# Patient Record
Sex: Female | Born: 1940 | Race: White | Hispanic: No | Marital: Married | Smoking: Never smoker
Health system: Southern US, Community
[De-identification: ages and names within clinical notes are randomized; demographics above are authoritative.]

## PROBLEM LIST (undated history)

## (undated) DIAGNOSIS — K219 Gastro-esophageal reflux disease without esophagitis: Secondary | ICD-10-CM

## (undated) DIAGNOSIS — I10 Essential (primary) hypertension: Secondary | ICD-10-CM

## (undated) DIAGNOSIS — I4891 Unspecified atrial fibrillation: Secondary | ICD-10-CM

## (undated) DIAGNOSIS — J45909 Unspecified asthma, uncomplicated: Secondary | ICD-10-CM

## (undated) HISTORY — DX: Unspecified asthma, uncomplicated: J45.909

## (undated) HISTORY — DX: Essential (primary) hypertension: I10

## (undated) HISTORY — DX: Gastro-esophageal reflux disease without esophagitis: K21.9

---
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
## 2014-07-21 HISTORY — PX: HERNIA REPAIR: SHX51

## 2015-08-23 DIAGNOSIS — N950 Postmenopausal bleeding: Secondary | ICD-10-CM

## 2015-08-23 HISTORY — DX: Postmenopausal bleeding: N95.0

## 2016-07-01 DIAGNOSIS — M7752 Other enthesopathy of left foot: Secondary | ICD-10-CM | POA: Insufficient documentation

## 2016-07-01 HISTORY — DX: Other enthesopathy of left foot and ankle: M77.52

## 2016-07-28 ENCOUNTER — Encounter: Payer: Self-pay | Admitting: Allergy and Immunology

## 2016-07-28 ENCOUNTER — Ambulatory Visit (INDEPENDENT_AMBULATORY_CARE_PROVIDER_SITE_OTHER): Payer: Medicare Other | Admitting: Allergy and Immunology

## 2016-07-28 VITALS — BP 138/78 | HR 90 | Temp 97.8°F | Resp 16 | Ht 62.32 in | Wt 173.4 lb

## 2016-07-28 DIAGNOSIS — K219 Gastro-esophageal reflux disease without esophagitis: Secondary | ICD-10-CM

## 2016-07-28 DIAGNOSIS — J4530 Mild persistent asthma, uncomplicated: Secondary | ICD-10-CM | POA: Diagnosis not present

## 2016-07-28 DIAGNOSIS — J3089 Other allergic rhinitis: Secondary | ICD-10-CM | POA: Diagnosis not present

## 2016-07-28 MED ORDER — AMITRIPTYLINE HCL 10 MG PO TABS
10.0000 mg | ORAL_TABLET | Freq: Every day | ORAL | 5 refills | Status: DC
Start: 2016-07-28 — End: 2016-08-25

## 2016-07-28 MED ORDER — ALBUTEROL SULFATE HFA 108 (90 BASE) MCG/ACT IN AERS: INHALATION_SPRAY | RESPIRATORY_TRACT | 1 refills | Status: DC

## 2016-07-28 NOTE — Progress Notes (Signed)
Dear Dr. Sherral Hammers,  Thank you for referring Karen Snyder to the [facility] [place] on 07/28/2016.   Below is a summation of this patient's evaluation and recommendations.  Thank you for your referral. I will keep you informed about this patient's response to treatment.   If you have any questions please do not hesitate to contact me.   Sincerely,  Karen Priest, MD [facility]   ______________________________________________________________________    NEW PATIENT NOTE  Referring Provider: Hadley Pen, MD Primary Provider: Hadley Pen, MD Date of office visit: 07/28/2016    Subjective:   Chief Complaint:  Karen Snyder (DOB: 10-24-1940) is a 76 y.o. female who presents to the clinic on 07/28/2016 with a chief complaint of Cough (wheezing, SOB x 1 year) and Sinus Problem (clear drainage) .     HPI: Karen Snyder presents to this clinic in evaluation of cough. She has a history of cough going back many decades for which she was evaluated in this clinic around 2005 and has had evaluation with a pulmonologist, gastroenterologist, and ear nose and throat physician and has had performance of modified barium swallow, imaging of her chest, imaging of her mid airway, evaluation at [facility], and has had therapy with multiple forms of treatment directed against reflux and inflammation which did not help her tremendously. She does think that the administration of amitriptyline did give rise to a decrease in her cough but when escalating the dose of amitriptyline she ended up with gastrointestinal upset and had to discontinue this medication. She believes that she could tolerate 30 mg without any problem but 40 mg gave her GI upset. As well, she sometimes sips pineapple juice which helps her cough somewhat.  Most recently she has been evaluated by Dr. Sherral Hammers who documented wheezing and placed her on an  inhaled steroid and long-acting bronchodilator and gave her prednisone. She did do better on her prednisone but she does not think that the inhaler helped her very much. At this point she is no longer wheezing and her cough is a little bit better as a result of that therapy.  She still continues to cough. She has coughing spells. She has issues with raspy voice and a lump in her throat. She has clear rhinorrhea. These issues do awaken her at nighttime. She does not have any ugly nasal discharge or anosmia nor does she have chest pain or sputum production or hemoptysis or other respiratory tract symptoms. She does have reflux symptoms all the way up into her throat. She can take Prilosec for a few weeks and then she develops GI upset and then she will come off this medication for several days to a week or so and then restart..  She drinks 3 Cokes per day and chocolate a few times per week and occasionally a Tea. She does not drink any alcohol.  Past Medical History:  Diagnosis Date  . GERD (gastroesophageal reflux disease)   . High blood pressure     Past Surgical History:  Procedure Laterality Date  . ADENOIDECTOMY  1946  . HERNIA REPAIR  2016  . TONSILLECTOMY  1946    Allergies as of 07/28/2016   No Known Allergies     Medication List      amLODipine 5 MG tablet Commonly known as:  NORVASC Take 5 mg by mouth daily.   BIOTIN FORTE PO Take 1 tablet by mouth  daily.   calcium-vitamin D 500-200 MG-UNIT tablet Commonly known as:  OSCAL WITH D Take 1 tablet by mouth daily.   Melatonin 5 MG Caps Take 1 capsule by mouth at bedtime.   MULTI-VITAMIN DAILY PO Take 1 tablet by mouth daily.   omeprazole 20 MG capsule Commonly known as:  PRILOSEC Take 20 mg by mouth daily.   PROBIOTIC ACIDOPHILUS PO Take 1 capsule by mouth daily.       Review of systems negative except as noted in HPI / PMHx or noted below:  Review of Systems  Constitutional: Negative.   HENT: Negative.     Eyes: Negative.   Respiratory: Negative.   Cardiovascular: Negative.   Gastrointestinal: Negative.   Genitourinary: Negative.   Musculoskeletal: Negative.   Skin: Negative.   Neurological: Negative.   Endo/Heme/Allergies: Negative.   Psychiatric/Behavioral: Negative.     Family History  Problem Relation Age of Onset  . Eczema Sister     Social History   Social History  . Marital status: Married    Spouse name: N/A  . Number of children: N/A  . Years of education: N/A   Occupational History  . Not on file.   Social History Main Topics  . Smoking status: Never Smoker  . Smokeless tobacco: Never Used  . Alcohol use No  . Drug use: No  . Sexual activity: Not on file   Other Topics Concern  . Not on file   Social History Narrative  . No narrative on file    Environmental and Social history  He lives in a house with a dry environment, a dog located inside the household, no carpeting in the bedroom, no plastic on the bed or pillow, and no smoking ongoing with inside the household. She is a retired Runner, broadcasting/film/video.  Objective:   Vitals:   07/28/16 1345  BP: 138/78  Pulse: 90  Resp: 16  Temp: 97.8 F (36.6 C)   Height: 5' 2.32" (158.3 cm) Weight: 173 lb 6.4 oz (78.7 kg)  Physical Exam  Constitutional: She is well-developed, well-nourished, and in no distress.  Coughing  HENT:  Head: Normocephalic.  Right Ear: Tympanic membrane, external ear and ear canal normal.  Left Ear: Tympanic membrane, external ear and ear canal normal.  Nose: Nose normal. No mucosal edema or rhinorrhea.  Mouth/Throat: Uvula is midline, oropharynx is clear and moist and mucous membranes are normal. No oropharyngeal exudate.  Eyes: Conjunctivae are normal.  Neck: Trachea normal. No tracheal tenderness present. No tracheal deviation present. No thyromegaly present.  Cardiovascular: Normal rate, regular rhythm, S1 normal, S2 normal and normal heart sounds.   No murmur  heard. Pulmonary/Chest: Breath sounds normal. No stridor. No respiratory distress. She has no wheezes. She has no rales.  Musculoskeletal: She exhibits no edema.  Lymphadenopathy:       Head (right side): No tonsillar adenopathy present.       Head (left side): No tonsillar adenopathy present.    She has no cervical adenopathy.  Neurological: She is alert. Gait normal.  Skin: No rash noted. She is not diaphoretic. No erythema. Nails show no clubbing.  Psychiatric: Mood and affect normal.    Diagnostics: Allergy skin tests were not performed.   Spirometry was performed and demonstrated an FEV1 of 2.08 @ 111 % of predicted. Following the administration of nebulized albuterol her FEV1 did not improve.  Assessment and Plan:    1. Asthma, well controlled, mild persistent   2. LPRD (laryngopharyngeal reflux disease)  3. Other allergic rhinitis     1. Treat reflux:   A. consolidate caffeine and chocolate consumption as much as possible slowly  B. samples of Dexilant 30 mg capsule in a.m.  C. OTC ranitidine 150 mg in PM  2. Treat inflammation:   A. sample Asmanex 200 HFA - 2 inhalations one time per day with spacer  B. OTC Rhinocort - one spray each nostril once a day  3. Decrease cough reflex:   A. amitriptyline 10 mg tablet at bedtime  4. If needed:   A. Proventil HFA 2 puffs every 4-6 hours  B. OTC antihistamine  C. OTC Mucinex DM  5. 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 We will treat her with the therapy mentioned above and make a decision about further evaluation and treatment pending her response over the course of the next 4 weeks.  Karen PriestEric J. Taren Dymek, MD [facility] [place]

## 2016-07-28 NOTE — Patient Instructions (Addendum)
  1. Treat reflux:   A. consolidate caffeine and chocolate consumption as much as possible slowly  B. samples of Dexilant 30 mg capsule in a.m.  C. OTC ranitidine 150 mg in PM  2. Treat inflammation:   A. sample Asmanex 200 HFA - 2 inhalations one time per day with spacer  B. OTC Rhinocort - one spray each nostril once a day  3. Decrease cough reflex:   A. amitriptyline 10 mg tablet at bedtime  4. If needed:   A. Proventil HFA 2 puffs every 4-6 hours  B. OTC antihistamine  C. OTC Mucinex DM  5. Return to clinic in 4 weeks or earlier if problem

## 2016-08-25 ENCOUNTER — Ambulatory Visit (INDEPENDENT_AMBULATORY_CARE_PROVIDER_SITE_OTHER): Payer: Medicare Other | Admitting: Allergy and Immunology

## 2016-08-25 ENCOUNTER — Encounter: Payer: Self-pay | Admitting: Allergy and Immunology

## 2016-08-25 VITALS — BP 128/76 | HR 100 | Resp 20

## 2016-08-25 DIAGNOSIS — J4530 Mild persistent asthma, uncomplicated: Secondary | ICD-10-CM

## 2016-08-25 DIAGNOSIS — J3089 Other allergic rhinitis: Secondary | ICD-10-CM | POA: Diagnosis not present

## 2016-08-25 DIAGNOSIS — K219 Gastro-esophageal reflux disease without esophagitis: Secondary | ICD-10-CM

## 2016-08-25 MED ORDER — DEXLANSOPRAZOLE 30 MG PO CPDR: DELAYED_RELEASE_CAPSULE | ORAL | 5 refills | Status: DC

## 2016-08-25 MED ORDER — MOMETASONE FUROATE 200 MCG/ACT IN AERO: 2.0000 | INHALATION_SPRAY | Freq: Every day | RESPIRATORY_TRACT | 5 refills | Status: DC

## 2016-08-25 MED ORDER — RANITIDINE HCL 150 MG PO TABS: ORAL_TABLET | ORAL | 5 refills | Status: DC

## 2016-08-25 MED ORDER — AMITRIPTYLINE HCL 10 MG PO TABS: ORAL_TABLET | ORAL | 5 refills | Status: DC

## 2016-08-25 NOTE — Patient Instructions (Addendum)
  1. Continue to Treat reflux:   A. consolidate caffeine and chocolate consumption    B. Dexilant 30 mg capsule in a.m.  C. increase ranitidine 150 mg 2 tablets in PM (script)  2. Continue to Treat inflammation:   A. Asmanex 200 HFA - 2 inhalations 1 time per day with spacer  B. OTC Rhinocort - one spray each nostril once a day  3. Continue to Decrease cough reflex:   A. increase amitriptyline 10 mg tablet 2 tablets at bedtime  4. If needed:   A. Proventil HFA 2 puffs every 4-6 hours  B. OTC antihistamine  C. OTC Mucinex DM  5. Return to clinic in 8 weeks or earlier if problem

## 2016-08-25 NOTE — Progress Notes (Signed)
Follow-up Note  Referring Provider: Hadley Pen, MD Primary Provider: Hadley Pen, MD Date of Office Visit: 08/25/2016  Subjective:   Karen Snyder (DOB: 06-18-1941) is a 76 y.o. female who [facility] on 08/25/2016 in re-evaluation of the following:  HPI: Karen Snyder returns to this clinic in reevaluation of her multifactorial form of cough believed secondary to a combination of reflux-induced respiratory disease, allergic rhinitis, and possible component of asthma. I last saw her in this clinic in a January 2018 at which time we established a plan to address each one of these issues.  She has resolved her coughing fits although she still has a little bit of a daily cough. She has no problems with her throat at this point in time. She has no reflux symptoms. She does not need to use a bronchodilator.  Karen Snyder been very good about consistently using her medical therapy as previously prescribed which includes the Dexilant and ranitidine, Asmanex and Rhinocort, and amitriptyline at bedtime. She has not been as good about consolidating her caffeine consumption and she still continues to drink cokes about 3 times per day and occasionally tea when she goes out. She has consolidated her chocolate. She has raised the head of the bed.  Allergies as of 08/25/2016   No Known Allergies     Medication List      albuterol 108 (90 Base) MCG/ACT inhaler Commonly known as:  PROVENTIL HFA Inhale two puffs every four to six hours as needed for cough or wheeze.   amitriptyline 10 MG tablet Commonly known as:  ELAVIL Take 1 tablet (10 mg total) by mouth at bedtime.   amLODipine 5 MG tablet Commonly known as:  NORVASC Take 5 mg by mouth daily.   ASMANEX HFA 200 MCG/ACT Aero Generic drug:  Mometasone Furoate Inhale 2 puffs into the lungs daily.   BIOTIN FORTE PO Take 1 tablet by mouth daily.   calcium-vitamin D 500-200 MG-UNIT tablet Commonly known as:   OSCAL WITH D Take 1 tablet by mouth daily.   DEXILANT 60 MG capsule Generic drug:  dexlansoprazole Take 60 mg by mouth every morning.   Melatonin 5 MG Caps Take 1 capsule by mouth at bedtime.   MULTI-VITAMIN DAILY PO Take 1 tablet by mouth daily.   PROBIOTIC ACIDOPHILUS PO Take 1 capsule by mouth daily.   ranitidine 150 MG tablet Commonly known as:  ZANTAC Take 150 mg by mouth at bedtime.   RHINOCORT ALLERGY 32 MCG/ACT nasal spray Generic drug:  budesonide Place 1 spray into both nostrils daily.       Past Medical History:  Diagnosis Date  . Asthma   . GERD (gastroesophageal reflux disease)   . High blood pressure     Past Surgical History:  Procedure Laterality Date  . ADENOIDECTOMY  1946  . HERNIA REPAIR  2016  . TONSILLECTOMY  1946    Review of systems negative except as noted in HPI / PMHx or noted below:  Review of Systems  Constitutional: Negative.   HENT: Negative.   Eyes: Negative.   Respiratory: Negative.   Cardiovascular: Negative.   Gastrointestinal: Negative.   Genitourinary: Negative.   Musculoskeletal: Negative.   Skin: Negative.   Neurological: Negative.   Endo/Heme/Allergies: Negative.   Psychiatric/Behavioral: Negative.      Objective:   Vitals:   08/25/16 1142  BP: 128/76  Pulse: 100  Resp: 20          Physical  Exam  Constitutional: She is well-developed, well-nourished, and in no distress.  HENT:  Head: Normocephalic.  Right Ear: Tympanic membrane, external ear and ear canal normal.  Left Ear: Tympanic membrane, external ear and ear canal normal.  Nose: Nose normal. No mucosal edema or rhinorrhea.  Mouth/Throat: Uvula is midline, oropharynx is clear and moist and mucous membranes are normal. No oropharyngeal exudate.  Eyes: Conjunctivae are normal.  Neck: Trachea normal. No tracheal tenderness present. No tracheal deviation present. No thyromegaly present.  Cardiovascular: Normal rate, regular rhythm, S1 normal, S2  normal and normal heart sounds.   No murmur heard. Pulmonary/Chest: Breath sounds normal. No stridor. No respiratory distress. She has no wheezes. She has no rales.  Musculoskeletal: She exhibits no edema.  Lymphadenopathy:       Head (right side): No tonsillar adenopathy present.       Head (left side): No tonsillar adenopathy present.    She has no cervical adenopathy.  Neurological: She is alert. Gait normal.  Skin: No rash noted. She is not diaphoretic. No erythema. Nails show no clubbing.  Psychiatric: Mood and affect normal.    Diagnostics:    Spirometry was performed and demonstrated an FEV1 of 1.53 at 82 % of predicted.  Assessment and Plan:   1. Asthma, well controlled, mild persistent   2. Other allergic rhinitis   3. LPRD (laryngopharyngeal reflux disease)     1. Continue to Treat reflux:   A. consolidate caffeine and chocolate consumption    B. Dexilant 30 mg capsule in a.m.  C. increase ranitidine 150 mg 2 tablets in PM (script)  2. Continue to Treat inflammation:   A. Asmanex 200 HFA - 2 inhalations 1 time per day with spacer  B. OTC Rhinocort - one spray each nostril once a day  3. Continue to Decrease cough reflex:   A. increase amitriptyline 10 mg tablet 2 tablets at bedtime  4. If needed:   A. Proventil HFA 2 puffs every 4-6 hours  B. OTC antihistamine  C. OTC Mucinex DM  5. Return to clinic in 8 weeks or earlier if problem  Karen Snyder's initial response to medical therapy over the course the past 4 weeks has been favorable. I think we are beginning to make some headway regarding her chronic cough. She will continue to use the therapy mentioned above with some slight increases in her therapy for reflux and for cough suppression. I'll see her back in this clinic in 8 weeks and we will make a determination about how to proceed at that point pending her response. She'll contact me during the interval should there be a significant problem.  Karen SchimkeEric Gianna Calef,  MD Allergy / Immunology [facility]

## 2016-08-28 ENCOUNTER — Other Ambulatory Visit: Payer: Self-pay | Admitting: *Deleted

## 2016-08-28 MED ORDER — FLUTICASONE PROPIONATE HFA 110 MCG/ACT IN AERO: INHALATION_SPRAY | RESPIRATORY_TRACT | 5 refills | Status: DC

## 2016-09-01 ENCOUNTER — Telehealth: Payer: Self-pay

## 2016-09-01 NOTE — Telephone Encounter (Signed)
Pharmacy called, Asmanex not covered. Alternatives include  . Dulera  Tier 3 .  Pulmicort  Tier 3 .  Symbicort  Tier 3  Change?

## 2016-09-01 NOTE — Telephone Encounter (Signed)
Please provide patient Pulmicort 180 two inhalations one time per day. Will require instruction on use.

## 2016-09-01 NOTE — Telephone Encounter (Signed)
Already changed patient to Flovent on 02/08/018. Rx sent

## 2016-10-20 ENCOUNTER — Ambulatory Visit (INDEPENDENT_AMBULATORY_CARE_PROVIDER_SITE_OTHER): Payer: Medicare Other | Admitting: Allergy and Immunology

## 2016-10-20 ENCOUNTER — Encounter: Payer: Self-pay | Admitting: Allergy and Immunology

## 2016-10-20 VITALS — BP 130/78 | HR 100 | Resp 18

## 2016-10-20 DIAGNOSIS — J3089 Other allergic rhinitis: Secondary | ICD-10-CM

## 2016-10-20 DIAGNOSIS — K219 Gastro-esophageal reflux disease without esophagitis: Secondary | ICD-10-CM

## 2016-10-20 DIAGNOSIS — J4530 Mild persistent asthma, uncomplicated: Secondary | ICD-10-CM | POA: Diagnosis not present

## 2016-10-20 NOTE — Patient Instructions (Addendum)
  1. Continue to Treat reflux:   A. consolidate caffeine and chocolate consumption    B. Dexilant 30 mg capsule in a.m.  C. ranitidine 150 mg 2 tablets in PM   2. Continue to Treat inflammation:   A. Asmanex 200 HFA - 2 inhalations 1 time per day with spacer  B. OTC Rhinocort - one spray each nostril once a day  C. prednisone 10 mg tablet 1 time per day for 10 days only  3. Continue to Decrease cough reflex:   A. amitriptyline 10 mg tablet 2 tablets at bedtime  4. If needed:   A. Proventil HFA 2 puffs every 4-6 hours  B. OTC antihistamine  C. OTC Mucinex DM  5. Return to clinic in Summer 2018 or earlier if problem

## 2016-10-20 NOTE — Progress Notes (Signed)
Follow-up Note  Referring Provider: Hadley Pen, MD Primary Provider: Hadley Pen, MD Date of Office Visit: 10/20/2016  Subjective:   Karen Snyder (DOB: 1940-08-09) is a 76 y.o. female who [facility] the [facility] on 10/20/2016 in re-evaluation of the following:  HPI: Karen Snyder presents to this clinic in reevaluation of her asthma, allergic rhinitis, and reflux-induced respiratory disease. I last saw her in his clinic in February 2018.  She did very well regarding all of her respiratory tract symptoms and had no cough until she and her husband contracted a viral respiratory tract infection with nasal congestion and rhinorrhea and coughing. Although she is dramatically improved from that event she still has a little bit of lingering cough over the course of the past 2-3 weeks. She's never had a fever and no ugly nasal discharge or sputum production or chest pain.  Prior to this point she was doing wonderful and was not consuming any caffeine and was consistently treating her reflux and consistently using anti-inflammatory medications for both her upper and lower airway.  Allergies as of 10/20/2016   No Known Allergies     Medication List      albuterol 108 (90 Base) MCG/ACT inhaler Commonly known as:  PROVENTIL HFA Inhale two puffs every four to six hours as needed for cough or wheeze.   amitriptyline 10 MG tablet Commonly known as:  ELAVIL Take two tablets at bedtime daily   amLODipine 5 MG tablet Commonly known as:  NORVASC Take 5 mg by mouth daily.   ASMANEX HFA 200 MCG/ACT Aero Generic drug:  Mometasone Furoate Inhale 2 puffs into the lungs daily. Rinse, gargle, and spit after use.   BIOTIN FORTE PO Take 1 tablet by mouth daily.   calcium-vitamin D 500-200 MG-UNIT tablet Commonly known as:  OSCAL WITH D Take 1 tablet by mouth daily.   Dexlansoprazole 30 MG capsule Commonly known as:  DEXILANT Take one capsule every morning before  breakfast   Melatonin 5 MG Caps Take 1 capsule by mouth at bedtime.   MULTI-VITAMIN DAILY PO Take 1 tablet by mouth daily.   PROBIOTIC ACIDOPHILUS PO Take 1 capsule by mouth daily.   ranitidine 150 MG tablet Commonly known as:  ZANTAC Take two tablets each evening as directed   RHINOCORT ALLERGY 32 MCG/ACT nasal spray Generic drug:  budesonide Place 1 spray into both nostrils daily.       Past Medical History:  Diagnosis Date  . Asthma   . GERD (gastroesophageal reflux disease)   . High blood pressure     Past Surgical History:  Procedure Laterality Date  . ADENOIDECTOMY  1946  . HERNIA REPAIR  2016  . TONSILLECTOMY  1946    Review of systems negative except as noted in HPI / PMHx or noted below:  Review of Systems  Constitutional: Negative.   HENT: Negative.   Eyes: Negative.   Respiratory: Negative.   Cardiovascular: Negative.   Gastrointestinal: Negative.   Genitourinary: Negative.   Musculoskeletal: Negative.   Skin: Negative.   Neurological: Negative.   Endo/Heme/Allergies: Negative.   Psychiatric/Behavioral: Negative.      Objective:   Vitals:   10/20/16 1104  BP: 130/78  Pulse: 100  Resp: 18          Physical Exam  Constitutional: She is well-developed, well-nourished, and in no distress.  HENT:  Head: Normocephalic.  Right Ear: Tympanic membrane, external ear and ear canal normal.  Left Ear: Tympanic  membrane, external ear and ear canal normal.  Nose: Nose normal. No mucosal edema or rhinorrhea.  Mouth/Throat: Uvula is midline, oropharynx is clear and moist and mucous membranes are normal. No oropharyngeal exudate.  Eyes: Conjunctivae are normal.  Neck: Trachea normal. No tracheal tenderness present. No tracheal deviation present. No thyromegaly present.  Cardiovascular: Normal rate, regular rhythm, S1 normal, S2 normal and normal heart sounds.   No murmur heard. Pulmonary/Chest: Breath sounds normal. No stridor. No respiratory  distress. She has no wheezes. She has no rales.  Musculoskeletal: She exhibits no edema.  Lymphadenopathy:       Head (right side): No tonsillar adenopathy present.       Head (left side): No tonsillar adenopathy present.    She has no cervical adenopathy.  Neurological: She is alert. Gait normal.  Skin: No rash noted. She is not diaphoretic. No erythema. Nails show no clubbing.  Psychiatric: Mood and affect normal.    Diagnostics:    Spirometry was performed and demonstrated an FEV1 of 1.43 at 76 % of predicted  Assessment and Plan:   1. Asthma, well controlled, mild persistent   2. Other allergic rhinitis   3. LPRD (laryngopharyngeal reflux disease)     1. Continue to Treat reflux:   A. consolidate caffeine and chocolate consumption    B. Dexilant 30 mg capsule in a.m.  C. ranitidine 150 mg 2 tablets in PM   2. Continue to Treat inflammation:   A. Asmanex 200 HFA - 2 inhalations 1 time per day with spacer  B. OTC Rhinocort - one spray each nostril once a day  C. prednisone 10 mg tablet 1 time per day for 10 days only  3. Continue to Decrease cough reflex:   A. amitriptyline 10 mg tablet 2 tablets at bedtime  4. If needed:   A. Proventil HFA 2 puffs every 4-6 hours  B. OTC antihistamine  C. OTC Mucinex DM  5. Return to clinic in Summer 2018 or earlier if problem  Karen Snyder appears to be doing very good except for the fact that she did contract a viral respiratory tract infection and I will give her some systemic steroids to address this issue utilizing a very low dose over the course the next 10 days. She'll continue to treat her reflux aggressively and also continue on anti-inflammatory medications for her respiratory tract utilized in a preventative basis and also uses amitriptyline to address her increased cough reflex. I'll see her back in this clinic in the summer of 2018 or earlier if there is a problem.  Laurette Schimke, MD Allergy / Immunology [facility]

## 2017-02-09 ENCOUNTER — Ambulatory Visit: Payer: Medicare Other | Admitting: Allergy and Immunology

## 2017-02-18 ENCOUNTER — Ambulatory Visit (INDEPENDENT_AMBULATORY_CARE_PROVIDER_SITE_OTHER): Payer: Medicare Other | Admitting: Allergy and Immunology

## 2017-02-18 ENCOUNTER — Encounter: Payer: Self-pay | Admitting: Allergy and Immunology

## 2017-02-18 VITALS — BP 126/74 | HR 92 | Resp 16

## 2017-02-18 DIAGNOSIS — K219 Gastro-esophageal reflux disease without esophagitis: Secondary | ICD-10-CM

## 2017-02-18 DIAGNOSIS — J4530 Mild persistent asthma, uncomplicated: Secondary | ICD-10-CM | POA: Diagnosis not present

## 2017-02-18 DIAGNOSIS — J3089 Other allergic rhinitis: Secondary | ICD-10-CM | POA: Diagnosis not present

## 2017-02-18 NOTE — Progress Notes (Signed)
Follow-up Note  Referring Provider: Hadley Penobbins, Robert A, MD Primary Provider: Hadley Penobbins, Robert A, MD Date of Office Visit: 02/18/2017  Subjective:   Karen Snyder (DOB: Dec 07, 1940) is a 76 y.o. female who [facility] in re-evaluation of the following:  HPI:  Karen Sineancy presents to this clinic in reevaluation of her asthma and allergic rhinitis and reflux-induced respiratory disease. Her last visit to this clinic was February 2018.  Although she has improved significantly while treating inflammation of her respiratory tract and reflux-induced respiratory disease he still has some lingering issues. She still wakes up in the morning and has a morning cough to clear out her chest phlegm. As well, she was out of Asmanex for about 10 days and she developed significant cough during that timeframe and since she has restarted her Asmanex over the course of the past 10 days she has noticed a dramatic decrease in her cough. She has no classic reflux symptoms and no problems with her throat or nose at this point. She does have a diminished ability to exercise in that she does get short of breath.  She did have problems with feet swelling for which she visited with Dr. Sherral Hammersobbins who performed blood tests and apparently everything was okay. She is on amlodipine. She has no symptoms to suggest significant congestive heart failure.  Allergies as of 02/18/2017   No Known Allergies     Medication List      albuterol 108 (90 Base) MCG/ACT inhaler Commonly known as:  PROVENTIL HFA Inhale two puffs every four to six hours as needed for cough or wheeze.   amitriptyline 10 MG tablet Commonly known as:  ELAVIL Take two tablets at bedtime daily   amLODipine 5 MG tablet Commonly known as:  NORVASC Take 5 mg by mouth daily.   ASMANEX HFA 200 MCG/ACT Aero Generic drug:  Mometasone Furoate Inhale 2 puffs into the lungs daily. Rinse, gargle, and spit after use.   BIOTIN  FORTE PO Take 1 tablet by mouth daily.   calcium-vitamin D 500-200 MG-UNIT tablet Commonly known as:  OSCAL WITH D Take 1 tablet by mouth daily.   Dexlansoprazole 30 MG capsule Commonly known as:  DEXILANT Take one capsule every morning before breakfast   MULTI-VITAMIN DAILY PO Take 1 tablet by mouth daily.   PROBIOTIC ACIDOPHILUS PO Take 1 capsule by mouth daily.   ranitidine 150 MG tablet Commonly known as:  ZANTAC Take two tablets each evening as directed   RHINOCORT ALLERGY 32 MCG/ACT nasal spray Generic drug:  budesonide Place 1 spray into both nostrils daily.       Past Medical History:  Diagnosis Date  . Asthma   . GERD (gastroesophageal reflux disease)   . High blood pressure     Past Surgical History:  Procedure Laterality Date  . ADENOIDECTOMY  1946  . HERNIA REPAIR  2016  . TONSILLECTOMY  1946    Review of systems negative except as noted in HPI / PMHx or noted below:  Review of Systems  Constitutional: Negative.   HENT: Negative.   Eyes: Negative.   Respiratory: Negative.   Cardiovascular: Negative.   Gastrointestinal: Negative.   Genitourinary: Negative.   Musculoskeletal: Negative.   Skin: Negative.   Neurological: Negative.   Endo/Heme/Allergies: Negative.   Psychiatric/Behavioral: Negative.      Objective:   Vitals:   02/18/17 1514  BP: 126/74  Pulse: 92  Resp: 16  Physical Exam  Constitutional: She is well-developed, well-nourished, and in no distress.  Snyder throat clearing, Snyder cough  HENT:  Head: Normocephalic.  Right Ear: Tympanic membrane, external ear and ear canal normal.  Left Ear: Tympanic membrane, external ear and ear canal normal.  Nose: Nose normal. No mucosal edema or rhinorrhea.  Mouth/Throat: Uvula is midline, oropharynx is clear and moist and mucous membranes are normal. No oropharyngeal exudate.  Eyes: Conjunctivae are normal.  Neck: Trachea normal. No tracheal tenderness present. No  tracheal deviation present. No thyromegaly present.  Cardiovascular: Normal rate, regular rhythm, S1 normal, S2 normal and normal heart sounds.   No murmur heard. Pulmonary/Chest: Breath sounds normal. No stridor. No respiratory distress. She has no wheezes. She has no rales.  Musculoskeletal: She exhibits no edema.  Lymphadenopathy:       Head (right side): No tonsillar adenopathy present.       Head (left side): No tonsillar adenopathy present.    She has no cervical adenopathy.  Neurological: She is alert. Gait normal.  Skin: No rash noted. She is not diaphoretic. No erythema. Nails show no clubbing.  Psychiatric: Mood and affect normal.    Diagnostics:    Spirometry was performed and demonstrated an FEV1 of 1.45 at 78 % of predicted.  The patient had an Asthma Control Test with the following results: ACT Total Score: 24.    Assessment and Plan:   1. Not well controlled mild persistent asthma   2. Other allergic rhinitis   3. LPRD (laryngopharyngeal reflux disease)     1. Continue to Treat reflux:   A. consolidate caffeine and chocolate consumption    B. Dexilant 30 mg capsule in a.m.  C. ranitidine 150 mg 2 tablets in PM   2. Continue to Treat inflammation:   A. Increase Asmanex 200 HFA - 2 inhalations 2 time per day with spacer  B. OTC Rhinocort - one spray each nostril once a day  3. Continue to Decrease cough reflex:   A. amitriptyline 10 mg tablet 2 tablets at bedtime  4. If needed:   A. Proventil HFA 2 puffs every 4-6 hours  B. OTC antihistamine  C. OTC Mucinex DM  5. Return to clinic in 12 weeks or earlier if problem  6. Obtain fall flu vaccine  I'm going to have Karen Snyder increase her dose of Asmanex assuming that there is a inflammatory component that has not been addressed that gives rise to some of her lingering respiratory tract symptoms. This assumption is somewhat supported by the fact that when she stopped her Asmanex for about 2 weeks she develop  significant cough. She will continue to use other therapy directed against respiratory tract inflammation and reflux. I will regroup with her in 12 weeks or earlier if there is a problem.  Laurette SchimkeEric Kozlow, MD Allergy / Immunology [facility]

## 2017-02-18 NOTE — Patient Instructions (Addendum)
  1. Continue to Treat reflux:   A. consolidate caffeine and chocolate consumption    B. Dexilant 30 mg capsule in a.m.  C. ranitidine 150 mg 2 tablets in PM   2. Continue to Treat inflammation:   A. Increase Asmanex 200 HFA - 2 inhalations 2 time per day with spacer  B. OTC Rhinocort - one spray each nostril once a day  3. Continue to Decrease cough reflex:   A. amitriptyline 10 mg tablet 2 tablets at bedtime  4. If needed:   A. Proventil HFA 2 puffs every 4-6 hours  B. OTC antihistamine  C. OTC Mucinex DM  5. Return to clinic in 12 weeks or earlier if problem  6. Obtain fall flu vaccine

## 2017-02-21 ENCOUNTER — Other Ambulatory Visit: Payer: Self-pay | Admitting: Allergy and Immunology

## 2017-03-19 ENCOUNTER — Telehealth: Payer: Self-pay | Admitting: *Deleted

## 2017-03-19 NOTE — Telephone Encounter (Signed)
Patient made appt to come in to see with Dr Lucie LeatherKozlow in 1 couple weeks but having some nausea issues mainly in the am thinks maybe reflux med.  Per discussion she is going D/C the ranitidine at night an see if hser symptoms resolve.  She advises that she had to D/C her amitriptyline due to caused her feet to swell.  She stopped the medication for 1 week and swelling stopped and when she restarted Rx swelling started again.

## 2017-04-09 ENCOUNTER — Ambulatory Visit (INDEPENDENT_AMBULATORY_CARE_PROVIDER_SITE_OTHER): Payer: Medicare Other | Admitting: Allergy and Immunology

## 2017-04-09 ENCOUNTER — Encounter: Payer: Self-pay | Admitting: Allergy and Immunology

## 2017-04-09 VITALS — BP 138/82 | HR 84 | Resp 20

## 2017-04-09 DIAGNOSIS — K219 Gastro-esophageal reflux disease without esophagitis: Secondary | ICD-10-CM

## 2017-04-09 DIAGNOSIS — J3089 Other allergic rhinitis: Secondary | ICD-10-CM | POA: Diagnosis not present

## 2017-04-09 DIAGNOSIS — J4530 Mild persistent asthma, uncomplicated: Secondary | ICD-10-CM

## 2017-04-09 MED ORDER — GABAPENTIN 100 MG PO CAPS: 100.0000 mg | ORAL_CAPSULE | Freq: Two times a day (BID) | ORAL | 3 refills | Status: DC

## 2017-04-09 NOTE — Progress Notes (Signed)
Follow-up Note  Referring Provider: Hadley Pen, MD Primary Provider: Hadley Pen, MD Date of Office Visit: 04/09/2017  Subjective:   Karen Snyder (DOB: [birth year]) is a 76 y.o. female who [facility] in re-evaluation of the following:  HPI: Karen Snyder returns to this clinic in reevaluation of her persistent cough. Her last visit to this clinic was 02/18/2017 at which point in time she had very good control of her cough while consistently using a combination of therapy directed against inflammation and reflux and using amitriptyline as a cough suppressant.  Unfortunately, she had to discontinue her amitriptyline because it was giving rise to hair loss and leg swelling. As well, she had to discontinue her ranitidine because it was giving rise to abdominal pain and burning in her chest. Since she discontinued her amitriptyline and ranitidine these side effects have resolved. However, since she discontinued her amitriptyline and ranitidine she has developed very bad cough associated with posttussive emesis. She has lots of throat clearing and feels as though there is phlegm stuck in her throat.  Allergies as of 04/09/2017   No Known Allergies     Medication List      albuterol 108 (90 Base) MCG/ACT inhaler Commonly known as:  PROVENTIL HFA Inhale two puffs every four to six hours as needed for cough or wheeze.   amitriptyline 10 MG tablet Commonly known as:  ELAVIL TAKE 2 TABLETS DAILY AT BEDTIME.   amLODipine 5 MG tablet Commonly known as:  NORVASC Take 5 mg by mouth daily.   ASMANEX HFA 200 MCG/ACT Aero Generic drug:  Mometasone Furoate Inhale 2 puffs into the lungs daily. Rinse, gargle, and spit after use.   BIOTIN FORTE PO Take 1 tablet by mouth daily.   calcium-vitamin D 500-200 MG-UNIT tablet Commonly known as:  OSCAL WITH D Take 1 tablet by mouth daily.   DEXILANT 30 MG capsule Generic drug:  Dexlansoprazole TAKE  1 CAPSULE EVERY MORNING BEFORE BREAKFAST   dextromethorphan-guaiFENesin 30-600 MG 12hr tablet Commonly known as:  MUCINEX DM Take by mouth.   MULTI-VITAMIN DAILY PO Take 1 tablet by mouth daily.   PROBIOTIC ACIDOPHILUS PO Take 1 capsule by mouth daily.   ranitidine 150 MG tablet Commonly known as:  ZANTAC Take two tablets each evening as directed   RHINOCORT ALLERGY 32 MCG/ACT nasal spray Generic drug:  budesonide Place 1 spray into both nostrils daily.       Past Medical History:  Diagnosis Date  . Asthma   . GERD (gastroesophageal reflux disease)   . High blood pressure     Past Surgical History:  Procedure Laterality Date  . ADENOIDECTOMY  1946  . HERNIA REPAIR  2016  . TONSILLECTOMY  1946    Review of systems negative except as noted in HPI / PMHx or noted below:  Review of Systems  Constitutional: Negative.   HENT: Negative.   Eyes: Negative.   Respiratory: Negative.   Cardiovascular: Negative.   Gastrointestinal: Negative.   Genitourinary: Negative.   Musculoskeletal: Negative.   Skin: Negative.   Neurological: Negative.   Endo/Heme/Allergies: Negative.   Psychiatric/Behavioral: Negative.      Objective:   Vitals:   04/09/17 1544  BP: 138/82  Pulse: 84  Resp: 20          Physical Exam  Constitutional: She is well-developed, well-nourished, and in no distress.  Spasmodic cough  HENT:  Head: Normocephalic.  Right Ear: Tympanic membrane,  external ear and ear canal normal.  Left Ear: Tympanic membrane, external ear and ear canal normal.  Nose: Nose normal. No mucosal edema or rhinorrhea.  Mouth/Throat: Uvula is midline, oropharynx is clear and moist and mucous membranes are normal. No oropharyngeal exudate.  Eyes: Conjunctivae are normal.  Neck: Trachea normal. No tracheal tenderness present. No tracheal deviation present. No thyromegaly present.  Cardiovascular: Normal rate, regular rhythm, S1 normal, S2 normal and normal heart sounds.     No murmur heard. Pulmonary/Chest: Breath sounds normal. No stridor. No respiratory distress. She has no wheezes. She has no rales.  Musculoskeletal: She exhibits no edema.  Lymphadenopathy:       Head (right side): No tonsillar adenopathy present.       Head (left side): No tonsillar adenopathy present.    She has no cervical adenopathy.  Neurological: She is alert. Gait normal.  Skin: No rash noted. She is not diaphoretic. No erythema. Nails show no clubbing.  Psychiatric: Mood and affect normal.    Diagnostics:    Spirometry was performed and demonstrated an FEV1 of 1.44 at 77 % of predicted.  Assessment and Plan:   1. Mild persistent asthma without complication   2. Other allergic rhinitis   3. LPRD (laryngopharyngeal reflux disease)     1. Continue to Treat reflux:   A. consolidate caffeine and chocolate consumption - aim for NONE   B. Increase Dexilant 30 mg capsule to twice a day   2. Continue to Treat inflammation:   A. Asmanex 200 HFA - 2 inhalations 2 time per day with spacer  B. OTC Rhinocort - one spray each nostril once a day  3. If needed:   A. Proventil HFA 2 puffs every 4-6 hours  B. OTC antihistamine  C. OTC Mucinex DM  4. Start gabapentin -  twice a day  5. Return to clinic in 4 weeks  6. Obtain fall flu vaccine  I will assume that Trany will do better with her cough as we are more aggressive about treating her reflux as noted above and also start gabapentin as a cough suppressant. She will continue to use anti-inflammatory agents for her respiratory tract as noted above. I will see her back in this clinic in 4 weeks or earlier if there is a problem.  Laurette Schimke, MD Allergy / Immunology [facility]

## 2017-04-09 NOTE — Patient Instructions (Addendum)
  1. Continue to Treat reflux:   A. consolidate caffeine and chocolate consumption - aim for NONE   B. Increase Dexilant 30 mg capsule to twice a day   2. Continue to Treat inflammation:   A. Asmanex 200 HFA - 2 inhalations 2 time per day with spacer  B. OTC Rhinocort - one spray each nostril once a day  3. If needed:   A. Proventil HFA 2 puffs every 4-6 hours  B. OTC antihistamine  C. OTC Mucinex DM  4. Start gabapentin -  twice a day  5. Return to clinic in 4 weeks  6. Obtain fall flu vaccine

## 2017-05-04 ENCOUNTER — Encounter: Payer: Self-pay | Admitting: Allergy and Immunology

## 2017-05-04 ENCOUNTER — Ambulatory Visit (INDEPENDENT_AMBULATORY_CARE_PROVIDER_SITE_OTHER): Payer: Medicare Other | Admitting: Allergy and Immunology

## 2017-05-04 VITALS — BP 130/78 | HR 88 | Resp 18

## 2017-05-04 DIAGNOSIS — J3089 Other allergic rhinitis: Secondary | ICD-10-CM | POA: Diagnosis not present

## 2017-05-04 DIAGNOSIS — J4530 Mild persistent asthma, uncomplicated: Secondary | ICD-10-CM | POA: Diagnosis not present

## 2017-05-04 DIAGNOSIS — K219 Gastro-esophageal reflux disease without esophagitis: Secondary | ICD-10-CM | POA: Diagnosis not present

## 2017-05-04 MED ORDER — GABAPENTIN 100 MG PO CAPS: ORAL_CAPSULE | ORAL | 3 refills | Status: DC

## 2017-05-04 NOTE — Progress Notes (Signed)
Follow-up Note  Referring Provider: Hadley Pen, MD Primary Provider: Hadley Pen, MD Date of Office Visit: 05/04/2017  Subjective:   Karen Snyder (DOB: [birth year]) is a 76 y.o. female who [facility]/2017 in re-evaluation of the following:  HPI: Karen Snyder returns to this clinic in reevaluation of her persistent cough. I last saw her in his clinic on 04/09/2017 at which point in time we increased her proton pump inhibitor to twice a day and added gabapentin while she continued on inhaled and nasal steroids.  She is not sure that she has really done much better yet. She also felt kind of bad this weekend with some dizziness and just felt  fatigued and didn't have any get up and go although she is much better at this point in time. It should be noted that she does have a new set of glasses which may account for some of her dizziness and unsteadiness.  She is basically the same regarding her cough. The use of gabapentin 100 mgs twice a day did not help.  She has had no problems with reflux. She still continues to drink a caffeinated drink every day and sometimes 2 times a day.  Allergies as of 05/04/2017   No Known Allergies     Medication List      albuterol 108 (90 Base) MCG/ACT inhaler Commonly known as:  PROVENTIL HFA Inhale two puffs every four to six hours as needed for cough or wheeze.   amLODipine 5 MG tablet Commonly known as:  NORVASC Take 5 mg by mouth daily.   ASMANEX HFA 200 MCG/ACT Aero Generic drug:  Mometasone Furoate Inhale 2 puffs into the lungs daily. Rinse, gargle, and spit after use.   BIOTIN FORTE PO Take 1 tablet by mouth daily.   calcium-vitamin D 500-200 MG-UNIT tablet Commonly known as:  OSCAL WITH D Take 1 tablet by mouth daily.   DEXILANT 30 MG capsule Generic drug:  Dexlansoprazole TAKE 1 CAPSULE EVERY MORNING BEFORE BREAKFAST   dextromethorphan-guaiFENesin 30-600 MG 12hr tablet Commonly  known as:  MUCINEX DM Take by mouth.   gabapentin 100 MG capsule Commonly known as:  NEURONTIN Take 1 capsule (100 mg total) by mouth 2 (two) times daily.   MULTI-VITAMIN DAILY PO Take 1 tablet by mouth daily.   PROBIOTIC ACIDOPHILUS PO Take 1 capsule by mouth daily.   RHINOCORT ALLERGY 32 MCG/ACT nasal spray Generic drug:  budesonide Place 1 spray into both nostrils daily.       Past Medical History:  Diagnosis Date  . Asthma   . GERD (gastroesophageal reflux disease)   . High blood pressure     Past Surgical History:  Procedure Laterality Date  . ADENOIDECTOMY  1946  . HERNIA REPAIR  2016  . TONSILLECTOMY  1946    Review of systems negative except as noted in HPI / PMHx or noted below:  Review of Systems  Constitutional: Negative.   HENT: Negative.   Eyes: Negative.   Respiratory: Negative.   Cardiovascular: Negative.   Gastrointestinal: Negative.   Genitourinary: Negative.   Musculoskeletal: Negative.   Skin: Negative.   Neurological: Negative.   Endo/Heme/Allergies: Negative.   Psychiatric/Behavioral: Negative.      Objective:   Vitals:   05/04/17 1527  BP: 130/78  Pulse: 88  Resp: 18          Physical Exam  Constitutional: She is well-developed, well-nourished, and in no distress.  HENT:  Head: Normocephalic.  Right Ear: Tympanic membrane, external ear and ear canal normal.  Left Ear: Tympanic membrane, external ear and ear canal normal.  Nose: Nose normal. No mucosal edema or rhinorrhea.  Mouth/Throat: Uvula is midline, oropharynx is clear and moist and mucous membranes are normal. No oropharyngeal exudate.  Eyes: Conjunctivae are normal.  Neck: Trachea normal. No tracheal tenderness present. No tracheal deviation present. No thyromegaly present.  Cardiovascular: Normal rate, regular rhythm, S1 normal, S2 normal and normal heart sounds.   No murmur heard. Pulmonary/Chest: Breath sounds normal. No stridor. No respiratory distress. She  has no wheezes. She has no rales.  Musculoskeletal: She exhibits no edema.  Lymphadenopathy:       Head (right side): No tonsillar adenopathy present.       Head (left side): No tonsillar adenopathy present.    She has no cervical adenopathy.  Neurological: She is alert. Gait normal.  Skin: No rash noted. She is not diaphoretic. No erythema. Nails show no clubbing.  Psychiatric: Mood and affect normal.    Diagnostics:    Spirometry was performed and demonstrated an FEV1 of 1.41 at 75 % of predicted.  Assessment and Plan:   1. Asthma, well controlled, mild persistent   2. Other allergic rhinitis   3. LPRD (laryngopharyngeal reflux disease)     1. Continue to Treat reflux:   A. consolidate caffeine and chocolate consumption - aim for NONE   B. Dexilant 30 mg capsule to twice a day   2. Continue to Treat inflammation:   A. Asmanex 200 HFA - 2 inhalations 2 time per day with spacer  B. OTC Rhinocort - one spray each nostril once a day  3. If needed:   A. Proventil HFA 2 puffs every 4-6 hours  B. OTC antihistamine  C. OTC Mucinex DM  4. Increase gabapentin - 200 mg twice a day  5. Return to clinic in 4 weeks  6. Obtain fall flu vaccine  [name] we'll now increase her gabapentin initially using 200 mg at night and 100 mg in the morning for the first week and then 200 mg twice a day. She will continue on anti-inflammatory agents for her respiratory tract and aggressive therapy directed against reflux as noted above.I will see her back in this clinic in 4 weeks or earlier if there is a problem.  Laurette Schimke, MD Allergy / Immunology [facility]

## 2017-05-04 NOTE — Patient Instructions (Addendum)
  1. Continue to Treat reflux:   A. consolidate caffeine and chocolate consumption - aim for NONE   B. Dexilant 30 mg capsule to twice a day   2. Continue to Treat inflammation:   A. Asmanex 200 HFA - 2 inhalations 2 time per day with spacer  B. OTC Rhinocort - one spray each nostril once a day  3. If needed:   A. Proventil HFA 2 puffs every 4-6 hours  B. OTC antihistamine  C. OTC Mucinex DM  4. Increase gabapentin -  twice a day  5. Return to clinic in 4 weeks  6. Obtain fall flu vaccine

## 2017-05-14 ENCOUNTER — Ambulatory Visit: Payer: Medicare Other | Admitting: Allergy and Immunology

## 2017-06-01 ENCOUNTER — Ambulatory Visit: Payer: Medicare Other | Admitting: Allergy and Immunology

## 2017-06-01 VITALS — BP 140/80 | HR 80 | Resp 16

## 2017-06-01 DIAGNOSIS — J4530 Mild persistent asthma, uncomplicated: Secondary | ICD-10-CM | POA: Diagnosis not present

## 2017-06-01 DIAGNOSIS — K219 Gastro-esophageal reflux disease without esophagitis: Secondary | ICD-10-CM

## 2017-06-01 DIAGNOSIS — J3089 Other allergic rhinitis: Secondary | ICD-10-CM

## 2017-06-01 MED ORDER — GABAPENTIN 100 MG PO CAPS: ORAL_CAPSULE | ORAL | 3 refills | Status: DC

## 2017-06-01 MED ORDER — DEXLANSOPRAZOLE 30 MG PO CPDR: DELAYED_RELEASE_CAPSULE | ORAL | 5 refills | Status: DC

## 2017-06-01 NOTE — Progress Notes (Signed)
Follow-up Note  Referring Provider: Hadley Penobbins, Robert A, MD Primary Provider: Hadley Penobbins, Robert A, MD Date of Office Visit: 06/01/2017  Subjective:   Karen Snyder (DOB: 17-Feb-1941) is a 76 y.o. female [facility] in re-evaluation of the following:  HPI: Karen Snyder presents to this clinic in evaluation of her cough.  She was last seen in this clinic 04 May 2017 at which point in time we increased her gabapentin to 200 mg twice a day.  She may be a little bit better.  She does not think she has as much cough.  When she lays down at nighttime she has no cough.  She has not been having any reflux and she has not been having any shortness of breath and she has not been having any nasal congestion.  She is tolerating her split dose of Dexilant and has not developed any significant GI issues with this use.  Allergies as of 06/01/2017   No Known Allergies     Medication List      albuterol 108 (90 Base) MCG/ACT inhaler Commonly known as:  PROVENTIL HFA Inhale two puffs every four to six hours as needed for cough or wheeze.   amLODipine 5 MG tablet Commonly known as:  NORVASC Take 5 mg by mouth daily.   ASMANEX HFA 200 MCG/ACT Aero Generic drug:  Mometasone Furoate Inhale 2 puffs into the lungs daily. Rinse, gargle, and spit after use.   BIOTIN FORTE PO Take 1 tablet by mouth daily.   calcium-vitamin D 500-200 MG-UNIT tablet Commonly known as:  OSCAL WITH D Take 1 tablet by mouth daily.   DEXILANT 30 MG capsule Generic drug:  Dexlansoprazole TAKE 1 CAPSULE EVERY MORNING BEFORE BREAKFAST   dextromethorphan-guaiFENesin 30-600 MG 12hr tablet Commonly known as:  MUCINEX DM Take by mouth.   gabapentin 100 MG capsule Commonly known as:  NEURONTIN Take two tablets twice daily as directed   MULTI-VITAMIN DAILY PO Take 1 tablet by mouth daily.   PROBIOTIC ACIDOPHILUS PO Take 1 capsule by mouth daily.   RHINOCORT ALLERGY 32  MCG/ACT nasal spray Generic drug:  budesonide Place 1 spray into both nostrils daily.       Past Medical History:  Diagnosis Date  . Asthma   . GERD (gastroesophageal reflux disease)   . High blood pressure     Past Surgical History:  Procedure Laterality Date  . ADENOIDECTOMY  1946  . HERNIA REPAIR  2016  . TONSILLECTOMY  1946    Review of systems negative except as noted in HPI / PMHx or noted below:  Review of Systems  Constitutional: Negative.   HENT: Negative.   Eyes: Negative.   Respiratory: Negative.   Cardiovascular: Negative.   Gastrointestinal: Negative.   Genitourinary: Negative.   Musculoskeletal: Negative.   Skin: Negative.   Neurological: Negative.   Endo/Heme/Allergies: Negative.   Psychiatric/Behavioral: Negative.      Objective:   Vitals:   06/01/17 1356  BP: 140/80  Pulse: 80  Resp: 16          Physical Exam  Constitutional: She is well-developed, well-nourished, and in no distress.  HENT:  Head: Normocephalic.  Right Ear: Tympanic membrane, external ear and ear canal normal.  Left Ear: Tympanic membrane, external ear and ear canal normal.  Nose: Nose normal. No mucosal edema or rhinorrhea.  Mouth/Throat: Uvula is midline, oropharynx is clear and moist and mucous membranes are normal. No oropharyngeal exudate.  Eyes:  Conjunctivae are normal.  Neck: Trachea normal. No tracheal tenderness present. No tracheal deviation present. No thyromegaly present.  Cardiovascular: Normal rate, regular rhythm, S1 normal, S2 normal and normal heart sounds.  No murmur heard. Pulmonary/Chest: Breath sounds normal. No stridor. No respiratory distress. She has no wheezes. She has no rales.  Musculoskeletal: She exhibits no edema.  Lymphadenopathy:       Head (right side): No tonsillar adenopathy present.       Head (left side): No tonsillar adenopathy present.    She has no cervical adenopathy.  Neurological: She is alert. Gait normal.  Skin: No rash  noted. She is not diaphoretic. No erythema. Nails show no clubbing.  Psychiatric: Mood and affect normal.    Diagnostics:    Spirometry was performed and demonstrated an FEV1 of 1.41 at 75 % of predicted.  Assessment and Plan:   1. Asthma, well controlled, mild persistent   2. Other allergic rhinitis   3. LPRD (laryngopharyngeal reflux disease)     1. Continue to Treat reflux:   A. consolidate caffeine and chocolate consumption - aim for NONE   B. Dexilant 30 mg capsule two twice a day   2. Continue to Treat inflammation:   A. Asmanex 200 HFA - 2 inhalations 2 time per day with spacer  B. OTC Rhinocort - one spray each nostril once a day  3. If needed:   A. Proventil HFA 2 puffs every 4-6 hours  B. OTC antihistamine  C. OTC Mucinex DM  4. Increase gabapentin - 300mg  twice a day  5. Return to clinic in Christmas 2018  Karen Snyder may have received some benefit on her current plan as we increased her gabapentin and will now try her on 300 mg twice a day and I will see her back in his clinic in approximately 6-8 weeks or earlier if there is a problem.  She will continue to utilize therapy directed against inflammation of her airway and reflux as noted above.  Laurette SchimkeEric Yeiren Whitecotton, MD Allergy / Immunology [facility]

## 2017-06-01 NOTE — Patient Instructions (Addendum)
  1. Continue to Treat reflux:   A. consolidate caffeine and chocolate consumption - aim for NONE   B. Dexilant 30 mg capsule two twice a day   2. Continue to Treat inflammation:   A. Asmanex 200 HFA - 2 inhalations 2 time per day with spacer  B. OTC Rhinocort - one spray each nostril once a day  3. If needed:   A. Proventil HFA 2 puffs every 4-6 hours  B. OTC antihistamine  C. OTC Mucinex DM  4. Increase gabapentin - 300mg  twice a day  5. Return to clinic in Christmas 2018

## 2017-07-15 ENCOUNTER — Encounter: Payer: Self-pay | Admitting: Allergy and Immunology

## 2017-07-15 ENCOUNTER — Ambulatory Visit: Payer: Medicare Other | Admitting: Allergy and Immunology

## 2017-07-15 VITALS — BP 124/82 | HR 80 | Resp 20

## 2017-07-15 DIAGNOSIS — J4530 Mild persistent asthma, uncomplicated: Secondary | ICD-10-CM | POA: Diagnosis not present

## 2017-07-15 DIAGNOSIS — K219 Gastro-esophageal reflux disease without esophagitis: Secondary | ICD-10-CM

## 2017-07-15 DIAGNOSIS — J3089 Other allergic rhinitis: Secondary | ICD-10-CM

## 2017-07-15 MED ORDER — MOMETASONE FUROATE 200 MCG/ACT IN AERO: 2.0000 | INHALATION_SPRAY | Freq: Every day | RESPIRATORY_TRACT | 3 refills | Status: DC

## 2017-07-15 MED ORDER — METHYLPREDNISOLONE ACETATE 80 MG/ML IJ SUSP
80.0000 mg | Freq: Once | INTRAMUSCULAR | Status: AC
  Administered 2017-07-15: 80 mg via INTRAMUSCULAR

## 2017-07-15 NOTE — Progress Notes (Signed)
Follow-up Note  Referring Provider: Hadley Penobbins, Robert A, MD Primary Provider: Hadley Penobbins, Robert A, MD Date of Office Visit: 07/15/2017  Subjective:   Karen Snyder (DOB: 1940-08-02) is a 76 y.o. female who [facility] in re-evaluation of the following:  HPI: Karen Snyder returns to this clinic in reevaluation of cough.  She was last seen in this clinic 01 June 2017 at which point in time she was doing relatively well although still continues to have some cough during the daytime and we increased her gabapentin dose.  Unfortunately, several weeks ago she developed a viral respiratory tract infection with lots of cough and head fullness and full ears and was given an antibiotic by her primary care office which may have helped her upper airway and ear issue somewhat but did not really help her cough.  In addition, she thinks that she is developing problems from the use of her higher dose gabapentin with morning dizziness and unsteadiness and just being tired in general.  In addition, she believes that she is developing problems with the use of her Dexilant.  We were able to split her Dexilant to 30 mg twice a day and she did well for a while but now she is starting to develop some intermittent abdominal pain.  Allergies as of 07/15/2017   No Known Allergies     Medication List      albuterol 108 (90 Base) MCG/ACT inhaler Commonly known as:  PROVENTIL HFA Inhale two puffs every four to six hours as needed for cough or wheeze.   amLODipine 5 MG tablet Commonly known as:  NORVASC Take 5 mg by mouth daily.   ASMANEX HFA 200 MCG/ACT Aero Generic drug:  Mometasone Furoate Inhale 2 puffs into the lungs daily. Rinse, gargle, and spit after use.   BIOTIN FORTE PO Take 1 tablet by mouth daily.   calcium-vitamin D 500-200 MG-UNIT tablet Commonly known as:  OSCAL WITH D Take 1 tablet by mouth daily.   Dexlansoprazole 30 MG capsule Commonly known  as:  DEXILANT Take one capsule twice daily as directed.   dextromethorphan-guaiFENesin 30-600 MG 12hr tablet Commonly known as:  MUCINEX DM Take by mouth.   gabapentin 100 MG capsule Commonly known as:  NEURONTIN Take three tablets twice daily as directed   MULTI-VITAMIN DAILY PO Take 1 tablet by mouth daily.   PROBIOTIC ACIDOPHILUS PO Take 1 capsule by mouth daily.   RHINOCORT ALLERGY 32 MCG/ACT nasal spray Generic drug:  budesonide Place 1 spray into both nostrils daily.       Past Medical History:  Diagnosis Date  . Asthma   . GERD (gastroesophageal reflux disease)   . High blood pressure     Past Surgical History:  Procedure Laterality Date  . ADENOIDECTOMY  1946  . HERNIA REPAIR  2016  . TONSILLECTOMY  1946    Review of systems negative except as noted in HPI / PMHx or noted below:  Review of Systems  Constitutional: Negative.   HENT: Negative.   Eyes: Negative.   Respiratory: Negative.   Cardiovascular: Negative.   Gastrointestinal: Negative.   Genitourinary: Negative.   Musculoskeletal: Negative.   Skin: Negative.   Neurological: Negative.   Endo/Heme/Allergies: Negative.   Psychiatric/Behavioral: Negative.      Objective:   Vitals:   07/15/17 1502  BP: 124/82  Pulse: 80  Resp: 20          Physical Exam  Constitutional: She is  well-developed, well-nourished, and in no distress.  Slightly nasal voice  HENT:  Head: Normocephalic.  Right Ear: Tympanic membrane, external ear and ear canal normal.  Left Ear: Tympanic membrane, external ear and ear canal normal.  Nose: Nose normal. No mucosal edema (Erythematous) or rhinorrhea.  Mouth/Throat: Uvula is midline, oropharynx is clear and moist and mucous membranes are normal. No oropharyngeal exudate.  Eyes: Conjunctivae are normal.  Neck: Trachea normal. No tracheal tenderness present. No tracheal deviation present. No thyromegaly present.  Cardiovascular: Normal rate, regular rhythm, S1  normal, S2 normal and normal heart sounds.  No murmur heard. Pulmonary/Chest: Breath sounds normal. No stridor. No respiratory distress. She has no wheezes (Scattered end expiratory wheezes left greater than right posterior lung field). She has no rales.  Musculoskeletal: She exhibits no edema.  Lymphadenopathy:       Head (right side): No tonsillar adenopathy present.       Head (left side): No tonsillar adenopathy present.    She has no cervical adenopathy.  Neurological: She is alert. Gait normal.  Skin: No rash noted. She is not diaphoretic. No erythema. Nails show no clubbing.  Psychiatric: Mood and affect normal.    Diagnostics:    Spirometry was performed and demonstrated an FEV1 of 1.38 at 75 % of predicted.  The patient had an Asthma Control Test with the following results: ACT Total Score: 23.    Assessment and Plan:   1. Asthma, well controlled, mild persistent   2. Other allergic rhinitis   3. LPRD (laryngopharyngeal reflux disease)     1. Continue to Treat reflux:   A. consolidate caffeine and chocolate consumption - aim for NONE   B. DECREASE Dexilant 30 mg capsule ONCE a day   2. Continue to Treat inflammation:   A. Asmanex 200 HFA - 2 inhalations 2 time per day with spacer  B. OTC Rhinocort - one spray each nostril once a day  C.  Depo-Medrol 80 IM delivered in clinic today  3. If needed:   A. Proventil HFA 2 puffs every 4-6 hours  B. OTC antihistamine  C. OTC Mucinex DM  D. Nasal saline  4. DECREASE gabapentin - 200mg  AM + 300mg  PM  5. Return to clinic in 12 weeks or earlier if problem  I think that recently Karen Snyder has developed a viral respiratory tract infection with significant inflammation of her airway for which I will give her a systemic steroid.  In addition, she seems to be developing some side effects from the use of her plan to control cough and I think we are obligated to lower her dose of gabapentin and Dexilant during today's visit.  We  will just have to see how things go over the course of the next 12 weeks and if she does well I will see her back in his clinic at that point time but if she has significant problems in the face of this treatment she will contact me for further evaluation.  Laurette SchimkeEric Kozlow, MD Allergy / Immunology [facility]

## 2017-07-15 NOTE — Patient Instructions (Addendum)
  1. Continue to Treat reflux:   A. consolidate caffeine and chocolate consumption - aim for NONE   B. DECREASE Dexilant 30 mg capsule ONCE a day   2. Continue to Treat inflammation:   A. Asmanex 200 HFA - 2 inhalations 2 time per day with spacer  B. OTC Rhinocort - one spray each nostril once a day  C.  Depo-Medrol 80 IM delivered in clinic today  3. If needed:   A. Proventil HFA 2 puffs every 4-6 hours  B. OTC antihistamine  C. OTC Mucinex DM  D. Nasal saline  4. DECREASE gabapentin - 200mg  AM + 300mg  PM  5. Return to clinic in 12 weeks or earlier if problem

## 2017-07-16 ENCOUNTER — Encounter: Payer: Self-pay | Admitting: Allergy and Immunology

## 2017-10-05 ENCOUNTER — Ambulatory Visit: Payer: Medicare Other | Admitting: Allergy and Immunology

## 2017-10-19 ENCOUNTER — Ambulatory Visit: Payer: Medicare Other | Admitting: Allergy and Immunology

## 2017-10-19 ENCOUNTER — Encounter: Payer: Self-pay | Admitting: Allergy and Immunology

## 2017-10-19 VITALS — BP 142/86 | HR 84 | Resp 14

## 2017-10-19 DIAGNOSIS — J4530 Mild persistent asthma, uncomplicated: Secondary | ICD-10-CM | POA: Diagnosis not present

## 2017-10-19 DIAGNOSIS — K219 Gastro-esophageal reflux disease without esophagitis: Secondary | ICD-10-CM | POA: Diagnosis not present

## 2017-10-19 DIAGNOSIS — J3089 Other allergic rhinitis: Secondary | ICD-10-CM | POA: Diagnosis not present

## 2017-10-19 MED ORDER — MOMETASONE FURO-FORMOTEROL FUM 200-5 MCG/ACT IN AERO: INHALATION_SPRAY | RESPIRATORY_TRACT | 5 refills | Status: DC

## 2017-10-19 MED ORDER — GABAPENTIN 100 MG PO CAPS: ORAL_CAPSULE | ORAL | 0 refills | Status: DC

## 2017-10-19 NOTE — Patient Instructions (Addendum)
  1. Continue to Treat reflux:   A. consolidate caffeine and chocolate consumption    B. Dexilant 30 mg capsule twice a day   2. Continue to Treat inflammation:   A.  Dulera 200- 2 inhalations 2 time per day with spacer (ASMANEX)  B. OTC Rhinocort - one spray each nostril once a day  3. If needed:   A. Proventil HFA 2 puffs every 4-6 hours  B. OTC antihistamine  C. OTC Mucinex DM  D. Nasal saline  4. DECREASE gabapentin - 200mg  AM + 200mg  PM for 2 week then 100 mg a.m. +100 mg p.m. for 2 week then 100 mg in a.m. for 2 weeks then discontinue  5. Return to clinic in 12 weeks or earlier if problem

## 2017-10-19 NOTE — Progress Notes (Signed)
Follow-up Note  Referring Provider: Hadley Pen, MD Primary Provider: Hadley Pen, MD Date of Office Visit: 10/19/2017  Subjective:   Karen Snyder (DOB: 11-11-1940) is a 77 y.o. female who [facility] 10[facility]2017 in re-evaluation of the following:  HPI: [name] presents to this clinic in evaluation of her cough.  Her last visit to this clinic was 15 July 2017.  She would like to get off her gabapentin as she thinks that this is producing some problems with swelling of her extremities and making her very fatigued.  She is not really sure that it is helped her cough very much.  Regarding her cough, she actually thinks that she is doing okay with this condition.  She does have some continued cough during the daytime and the nighttime but it is not particularly as bad as it was in the past.  She is now able to tolerate her Dexilant at 30 mg twice a day without any GI problem.  In the past a higher dose would give rise to abdominal pain.  She is not having significant reflux.  She rarely eats chocolate or caffeine at this point in time.  Allergies as of 10/19/2017   No Known Allergies     Medication List      albuterol 108 (90 Base) MCG/ACT inhaler Commonly known as:  PROVENTIL HFA Inhale two puffs every four to six hours as needed for cough or wheeze.   amLODipine 5 MG tablet Commonly known as:  NORVASC Take 5 mg by mouth daily.   BIOTIN FORTE PO Take 1 tablet by mouth daily.   calcium-vitamin D 500-200 MG-UNIT tablet Commonly known as:  OSCAL WITH D Take 1 tablet by mouth daily.   Dexlansoprazole 30 MG capsule Commonly known as:  DEXILANT Take one capsule twice daily as directed.   dextromethorphan-guaiFENesin 30-600 MG 12hr tablet Commonly known as:  MUCINEX DM Take by mouth.   gabapentin 100 MG capsule Commonly known as:  NEURONTIN Take three tablets twice daily as directed   Mometasone Furoate 200 MCG/ACT  Aero Commonly known as:  ASMANEX HFA Inhale 2 puffs into the lungs daily. Take 2 puffs twice a day with spacer..   MULTI-VITAMIN DAILY PO Take 1 tablet by mouth daily.   PROBIOTIC ACIDOPHILUS PO Take 1 capsule by mouth daily.   RHINOCORT ALLERGY 32 MCG/ACT nasal spray Generic drug:  budesonide Place 1 spray into both nostrils daily.       Past Medical History:  Diagnosis Date  . Asthma   . GERD (gastroesophageal reflux disease)   . High blood pressure     Past Surgical History:  Procedure Laterality Date  . ADENOIDECTOMY  1946  . HERNIA REPAIR  2016  . TONSILLECTOMY  1946    Review of systems negative except as noted in HPI / PMHx or noted below:  Review of Systems  Constitutional: Negative.   HENT: Negative.   Eyes: Negative.   Respiratory: Negative.   Cardiovascular: Negative.   Gastrointestinal: Negative.   Genitourinary: Negative.   Musculoskeletal: Negative.   Skin: Negative.   Neurological: Negative.   Endo/Heme/Allergies: Negative.   Psychiatric/Behavioral: Negative.      Objective:   Vitals:   10/19/17 1558  BP: (!) 142/86  Pulse: 84  Resp: 14          Physical Exam  Constitutional: She is well-developed, well-nourished, and in no distress.  HENT:  Head: Normocephalic.  Right Ear:  Tympanic membrane, external ear and ear canal normal.  Left Ear: Tympanic membrane, external ear and ear canal normal.  Nose: Nose normal. No mucosal edema or rhinorrhea.  Mouth/Throat: Uvula is midline, oropharynx is clear and moist and mucous membranes are normal. No oropharyngeal exudate.  Eyes: Conjunctivae are normal.  Neck: Trachea normal. No tracheal tenderness present. No tracheal deviation present. No thyromegaly present.  Cardiovascular: Normal rate, regular rhythm, S1 normal, S2 normal and normal heart sounds.  No murmur heard. Pulmonary/Chest: Breath sounds normal. No stridor. No respiratory distress. She has no wheezes. She has no rales.   Musculoskeletal: She exhibits no edema.  Lymphadenopathy:       Head (right side): No tonsillar adenopathy present.       Head (left side): No tonsillar adenopathy present.    She has no cervical adenopathy.  Neurological: She is alert. Gait normal.  Skin: No rash noted. She is not diaphoretic. No erythema. Nails show no clubbing.  Psychiatric: Mood and affect normal.    Diagnostics:    Spirometry was performed and demonstrated an FEV1 of 1.45 at 79 % of predicted.  Assessment and Plan:   1. Asthma, well controlled, mild persistent   2. Other allergic rhinitis   3. LPRD (laryngopharyngeal reflux disease)     1. Continue to Treat reflux:   A. consolidate caffeine and chocolate consumption    B. Dexilant 30 mg capsule twice a day   2. Continue to Treat inflammation:   A.  Dulera 200- 2 inhalations 2 time per day with spacer (ASMANEX)  B. OTC Rhinocort - one spray each nostril once a day  3. If needed:   A. Proventil HFA 2 puffs every 4-6 hours  B. OTC antihistamine  C. OTC Mucinex DM  D. Nasal saline  4. DECREASE gabapentin - 200mg  AM + 200mg  PM for 2 week then 100 mg a.m. +100 mg p.m. for 2 week then 100 mg in a.m. for 2 weeks then discontinue  5. Return to clinic in 12 weeks or earlier if problem  We will take Karen Snyder off of her gabapentin and we will replace her Asmanex with a combination inhaler assuming that there is still a component of asthma contribute into her cough.  She will remain on therapy directed against reflux as noted above.  We will see how the next 12 weeks goes.  She will contact me during the interval should there be a significant problem.  Laurette SchimkeEric Kozlow, MD Allergy / Immunology [facility]

## 2017-10-20 ENCOUNTER — Encounter: Payer: Self-pay | Admitting: Allergy and Immunology

## 2018-01-14 ENCOUNTER — Encounter: Payer: Self-pay | Admitting: Allergy and Immunology

## 2018-01-14 ENCOUNTER — Ambulatory Visit: Payer: Medicare Other | Admitting: Allergy and Immunology

## 2018-01-14 VITALS — BP 138/78 | HR 92 | Resp 16

## 2018-01-14 DIAGNOSIS — J3089 Other allergic rhinitis: Secondary | ICD-10-CM

## 2018-01-14 DIAGNOSIS — K219 Gastro-esophageal reflux disease without esophagitis: Secondary | ICD-10-CM

## 2018-01-14 DIAGNOSIS — J4530 Mild persistent asthma, uncomplicated: Secondary | ICD-10-CM

## 2018-01-14 MED ORDER — MOMETASONE FUROATE 200 MCG/ACT IN AERO: 2.0000 | INHALATION_SPRAY | Freq: Two times a day (BID) | RESPIRATORY_TRACT | 5 refills | Status: DC

## 2018-01-14 NOTE — Progress Notes (Signed)
Follow-up Note  Referring Provider: Hadley Pen, MD Primary Provider: Hadley Pen, MD Date of Office Visit: 01/14/2018  Subjective:   Karen Snyder (DOB: [birth year]) is a 77 y.o. female who [facility] 01[facility]14/2018 in re-evaluation of the following:  HPI: Jaslen returns to this clinic in evaluation of her cough believed secondary to a component of asthma and reflux and a side effect she was experiencing from using gabapentin. I last saw her in this clinic on 19 October 2017.  At this point she appears to be doing relatively well.  She still has a cough but her cough is under relatively good control.  She is using some maneuvers such as swallowing and drinking and using cough drops that helped the issue with her throat and her cough.  She does not use a short acting bronchodilator but continues to use an inhaled steroid and she continues on a nasal steroid as well.  She was intolerant of using Dulera because of tremor and she is back on Asmanex.  Her reflux is under pretty good control at this point in time while using a total of 60 mg of Dexilant split twice a day.  She still continues to drink some caffeine and it does not sound as though she is going to complete a caffeine free diet.  She was able to taper off her gabapentin and the swelling in her lower extremities is gone and the fatigue is gone.  Allergies as of 01/14/2018   No Known Allergies     Medication List      albuterol 108 (90 Base) MCG/ACT inhaler Commonly known as:  PROVENTIL HFA Inhale two puffs every four to six hours as needed for cough or wheeze.   amLODipine 5 MG tablet Commonly known as:  NORVASC Take 5 mg by mouth daily.   BENEFIBER Chew Chew by mouth.   BIOTIN FORTE PO Take 1 tablet by mouth daily.   calcium-vitamin D 500-200 MG-UNIT tablet Commonly known as:  OSCAL WITH D Take 1 tablet by mouth daily.   Dexlansoprazole 30 MG capsule Commonly known as:   DEXILANT Take one capsule twice daily as directed.   dextromethorphan-guaiFENesin 30-600 MG 12hr tablet Commonly known as:  MUCINEX DM Take by mouth.   Mometasone Furoate 200 MCG/ACT Aero Commonly known as:  ASMANEX HFA Inhale 2 puffs into the lungs 2 (two) times daily. Use with spacer.  Rinse, gargle, and spit after use   MULTI-VITAMIN DAILY PO Take 1 tablet by mouth daily.   PROBIOTIC ACIDOPHILUS PO Take 1 capsule by mouth daily.   RHINOCORT ALLERGY 32 MCG/ACT nasal spray Generic drug:  budesonide Place 1 spray into both nostrils daily.       Past Medical History:  Diagnosis Date  . Asthma   . GERD (gastroesophageal reflux disease)   . High blood pressure     Past Surgical History:  Procedure Laterality Date  . ADENOIDECTOMY  1946  . HERNIA REPAIR  2016  . TONSILLECTOMY  1946    Review of systems negative except as noted in HPI / PMHx or noted below:  Review of Systems  Constitutional: Negative.   HENT: Negative.   Eyes: Negative.   Respiratory: Negative.   Cardiovascular: Negative.   Gastrointestinal: Negative.   Genitourinary: Negative.   Musculoskeletal: Negative.   Skin: Negative.   Neurological: Negative.   Endo/Heme/Allergies: Negative.   Psychiatric/Behavioral: Negative.      Objective:   Vitals:  01/14/18 1627  BP: 138/78  Pulse: 92  Resp: 16          Physical Exam  HENT:  Head: Normocephalic.  Right Ear: Tympanic membrane, external ear and ear canal normal.  Left Ear: Tympanic membrane, external ear and ear canal normal.  Nose: Nose normal. No mucosal edema or rhinorrhea.  Mouth/Throat: Uvula is midline, oropharynx is clear and moist and mucous membranes are normal. No oropharyngeal exudate.  Eyes: Conjunctivae are normal.  Neck: Trachea normal. No tracheal tenderness present. No tracheal deviation present. No thyromegaly present.  Cardiovascular: Normal rate, regular rhythm, S1 normal, S2 normal and normal heart sounds.  No  murmur heard. Pulmonary/Chest: Breath sounds normal. No stridor. No respiratory distress. She has no wheezes. She has no rales.  Musculoskeletal: She exhibits no edema.  Lymphadenopathy:       Head (right side): No tonsillar adenopathy present.       Head (left side): No tonsillar adenopathy present.    She has no cervical adenopathy.  Neurological: She is alert.  Skin: No rash noted. She is not diaphoretic. No erythema. Nails show no clubbing.    Diagnostics:    Spirometry was performed and demonstrated an FEV1 of 1.47 at 80 % of predicted.  Assessment and Plan:   1. Asthma, well controlled, mild persistent   2. Other allergic rhinitis   3. LPRD (laryngopharyngeal reflux disease)     1. Continue to Treat reflux:   A. consolidate caffeine and chocolate consumption    B. Dexilant 30 mg capsule twice a day   2. Continue to Treat inflammation:   A.  Asmanex 200 HFA - 2 inhalations 2 time per day with spacer    B. OTC Rhinocort - one spray each nostril once a day  3. If needed:   A. Proventil HFA 2 puffs every 4-6 hours  B. OTC antihistamine  C. OTC Mucinex DM  D. Nasal saline  4. Return to clinic in 6 months or earlier if problem  5. Obtain fall flu vaccine   Harriett Sineancy appears to be doing okay on her current plan which includes anti-inflammatory agents for airway and therapy directed against reflux.  She appears to understand quite well about the issue with her cough and she appears to have found behavioral modifications that she performs that helps her chronic cough.  At this point we will just see her back in this clinic in 6 months or earlier if there is a problem.  Laurette SchimkeEric Kozlow, MD Allergy / Immunology [facility]

## 2018-01-14 NOTE — Patient Instructions (Addendum)
  1. Continue to Treat reflux:   A. consolidate caffeine and chocolate consumption    B. Dexilant 30 mg capsule twice a day   2. Continue to Treat inflammation:   A.  Asmanex 200 HFA - 2 inhalations 2 time per day with spacer    B. OTC Rhinocort - one spray each nostril once a day  3. If needed:   A. Proventil HFA 2 puffs every 4-6 hours  B. OTC antihistamine  C. OTC Mucinex DM  D. Nasal saline  4. Return to clinic in 6 months or earlier if problem  5. Obtain fall flu vaccine

## 2018-01-18 ENCOUNTER — Encounter: Payer: Self-pay | Admitting: Allergy and Immunology

## 2018-01-22 ENCOUNTER — Other Ambulatory Visit: Payer: Self-pay | Admitting: Allergy and Immunology

## 2018-04-15 DIAGNOSIS — J3089 Other allergic rhinitis: Secondary | ICD-10-CM | POA: Insufficient documentation

## 2018-04-15 DIAGNOSIS — K219 Gastro-esophageal reflux disease without esophagitis: Secondary | ICD-10-CM | POA: Insufficient documentation

## 2018-04-15 DIAGNOSIS — I10 Essential (primary) hypertension: Secondary | ICD-10-CM

## 2018-04-15 DIAGNOSIS — J4530 Mild persistent asthma, uncomplicated: Secondary | ICD-10-CM

## 2018-04-15 HISTORY — DX: Mild persistent asthma, uncomplicated: J45.30

## 2018-04-15 HISTORY — DX: Gastro-esophageal reflux disease without esophagitis: K21.9

## 2018-04-15 HISTORY — DX: Other allergic rhinitis: J30.89

## 2018-04-15 HISTORY — DX: Essential (primary) hypertension: I10

## 2018-07-05 ENCOUNTER — Other Ambulatory Visit: Payer: Self-pay | Admitting: Allergy and Immunology

## 2018-07-19 ENCOUNTER — Ambulatory Visit: Payer: Medicare Other | Admitting: Allergy and Immunology

## 2018-07-19 ENCOUNTER — Encounter: Payer: Self-pay | Admitting: Allergy and Immunology

## 2018-07-19 ENCOUNTER — Other Ambulatory Visit: Payer: Self-pay | Admitting: Allergy and Immunology

## 2018-07-19 VITALS — BP 120/72 | HR 96 | Resp 18 | Ht 61.5 in | Wt 174.0 lb

## 2018-07-19 DIAGNOSIS — J4530 Mild persistent asthma, uncomplicated: Secondary | ICD-10-CM | POA: Diagnosis not present

## 2018-07-19 DIAGNOSIS — K219 Gastro-esophageal reflux disease without esophagitis: Secondary | ICD-10-CM | POA: Diagnosis not present

## 2018-07-19 DIAGNOSIS — J3089 Other allergic rhinitis: Secondary | ICD-10-CM

## 2018-07-19 MED ORDER — FAMOTIDINE 40 MG PO TABS: 40.0000 mg | ORAL_TABLET | Freq: Every day | ORAL | 5 refills | Status: DC

## 2018-07-19 NOTE — Patient Instructions (Signed)
  1. Continue to Treat reflux:   A.  consolidate caffeine and chocolate consumption    B.  Decrease Dexilant 30 mg 1 time a day in a.m.  C.  Start famotidine 40 mg 1 time a day in p.m.   2. Continue to Treat inflammation:   A.  Decrease Asmanex 200 HFA - 1 inhalations 1 time per day     B. OTC Rhinocort - one spray each nostril once a day  3. If needed:   A. Proventil HFA 2 puffs every 4-6 hours  B. OTC antihistamine  C. OTC Mucinex DM  D. Nasal saline  4. Return to clinic in 6 months or earlier if problem

## 2018-07-19 NOTE — Progress Notes (Signed)
Follow-up Note  Referring Provider: Hadley Penobbins, Robert A, MD Primary Provider: Hadley Penobbins, Robert A, MD Date of Office Visit: 07/19/2018  Subjective:   Karen Snyder (DOB: [birth year]) is a 77 y.o. female who [facility] in re-evaluation of the following:  HPI: Karen Snyder returns to this clinic in reevaluation of her multifactorial cough with components of asthma and LPR and allergic rhinitis.  Her last visit to this clinic was 14 January 2018.  She still continues to have chronic cough although this is much better on her current plan of anti-inflammatory agents for airway inflammation and aggressive therapy directed against reflux.  She has not had any wheezing or shortness of breath and does not use any short acting bronchodilator.  She does develop a sensation in the back of her throat with the use of her Asmanex.  Her nose has been doing quite well without any significant congestion but occasionally she still has some intermittent sneezing spells.  She can smell and taste without any problem.  Her reflux is doing well but if she lays down after eating a meal she will wake up choking.  This occurs even though she has a sleep numbers bed and uses a reclining position.  This occurs even though she continues to use Dexilant twice a day.  Allergies as of 07/19/2018   No Known Allergies     Medication List      albuterol 108 (90 Base) MCG/ACT inhaler Commonly known as:  PROVENTIL HFA Inhale two puffs every four to six hours as needed for cough or wheeze.   amLODipine 5 MG tablet Commonly known as:  NORVASC Take 5 mg by mouth daily.   BENEFIBER Chew Chew by mouth.   BIOTIN FORTE PO Take 1 tablet by mouth daily.   calcium-vitamin D 500-200 MG-UNIT tablet Commonly known as:  OSCAL WITH D Take 1 tablet by mouth daily.   Dexlansoprazole 30 MG capsule Commonly known as:  DEXILANT TAKE ONE CAPSULE TWICE DAILY AS DIRECTED.     dextromethorphan-guaiFENesin 30-600 MG 12hr tablet Commonly known as:  MUCINEX DM Take by mouth.   famotidine 40 MG tablet Commonly known as:  PEPCID Take 1 tablet (40 mg total) by mouth at bedtime.   Mometasone Furoate 200 MCG/ACT Aero Commonly known as:  ASMANEX HFA Inhale 2 puffs into the lungs 2 (two) times daily. Use with spacer.  Rinse, gargle, and spit after use   MULTI-VITAMIN DAILY PO Take 1 tablet by mouth daily.   RHINOCORT ALLERGY 32 MCG/ACT nasal spray Generic drug:  budesonide Place 1 spray into both nostrils daily.       Past Medical History:  Diagnosis Date  . Asthma   . GERD (gastroesophageal reflux disease)   . High blood pressure     Past Surgical History:  Procedure Laterality Date  . ADENOIDECTOMY  1946  . HERNIA REPAIR  2016  . TONSILLECTOMY  1946    Review of systems negative except as noted in HPI / PMHx or noted below:  Review of Systems  Constitutional: Negative.   HENT: Negative.   Eyes: Negative.   Respiratory: Negative.   Cardiovascular: Negative.   Gastrointestinal: Negative.   Genitourinary: Negative.   Musculoskeletal: Negative.   Skin: Negative.   Neurological: Negative.   Endo/Heme/Allergies: Negative.   Psychiatric/Behavioral: Negative.      Objective:   Vitals:   07/19/18 1132  BP: 120/72  Pulse: 96  Resp: 18  SpO2: 97%  Height: 5' 1.5" (156.2 cm)  Weight: 174 lb (78.9 kg)   Physical Exam Constitutional:      Appearance: She is not diaphoretic.     Comments: Slight short duration cough intermittently with throat clearing  HENT:     Head: Normocephalic.     Right Ear: Tympanic membrane, ear canal and external ear normal.     Left Ear: Tympanic membrane, ear canal and external ear normal.     Nose: Nose normal. No mucosal edema or rhinorrhea.     Mouth/Throat:     Pharynx: Uvula midline. No oropharyngeal exudate.  Eyes:     Conjunctiva/sclera: Conjunctivae normal.  Neck:     Thyroid: No thyromegaly.      Trachea: Trachea normal. No tracheal tenderness or tracheal deviation.  Cardiovascular:     Rate and Rhythm: Normal rate and regular rhythm.     Heart sounds: Normal heart sounds, S1 normal and S2 normal. No murmur.  Pulmonary:     Effort: No respiratory distress.     Breath sounds: Normal breath sounds. No stridor. No wheezing or rales.  Lymphadenopathy:     Head:     Right side of head: No tonsillar adenopathy.     Left side of head: No tonsillar adenopathy.     Cervical: No cervical adenopathy.  Skin:    Findings: No erythema or rash.     Nails: There is no clubbing.   Neurological:     Mental Status: She is alert.     Diagnostics:    Spirometry was performed and demonstrated an FEV1 of 1.36 at 75 % of predicted.  Assessment and Plan:   1. Asthma, well controlled, mild persistent   2. Other allergic rhinitis   3. LPRD (laryngopharyngeal reflux disease)     1. Continue to Treat reflux:   A.  consolidate caffeine and chocolate consumption    B.  Decrease Dexilant 30 mg 1 time a day in a.m.  C.  Start famotidine 40 mg 1 time a day in p.m.   2. Continue to Treat inflammation:   A.  Decrease Asmanex 200 HFA - 1 inhalations 1 time per day     B. OTC Rhinocort - one spray each nostril once a day  3. If needed:   A. Proventil HFA 2 puffs every 4-6 hours  B. OTC antihistamine  C. OTC Mucinex DM  D. Nasal saline  4. Return to clinic in 6 months or earlier if problem  Karen Snyder appears to be doing okay regarding her cough of multifactorial etiology.  We are going to alter her therapy for reflux as it obviously she still continues to have episodes of regurgitation on occasion and were going to alter her therapy for inflammation as she has been doing so well regarding control of her asthma.  Assuming she does well I will see her back in this clinic in 6 months or earlier if there is a problem.  Laurette SchimkeEric Demarqus Jocson, MD Allergy / Immunology [facility]

## 2018-07-20 ENCOUNTER — Encounter: Payer: Self-pay | Admitting: Allergy and Immunology

## 2018-11-30 ENCOUNTER — Other Ambulatory Visit: Payer: Self-pay | Admitting: Allergy and Immunology

## 2019-01-19 ENCOUNTER — Other Ambulatory Visit: Payer: Self-pay

## 2019-01-19 ENCOUNTER — Encounter: Payer: Self-pay | Admitting: Allergy and Immunology

## 2019-01-19 ENCOUNTER — Ambulatory Visit (INDEPENDENT_AMBULATORY_CARE_PROVIDER_SITE_OTHER): Payer: Medicare Other | Admitting: Allergy and Immunology

## 2019-01-19 VITALS — BP 104/68 | HR 120 | Temp 98.3°F | Resp 18

## 2019-01-19 DIAGNOSIS — K219 Gastro-esophageal reflux disease without esophagitis: Secondary | ICD-10-CM | POA: Diagnosis not present

## 2019-01-19 DIAGNOSIS — J3089 Other allergic rhinitis: Secondary | ICD-10-CM | POA: Diagnosis not present

## 2019-01-19 DIAGNOSIS — J4530 Mild persistent asthma, uncomplicated: Secondary | ICD-10-CM

## 2019-01-19 NOTE — Progress Notes (Signed)
[place]   Follow-up Note  Referring Provider: Hadley Snyder, Karen A, MD Primary Provider: Hadley Snyder, Karen A, MD Date of Office Visit: 01/19/2019  Subjective:   Karen Snyder (DOB: Nov 18, 1940) is Snyder 78 y.o. female who [facility] 01/[facility]/2019 in re-evaluation of the following:  HPI: Karen Snyder returns to this clinic in evaluation of multifactorial cough with contribution from asthma and allergic rhinitis and LPR.  Her last visit to this clinic was 19 July 2018 at which point in time she was actually doing quite well we made an attempt to decrease her therapy for both reflux and inflammation.  Apparently February 2020 she developed an episode of coughing and Snyder rattle in her chest and she saw Dr. Sherral Snyder who gave her Snyder systemic steroid and asked her to increase her inhaled steroid and also increase her proton pump inhibitor dose.  Since that point in time she has done okay although she does have Snyder chronic cough.  She does not need to use Snyder short acting bronchodilator.  Her nose has not really been bothering her.  As long as she continues to eat the right food she has no problems with reflux such as choking.  However, she has been eating lots of chocolate the spring and in fact she is eating chocolate on Snyder daily basis presently and has been having some problems with her throat while doing so.  Allergies as of 01/19/2019   No Known Allergies     Medication List      albuterol 108 (90 Base) MCG/ACT inhaler Commonly known as: Proventil HFA Inhale two puffs every four to six hours as needed for cough or wheeze.   amLODipine 5 MG tablet Commonly known as: NORVASC Take 5 mg by mouth daily.   Asmanex HFA 200 MCG/ACT Aero Generic drug: Mometasone Furoate INHALE 2 PUFFS INTO THE LUNGS 2 TIMES DAILY. USE WITH SPACER.  RINSE, GARGLE, AND SPIT AFTER USE   Benefiber Chew Chew by mouth.   BIOTIN FORTE PO Take 1 tablet by  mouth daily.   calcium-vitamin D 500-200 MG-UNIT tablet Commonly known as: OSCAL WITH D Take 1 tablet by mouth daily.   Dexlansoprazole 30 MG capsule Commonly known as: Dexilant Take 1 capsule (30 mg total) by mouth every morning.   Dexilant 60 MG capsule Generic drug: dexlansoprazole Take 60 mg by mouth daily.   dextromethorphan-guaiFENesin 30-600 MG 12hr tablet Commonly known as: MUCINEX DM Take by mouth.   famotidine 40 MG tablet Commonly known as: PEPCID Take 1 tablet (40 mg total) by mouth at bedtime.   MULTI-VITAMIN DAILY PO Take 1 tablet by mouth daily.   Rhinocort Allergy 32 MCG/ACT nasal spray Generic drug: budesonide Place 1 spray into both nostrils daily.       Past Medical History:  Diagnosis Date  . Asthma   . GERD (gastroesophageal reflux disease)   . High blood pressure     Past Surgical History:  Procedure Laterality Date  . ADENOIDECTOMY  1946  . HERNIA REPAIR  2016  . TONSILLECTOMY  1946    Review of systems negative except as noted in HPI / PMHx or noted below:  Review of Systems  Constitutional: Negative.   HENT: Negative.   Eyes: Negative.   Respiratory: Negative.   Cardiovascular: Negative.   Gastrointestinal: Negative.   Genitourinary: Negative.   Musculoskeletal: Negative.   Skin: Negative.   Neurological: Negative.   Endo/Heme/Allergies: Negative.  Psychiatric/Behavioral: Negative.      Objective:   Vitals:   01/19/19 1146  BP: 104/68  Pulse: (!) 120  Resp: 18  Temp: 98.3 F (36.8 C)  SpO2: 95%          Physical Exam Constitutional:      Appearance: She is not diaphoretic.  HENT:     Head: Normocephalic.     Right Ear: Tympanic membrane, ear canal and external ear normal.     Left Ear: Tympanic membrane, ear canal and external ear normal.     Nose: Nose normal. No mucosal edema or rhinorrhea.     Mouth/Throat:     Pharynx: Uvula midline. No oropharyngeal exudate.  Eyes:     Conjunctiva/sclera:  Conjunctivae normal.  Neck:     Thyroid: No thyromegaly.     Trachea: Trachea normal. No tracheal tenderness or tracheal deviation.  Cardiovascular:     Rate and Rhythm: Normal rate and regular rhythm.     Heart sounds: Normal heart sounds, S1 normal and S2 normal. No murmur.  Pulmonary:     Effort: No respiratory distress.     Breath sounds: Normal breath sounds. No stridor. No wheezing or rales.  Lymphadenopathy:     Head:     Right side of head: No tonsillar adenopathy.     Left side of head: No tonsillar adenopathy.     Cervical: No cervical adenopathy.  Skin:    Findings: No erythema or rash.     Nails: There is no clubbing.   Neurological:     Mental Status: She is alert.     Diagnostics:    Spirometry was performed and demonstrated an FEV1 of 1.35 at 75 % of predicted.  Assessment and Plan:   1. Asthma, well controlled, mild persistent   2. Other allergic rhinitis   3. LPRD (laryngopharyngeal reflux disease)     1. Continue to Treat reflux:   Snyder.  consolidate caffeine and chocolate consumption    B.  Dexilant 60 mg 1 time Snyder day in Snyder.m.  C.  famotidine 40 mg 1 time Snyder day in p.m.   2. Continue to Treat inflammation:   Snyder.  Asmanex 200 HFA - 2 inhalations 2 time per day     B. OTC Rhinocort - one spray each nostril once Snyder day  3. If needed:   Snyder. Proventil HFA 2 puffs every 4-6 hours  B. OTC antihistamine  C. OTC Mucinex DM  D. Nasal saline  4. "action plan" for asthma flare up:   Snyder. Increase Asmanex to 3 inhalations 3 times per day  B. Use Proventil HFA if needed  5. Return to clinic in 6 months or earlier if problem  6. Obtain fall flu vaccine  Karen Snyder appears to be doing okay.  Okay for Karen Snyder means she has Snyder chronic cough.  She has respiratory insults including reflux induced respiratory disease and inflammation from her asthma.  We will have her utilize Snyder plan of action as noted above to address these issues.  I think she would do better if she stopped  her caffeine and chocolate consumption and consistently use Snyder combination of Dexilant and famotidine.  She will continue on Asmanex at Snyder higher dose than she was using previously.  I will see her back in this clinic in 6 months or earlier if there is Snyder problem.  Allena Katz, MD Allergy / Immunology [place]

## 2019-01-19 NOTE — Patient Instructions (Addendum)
  1. Continue to Treat reflux:   A.  consolidate caffeine and chocolate consumption    B.  Dexilant 60 mg 1 time a day in a.m.  C.  famotidine 40 mg 1 time a day in p.m.   2. Continue to Treat inflammation:   A.  Asmanex 200 HFA - 2 inhalations 2 time per day     B. OTC Rhinocort - one spray each nostril once a day  3. If needed:   A. Proventil HFA 2 puffs every 4-6 hours  B. OTC antihistamine  C. OTC Mucinex DM  D. Nasal saline  4. "action plan" for asthma flare up:   A. Increase Asmanex to 3 inhalations 3 times per day  B. Use Proventil HFA if needed  5. Return to clinic in 6 months or earlier if problem  6. Obtain fall flu vaccine

## 2019-01-20 ENCOUNTER — Encounter: Payer: Self-pay | Admitting: Allergy and Immunology

## 2019-06-20 DIAGNOSIS — R0609 Other forms of dyspnea: Secondary | ICD-10-CM

## 2019-06-20 DIAGNOSIS — J40 Bronchitis, not specified as acute or chronic: Secondary | ICD-10-CM | POA: Insufficient documentation

## 2019-06-20 DIAGNOSIS — R0600 Dyspnea, unspecified: Secondary | ICD-10-CM | POA: Insufficient documentation

## 2019-06-20 DIAGNOSIS — R609 Edema, unspecified: Secondary | ICD-10-CM

## 2019-06-20 HISTORY — DX: Bronchitis, not specified as acute or chronic: J40

## 2019-06-20 HISTORY — DX: Edema, unspecified: R60.9

## 2019-06-20 HISTORY — DX: Other forms of dyspnea: R06.09

## 2019-06-20 HISTORY — DX: Dyspnea, unspecified: R06.00

## 2019-07-19 DIAGNOSIS — H04123 Dry eye syndrome of bilateral lacrimal glands: Secondary | ICD-10-CM

## 2019-07-19 DIAGNOSIS — H35363 Drusen (degenerative) of macula, bilateral: Secondary | ICD-10-CM | POA: Insufficient documentation

## 2019-07-19 DIAGNOSIS — H02886 Meibomian gland dysfunction of left eye, unspecified eyelid: Secondary | ICD-10-CM | POA: Insufficient documentation

## 2019-07-19 DIAGNOSIS — H0100A Unspecified blepharitis right eye, upper and lower eyelids: Secondary | ICD-10-CM

## 2019-07-19 DIAGNOSIS — H02883 Meibomian gland dysfunction of right eye, unspecified eyelid: Secondary | ICD-10-CM

## 2019-07-19 HISTORY — DX: Dry eye syndrome of bilateral lacrimal glands: H04.123

## 2019-07-19 HISTORY — DX: Drusen (degenerative) of macula, bilateral: H35.363

## 2019-07-19 HISTORY — DX: Meibomian gland dysfunction of right eye, unspecified eyelid: H02.883

## 2019-07-19 HISTORY — DX: Unspecified blepharitis right eye, upper and lower eyelids: H01.00A

## 2019-07-25 ENCOUNTER — Other Ambulatory Visit: Payer: Self-pay

## 2019-07-25 ENCOUNTER — Ambulatory Visit: Payer: Medicare PPO | Admitting: Allergy and Immunology

## 2019-07-25 ENCOUNTER — Encounter: Payer: Self-pay | Admitting: Allergy and Immunology

## 2019-07-25 VITALS — BP 150/88 | HR 94 | Temp 98.2°F | Resp 18

## 2019-07-25 DIAGNOSIS — J4530 Mild persistent asthma, uncomplicated: Secondary | ICD-10-CM

## 2019-07-25 DIAGNOSIS — K219 Gastro-esophageal reflux disease without esophagitis: Secondary | ICD-10-CM

## 2019-07-25 DIAGNOSIS — J3089 Other allergic rhinitis: Secondary | ICD-10-CM | POA: Diagnosis not present

## 2019-07-25 NOTE — Progress Notes (Signed)
[place]   Follow-up Note  Referring Provider: Myrlene Broker, MD  Primary Provider: Myrlene Broker, MD Date of Office Visit: 07/25/2019  Subjective:   Karen Snyder (DOB: 27-Nov-1940) is a 79 y.o. female who [facility] to [facility] on 07/25/2019 in re-evaluation of the following:  HPI: Karen Snyder returns to this clinic in reevaluation of a multifactorial form of cough with contribution from asthma and allergic rhinitis and LPR.  Her last visit to this clinic was 19 January 2019.  She recently visited with her primary care doctor on 20 June 2019 in evaluation of dyspnea on exertion and received a systemic steroid and antibiotics for apparent "bronchitis".  A chest x-ray apparently identified bilateral pleural effusions and she was having some issues with swelling of her lower extremities.  Then on 07 July 2019 she was given lisinopril and she coughed like crazy until this medication was discontinued on 18 July 2019 and substituted with a ARB.  Over the course of the past several days her cough has diminished dramatically.  She continues to use Asmanex on a daily basis and rarely uses a short acting bronchodilator and at this point in time she can actually exert herself without much problem and she does continue to have a chronic cough but certainly much better than it was during December.  She has had some sneezing and she has had some watery eyes on occasion usually in conjunction with coughing.  She continues on Rhinocort.  She believes her reflux is under very good control at this point in time while using a proton pump inhibitor.  She stopped her H2 receptor left blocker because it gave rise to stomach upset.  She will have a caffeinated drink when she goes out to eat and she will occasionally have some chocolate.  She did receive the flu vaccine this year.  Allergies as of 07/25/2019   No Known Allergies       Medication List    albuterol 108 (90 Base) MCG/ACT inhaler Commonly known as: Proventil HFA Inhale two puffs every four to six hours as needed for cough or wheeze.   amLODipine 10 MG tablet Commonly known as: NORVASC 1 tablet daily   Asmanex HFA 200 MCG/ACT Aero Generic drug: Mometasone Furoate INHALE 2 PUFFS INTO THE LUNGS 2 TIMES DAILY. USE WITH SPACER.  RINSE, GARGLE, AND SPIT AFTER USE   Benefiber Chew Chew by mouth.   BIOTIN FORTE PO Take 1 tablet by mouth daily.   calcium-vitamin D 500-200 MG-UNIT tablet Commonly known as: OSCAL WITH D Take 1 tablet by mouth daily.   Dexilant 60 MG capsule Generic drug: dexlansoprazole Take 60 mg by mouth daily.   dextromethorphan-guaiFENesin 30-600 MG 12hr tablet Commonly known as: [place] DM Take by mouth.   MULTI-VITAMIN DAILY PO Take 1 tablet by mouth daily.   Rhinocort Allergy 32 MCG/ACT nasal spray Generic drug: budesonide Place 1 spray into both nostrils daily.   telmisartan 40 MG tablet Commonly known as: MICARDIS Take by mouth.       Past Medical History:  Diagnosis Date  . Asthma   . GERD (gastroesophageal reflux disease)   . High blood pressure     Past Surgical History:  Procedure Laterality Date  . ADENOIDECTOMY  1946  . HERNIA REPAIR  2016  . TONSILLECTOMY  1946    Review of systems negative except as noted in HPI / PMHx or noted below:  Review of Systems  Constitutional: Negative.   HENT: Negative.   Eyes: Negative.   Respiratory: Negative.   Cardiovascular: Negative.   Gastrointestinal: Negative.   Genitourinary: Negative.   Musculoskeletal: Negative.   Skin: Negative.   Neurological: Negative.   Endo/Heme/Allergies: Negative.   Psychiatric/Behavioral: Negative.      Objective:   Vitals:   07/25/19 1045  BP: (!) 150/88  Pulse: 94  Resp: 18  Temp: 98.2 F (36.8 C)  SpO2: 94%          Physical Exam Constitutional:      Appearance: She is not diaphoretic.  HENT:      Head: Normocephalic.     Right Ear: Tympanic membrane, ear canal and external ear normal.     Left Ear: Tympanic membrane, ear canal and external ear normal.     Nose: Nose normal. No mucosal edema or rhinorrhea.     Mouth/Throat:     Pharynx: Uvula midline. No oropharyngeal exudate.  Eyes:     Conjunctiva/sclera: Conjunctivae normal.  Neck:     Thyroid: No thyromegaly.     Trachea: Trachea normal. No tracheal tenderness or tracheal deviation.  Cardiovascular:     Rate and Rhythm: Normal rate and regular rhythm.     Heart sounds: Normal heart sounds, S1 normal and S2 normal. No murmur.  Pulmonary:     Effort: No respiratory distress.     Breath sounds: Normal breath sounds. No stridor. No wheezing or rales.  Lymphadenopathy:     Head:     Right side of head: No tonsillar adenopathy.     Left side of head: No tonsillar adenopathy.     Cervical: No cervical adenopathy.  Skin:    Findings: No erythema or rash.     Nails: There is no clubbing.  Neurological:     Mental Status: She is alert.     Diagnostics:    Spirometry was performed and demonstrated an FEV1 of 1.50 at 85 % of predicted.  Results of the chest x-ray obtained 20 June 2019 identified the following:  Small bilateral effusions without heart failure. Mild bibasilar airspace disease likely atelectasis however infection possible.  Results of an EKG obtained 20 June 2019 identified the following:  Ventricular Rate          73    BPM           Atrial Rate            73    BPM           P-R Interval            126    ms           QRS Duration            80    ms           Q-T Interval            404    ms           QTC                445    ms           P Axis               64    degrees         R Axis               95  degrees         T Axis               61    degrees           Sinus rhythm   Rightward axis   Nonspecific ST-T changes  Results of a blood test obtained 15 June 2018 identified WBC 5.5, hemoglobin 14.0, platelet 257.  Assessment and Plan:   1. Asthma, well controlled, mild persistent   2. Other allergic rhinitis   3. LPRD (laryngopharyngeal reflux disease)     1. Continue to Treat reflux:   A.  Minimize caffeine and chocolate consumption    B.  Dexilant 60 mg 1 time a day in a.m.   2. Continue to Treat inflammation:   A.  Asmanex 200 HFA - 2 inhalations 2 time per day (EMPTY LUNGS)  B. OTC Rhinocort - one spray each nostril once a day  3. If needed:   A. Proventil HFA 2 puffs every 4-6 hours  B. OTC antihistamine  C. OTC Mucinex DM  D. Nasal saline  E. OTC Pataday-1 drop each eye 1 time per day  4. "action plan" for asthma flare up:   A. Increase Asmanex to 3 inhalations 3 times per day  B. Use Proventil HFA if needed  5. Return to clinic in 6 months or earlier if problem  6. Obtain Covid vaccine  Karen Snyder appears to be doing relatively well although November and December were very active months for her respiratory tract.  It is difficult to say exactly why she had a problem at the beginning in November but certainly her December event was precipitated by the use of a ACE inhibitor which fortunately was discontinued.  For now she will remain on therapy directed against respiratory tract inflammation and reflux as noted above and we will see how things go over the course of the next 6 months.  We did reinforce her inhalation technique today for the use of Asmanex.  Karen Schimke, MD Allergy / Immunology [facility]

## 2019-07-25 NOTE — Patient Instructions (Addendum)
  1. Continue to Treat reflux:   A.  Minimize caffeine and chocolate consumption    B.  Dexilant 60 mg 1 time a day in a.m.   2. Continue to Treat inflammation:   A.  Asmanex 200 HFA - 2 inhalations 2 time per day (EMPTY LUNGS)  B. OTC Rhinocort - one spray each nostril once a day  3. If needed:   A. Proventil HFA 2 puffs every 4-6 hours  B. OTC antihistamine  C. OTC Mucinex DM  D. Nasal saline  E. OTC Pataday-1 drop each eye 1 time per day  4. "action plan" for asthma flare up:   A. Increase Asmanex to 3 inhalations 3 times per day  B. Use Proventil HFA if needed  5. Return to clinic in 6 months or earlier if problem  6. Obtain Covid vaccine

## 2019-07-26 ENCOUNTER — Encounter: Payer: Self-pay | Admitting: Allergy and Immunology

## 2019-08-15 DIAGNOSIS — Z1211 Encounter for screening for malignant neoplasm of colon: Secondary | ICD-10-CM | POA: Insufficient documentation

## 2019-08-15 DIAGNOSIS — M26621 Arthralgia of right temporomandibular joint: Secondary | ICD-10-CM

## 2019-08-15 HISTORY — DX: Encounter for screening for malignant neoplasm of colon: Z12.11

## 2019-08-15 HISTORY — DX: Arthralgia of right temporomandibular joint: M26.621

## 2019-09-16 ENCOUNTER — Other Ambulatory Visit: Payer: Self-pay | Admitting: Allergy and Immunology

## 2020-01-11 DIAGNOSIS — J329 Chronic sinusitis, unspecified: Secondary | ICD-10-CM | POA: Insufficient documentation

## 2020-01-11 DIAGNOSIS — J310 Chronic rhinitis: Secondary | ICD-10-CM

## 2020-01-11 HISTORY — DX: Chronic rhinitis: J31.0

## 2020-01-11 HISTORY — DX: Chronic sinusitis, unspecified: J32.9

## 2020-01-17 ENCOUNTER — Other Ambulatory Visit: Payer: Self-pay | Admitting: Allergy and Immunology

## 2020-01-26 ENCOUNTER — Encounter: Payer: Self-pay | Admitting: Allergy and Immunology

## 2020-01-26 ENCOUNTER — Other Ambulatory Visit: Payer: Self-pay

## 2020-01-26 ENCOUNTER — Ambulatory Visit: Payer: Medicare PPO | Admitting: Allergy and Immunology

## 2020-01-26 VITALS — BP 124/68 | HR 88 | Resp 24 | Ht 61.6 in | Wt 166.4 lb

## 2020-01-26 DIAGNOSIS — J4540 Moderate persistent asthma, uncomplicated: Secondary | ICD-10-CM | POA: Diagnosis not present

## 2020-01-26 DIAGNOSIS — K219 Gastro-esophageal reflux disease without esophagitis: Secondary | ICD-10-CM

## 2020-01-26 DIAGNOSIS — J3089 Other allergic rhinitis: Secondary | ICD-10-CM | POA: Diagnosis not present

## 2020-01-26 MED ORDER — BREZTRI AEROSPHERE 160-9-4.8 MCG/ACT IN AERO: INHALATION_SPRAY | RESPIRATORY_TRACT | 5 refills | Status: DC

## 2020-01-26 NOTE — Patient Instructions (Addendum)
  1. Continue to Treat reflux:   A.  Minimize caffeine and chocolate consumption    B.  Dexilant 60 mg 1 time a day in a.m.   2. Continue to Treat inflammation:   A.  BREZTRI - 2 inhalations 2 time per day (Asmanex)  B. OTC Rhinocort - one spray each nostril once a day  3. If needed:   A. Proventil HFA 2 puffs every 4-6 hours  B. OTC antihistamine  C. OTC Mucinex DM  D. Nasal saline  E. OTC Pataday-1 drop each eye 1 time per day  4. "action plan" for asthma flare up:   A. Add Asmanex 200 - 3 inhalations 3 times per day to [place]  B. Use Proventil HFA if needed  5. Return to clinic in 6 months or earlier if problem  6. Call clinic in 4 weeks about response to [facility]  7. Obtain fall flu vaccine

## 2020-01-26 NOTE — Progress Notes (Signed)
[place] - [place] - [place] - [place] - [name]   Follow-up Note  Referring Provider: Hadley Pen, MD Primary Provider: Hadley Pen, MD Date of Office Visit: 01/26/2020  Subjective:   Karen Snyder (DOB: August 29, 1940) is a 79 y.o. 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  Her last visit to this clinic was for January 2021.  Overall she has been stable.  Stability for Karen Snyder means that she has a chronic cough.  Fortunately, this cough does not appear to interfere with her sleep and other functions of daily activity.  She does not develop any posttussive emesis or micturition.  She continues to use therapy directed against reflux with her proton pump inhibitor and continues with an inhaled steroid and nasal steroid.  It does not sound as though she required a systemic steroid or an antibiotic for any type of airway issue up until June 2021.  Rarely does she use a short acting bronchodilator  In June 2021 her husband contracted a viral respiratory tract infection with Karen Snyder ended up contracting a similar viral infection manifested as nasal congestion and sneezing and clear rhinorrhea and unrelenting coughing for which she saw her primary care doctor who treated with her systemic steroid and doxycycline.  Her nose is a lot better at this point in time but she still continues to cough a lot.  She has not had any fever or anosmia or ugly nasal discharge or chest pain or sputum production.  She has received 2 Pfizer Covid vaccinations.  Allergies as of 01/26/2020   No Known Allergies     Medication List      albuterol 108 (90 Base) MCG/ACT inhaler Commonly known as: Proventil HFA Inhale two puffs every four to six hours as needed for cough or wheeze.     amLODipine 10 MG tablet Commonly known as: NORVASC 1 tablet daily   Asmanex HFA 200 MCG/ACT Aero Generic drug: Mometasone Furoate INHALE 2 PUFFS TWICE DAILY. USE WITH SPACER. RINSE, GARGLE, AND SPIT AFTER USE   Benefiber Chew Chew by mouth.   BIOTIN FORTE PO Take 1 tablet by mouth daily.   calcium-vitamin D 500-200 MG-UNIT tablet Commonly known as: OSCAL WITH D Take 1 tablet by mouth daily.   Dexilant 60 MG capsule Generic drug: dexlansoprazole Take 60 mg by mouth daily.   dextromethorphan-guaiFENesin 30-600 MG 12hr tablet Commonly known as: MUCINEX DM Take by mouth.   MULTI-VITAMIN DAILY PO Take 1 tablet by mouth daily.   Rhinocort Allergy 32 MCG/ACT nasal spray Generic drug: budesonide Place 1 spray into both nostrils daily.   telmisartan 80 MG tablet Commonly known as: MICARDIS      Past Medical History:  Diagnosis Date  . Asthma   . GERD (gastroesophageal reflux disease)   . High blood pressure     Past Surgical History:  Procedure Laterality Date  . ADENOIDECTOMY  1946  . HERNIA REPAIR  2016  . TONSILLECTOMY  1946    Review of systems negative except as noted in HPI / PMHx or noted below:  Review of Systems  Constitutional: Negative.   HENT: Negative.   Eyes: Negative.   Respiratory: Negative.   Cardiovascular: Negative.   Gastrointestinal: Negative.   Genitourinary: Negative.   Musculoskeletal: Negative.   Skin: Negative.  Neurological: Negative.   Endo/Heme/Allergies: Negative.   Psychiatric/Behavioral: Negative.      Objective:   Vitals:   01/26/20 1109  BP: 124/68  Pulse: 88  Resp: (!) 24  SpO2: 96%   Height: 5' 1.6" (156.5 cm)  Weight: 166 lb 6.4 oz (75.5 kg)   Physical Exam Constitutional:      Appearance: She is not diaphoretic.     Comments: Coughing  HENT:     Head: Normocephalic.     Right Ear: Tympanic membrane, ear canal and external ear normal.     Left Ear: Tympanic membrane, ear canal and external ear  normal.     Nose: Nose normal. No mucosal edema or rhinorrhea.     Mouth/Throat:     Pharynx: Uvula midline. No oropharyngeal exudate.  Eyes:     Conjunctiva/sclera: Conjunctivae normal.  Neck:     Thyroid: No thyromegaly.     Trachea: Trachea normal. No tracheal tenderness or tracheal deviation.  Cardiovascular:     Rate and Rhythm: Normal rate and regular rhythm.     Heart sounds: Normal heart sounds, S1 normal and S2 normal. No murmur heard.   Pulmonary:     Effort: No respiratory distress.     Breath sounds: Normal breath sounds. No stridor. No wheezing or rales.  Lymphadenopathy:     Head:     Right side of head: No tonsillar adenopathy.     Left side of head: No tonsillar adenopathy.     Cervical: No cervical adenopathy.  Skin:    Findings: No erythema or rash.     Nails: There is no clubbing.  Neurological:     Mental Status: She is alert.     Diagnostics:    Spirometry was performed and demonstrated an FEV1 of 1.40 at 79 % of predicted.  Assessment and Plan:   1. Not well controlled moderate persistent asthma   2. Other allergic rhinitis   3. LPRD (laryngopharyngeal reflux disease)     1. Continue to Treat reflux:   A.  Minimize caffeine and chocolate consumption    B.  Dexilant 60 mg 1 time a day in a.m.   2. Continue to Treat inflammation:   A.  BREZTRI - 2 inhalations 2 time per day (Asmanex)  B. OTC Rhinocort - one spray each nostril once a day  3. If needed:   A. Proventil HFA 2 puffs every 4-6 hours  B. OTC antihistamine  C. OTC Mucinex DM  D. Nasal saline  E. OTC Pataday-1 drop each eye 1 time per day  4. "action plan" for asthma flare up:   A. Add Asmanex 200 - 3 inhalations 3 times per day to Karen Snyder  B. Use Proventil HFA if needed  5. Return to clinic in 6 months or earlier if problem  6. Call clinic in 4 weeks about response to [facility]  7. Obtain fall flu vaccine  I have given Karen Snyder a sample of triple inhaler to utilize in the  place of her Asmanex and she will continue to treat her reflux with a proton pump inhibitor and we will see what happens over the course of the next 4 weeks.  Certainly her most recent event appears to be a viral respiratory tract infection that she contracted from her husband and hopefully as she moves forward all of that hyperreactive airway event will resolve.  Laurette Schimke, MD Allergy / Immunology [facility]

## 2020-01-30 ENCOUNTER — Encounter: Payer: Self-pay | Admitting: Allergy and Immunology

## 2020-02-02 ENCOUNTER — Telehealth: Payer: Self-pay | Admitting: Allergy and Immunology

## 2020-02-02 NOTE — Telephone Encounter (Signed)
Please inform patient that the side effects are probably from the triple inhaler, Breztri.  We will probably not be able to use this inhaler.  We will probably need to go back on Asmanex which is only one ingredient.  But, did this inhaler help her cough?

## 2020-02-02 NOTE — Telephone Encounter (Signed)
Patient informed.  She said that she was still coughing with the [facility] but did not have the feeling of having to cough anything up like she did before starting the [place].

## 2020-02-02 NOTE — Telephone Encounter (Signed)
Karen Snyder called and stated he has been taking Breztri for a week.  Karen Snyder first dose was Friday morning on 01/27/2020.  Karen Snyder states after taking Breztri, Karen Snyder hands immediately start shaking and doesn't stop until the end of the day.  Karen Snyder also states on Saturday she started getting cramping in Karen Snyder ribs and isn't sure if that is from the [place].  Karen Snyder would like some guidance on what to do.  Please advise.

## 2020-02-23 ENCOUNTER — Encounter: Payer: Self-pay | Admitting: Allergy and Immunology

## 2020-02-23 ENCOUNTER — Other Ambulatory Visit: Payer: Self-pay

## 2020-02-23 ENCOUNTER — Ambulatory Visit (INDEPENDENT_AMBULATORY_CARE_PROVIDER_SITE_OTHER): Payer: Medicare PPO | Admitting: Allergy and Immunology

## 2020-02-23 VITALS — BP 144/72 | HR 91 | Resp 18

## 2020-02-23 DIAGNOSIS — J4540 Moderate persistent asthma, uncomplicated: Secondary | ICD-10-CM | POA: Diagnosis not present

## 2020-02-23 DIAGNOSIS — J3089 Other allergic rhinitis: Secondary | ICD-10-CM | POA: Diagnosis not present

## 2020-02-23 DIAGNOSIS — K219 Gastro-esophageal reflux disease without esophagitis: Secondary | ICD-10-CM

## 2020-02-23 MED ORDER — AMITRIPTYLINE HCL 10 MG PO TABS: ORAL_TABLET | ORAL | 5 refills | Status: DC

## 2020-02-23 NOTE — Patient Instructions (Addendum)
  1. Continue to Treat reflux:   A.  Minimize caffeine and chocolate consumption    B.  Dexilant 60 mg 1 time a day in a.m.   2. Continue to Treat inflammation:   A.  Asmanex 200 HFA - 2 inhalations 2 time per day    B. OTC Rhinocort - one spray each nostril once a day  3. If needed:   A. Proventil HFA 2 puffs every 4-6 hours  B. OTC antihistamine  C. OTC Mucinex DM  D. Nasal saline  E. OTC Pataday-1 drop each eye 1 time per day  4. "action plan" for asthma flare up:   A. Increase Asmanex to 3 inhalations 3 times per day  B. Use Proventil HFA if needed  5. Start amitriptyline 10 mg - slowly work up to 30 mg (3 tablets) at night.   6. Return to clinic in 6 months or earlier if problem  7. Obtain fall flu vaccine

## 2020-02-23 NOTE — Progress Notes (Signed)
[name]   Follow-up Note  Referring Provider: Hadley Pen, MD Primary Provider: Hadley Pen, MD Date of Office Visit: 02/23/2020  Subjective:   Karen Snyder (DOB: January 25, 1941) is a 79 y.o. female who [facility]/2020 in re-evaluation of the following:  HPI: Karen Snyder returns to this clinic in reevaluation of multifactorial form of cough with contribution from asthma and allergic rhinitis and LPR.  Her last visit to this clinic was 26 January 2020 at which point in time we started her on a triple inhaler to replace her Asmanex.  She was unable to tolerate her triple inhaler because she developed tremor and she is now back on Asmanex.  She has the same degree of cough that she has had in the past.  She did not really think that the triple inhaler helped her very much.  She is interested in retrying amitriptyline for cough suppression.  When she used amitriptyline in the past it did suppress her cough but she developed two side effects.  One side effect had to do with the fact that she thought it created swelling in her lower extremities any other side effects was gastrointestinal upset.  She was able to tolerate 30 mg without much problem. When she went up to 60 mg she developed side effects.  She would like to start this amitriptyline in anticipation of having cataract surgery.  Allergies as of 02/23/2020   No Known Allergies     Medication List    albuterol 108 (90 Base) MCG/ACT inhaler Commonly known as: Proventil HFA Inhale two puffs every four to six hours as needed for cough or wheeze.   amLODipine 10 MG tablet Commonly known as: NORVASC 1 tablet daily   Asmanex HFA 200 MCG/ACT Aero Generic drug: Mometasone Furoate INHALE 2 PUFFS TWICE DAILY. USE WITH SPACER. RINSE, GARGLE, AND SPIT AFTER USE   BIOTIN FORTE PO Take 1 tablet by mouth daily.   calcium-vitamin D 500-200 MG-UNIT  tablet Commonly known as: OSCAL WITH D Take 1 tablet by mouth daily.   Dexilant 60 MG capsule Generic drug: dexlansoprazole Take 60 mg by mouth in the morning.   dextromethorphan-guaiFENesin 30-600 MG 12hr tablet Commonly known as: MUCINEX DM Take by mouth.   MULTI-VITAMIN DAILY PO Take 1 tablet by mouth daily.   Rhinocort Allergy 32 MCG/ACT nasal spray Generic drug: budesonide Place 1 spray into both nostrils daily.   telmisartan 80 MG tablet Commonly known as: MICARDIS       Past Medical History:  Diagnosis Date  . Asthma   . GERD (gastroesophageal reflux disease)   . High blood pressure     Past Surgical History:  Procedure Laterality Date  . ADENOIDECTOMY  1946  . HERNIA REPAIR  2016  . TONSILLECTOMY  1946    Review of systems negative except as noted in HPI / PMHx or noted below:  Review of Systems  Constitutional: Negative.   HENT: Negative.   Eyes: Negative.   Respiratory: Negative.   Cardiovascular: Negative.   Gastrointestinal: Negative.   Genitourinary: Negative.   Musculoskeletal: Negative.   Skin: Negative.   Neurological: Negative.   Endo/Heme/Allergies: Negative.   Psychiatric/Behavioral: Negative.      Objective:   Vitals:   02/23/20 1558  BP: (!) 144/72  Pulse: 91  Resp: 18  SpO2: 96%          Physical Exam Constitutional:  Appearance: She is not diaphoretic.  HENT:     Head: Normocephalic.     Right Ear: Tympanic membrane, ear canal and external ear normal.     Left Ear: Tympanic membrane, ear canal and external ear normal.     Nose: Nose normal. No mucosal edema or rhinorrhea.     Mouth/Throat:     Pharynx: Uvula midline. No oropharyngeal exudate.  Eyes:     Conjunctiva/sclera: Conjunctivae normal.  Neck:     Thyroid: No thyromegaly.     Trachea: Trachea normal. No tracheal tenderness or tracheal deviation.  Cardiovascular:     Rate and Rhythm: Normal rate and regular rhythm.     Heart sounds: Normal heart  sounds, S1 normal and S2 normal. No murmur heard.   Pulmonary:     Effort: No respiratory distress.     Breath sounds: Normal breath sounds. No stridor. No wheezing or rales.  Lymphadenopathy:     Head:     Right side of head: No tonsillar adenopathy.     Left side of head: No tonsillar adenopathy.     Cervical: No cervical adenopathy.  Skin:    Findings: No erythema or rash.     Nails: There is no clubbing.  Neurological:     Mental Status: She is alert.     Diagnostics: none   Assessment and Plan:   1. Not well controlled moderate persistent asthma   2. Other allergic rhinitis   3. LPRD (laryngopharyngeal reflux disease)      1. Continue to Treat reflux:   A.  Minimize caffeine and chocolate consumption    B.  Dexilant 60 mg 1 time a day in a.m.   2. Continue to Treat inflammation:   A.  Asmanex 200 HFA - 2 inhalations 2 time per day    B. OTC Rhinocort - one spray each nostril once a day  3. If needed:   A. Proventil HFA 2 puffs every 4-6 hours  B. OTC antihistamine  C. OTC Mucinex DM  D. Nasal saline  E. OTC Pataday-1 drop each eye 1 time per day  4. "action plan" for asthma flare up:   A. Increase Asmanex to 3 inhalations 3 times per day  B. Use Proventil HFA if needed  5. Start amitriptyline 10 mg - slowly work up to 30 mg (3 tablets) at night.   6. Return to clinic in 6 months or earlier if problem  7. Obtain fall flu vaccine  I will have Yissel utilize the therapy noted above which includes continued use of anti-inflammatory medications for her airway and continued therapy directed against reflux.  We will start her on amitriptyline slowly working up to 30 mg/day and she will keep in contact with [place] noting her response to this form of therapy aimed at suppressing her cough.  Laurette Schimke, MD Allergy / Immunology [facility]

## 2020-02-27 ENCOUNTER — Encounter: Payer: Self-pay | Admitting: Allergy and Immunology

## 2020-03-06 ENCOUNTER — Other Ambulatory Visit: Payer: Self-pay | Admitting: Allergy and Immunology

## 2020-07-30 ENCOUNTER — Ambulatory Visit: Payer: Medicare PPO | Admitting: Allergy and Immunology

## 2020-07-30 ENCOUNTER — Encounter: Payer: Self-pay | Admitting: Allergy and Immunology

## 2020-07-30 ENCOUNTER — Other Ambulatory Visit: Payer: Self-pay

## 2020-07-30 VITALS — BP 130/82 | HR 92 | Resp 16

## 2020-07-30 DIAGNOSIS — J3089 Other allergic rhinitis: Secondary | ICD-10-CM | POA: Diagnosis not present

## 2020-07-30 DIAGNOSIS — J4530 Mild persistent asthma, uncomplicated: Secondary | ICD-10-CM

## 2020-07-30 DIAGNOSIS — K219 Gastro-esophageal reflux disease without esophagitis: Secondary | ICD-10-CM

## 2020-07-30 NOTE — Patient Instructions (Addendum)
  1. Continue to Treat reflux:   A.  Minimize caffeine and chocolate consumption    B.  Dexilant 60 mg 1 time a day in a.m.   2. Continue to Treat inflammation:   A.  Asmanex 200 HFA - 2 inhalations 2 time per day    B. OTC Rhinocort - one spray each nostril once a day  3. If needed:   A. Proventil HFA 2 puffs every 4-6 hours  B. OTC antihistamine  C. OTC Mucinex DM  D. Nasal saline  E. OTC Pataday-1 drop each eye 1 time per day  4. "action plan" for asthma flare up:   A. Increase Asmanex to 3 inhalations 3 times per day  B. Use Proventil HFA if needed  5. Return to clinic in 6 months or earlier if problem  6. Merck medication for cough???

## 2020-07-30 NOTE — Progress Notes (Signed)
[place] - [place] - [place] - [place] - [name]   Follow-up Note  Referring Provider: Hadley Pen, MD Primary Provider: Hadley Pen, MD Date of Office Visit: 07/30/2020  Subjective:   Karen Snyder (DOB: Dec 13, 1940) is a 80 y.o. female who [facility] 07[facility]/2020 in re-evaluation of the following:  HPI: Karen Snyder returns to this clinic in evaluation of multifactorial form of cough with contribution from asthma and allergic rhinitis and LPR.  Her last visit to this clinic was 23 February 2020.  During her last visit we tried to have her use amitriptyline to suppress her cough as this therapy had have been successful in the past although she did have side effects.  She cannot really remember the exact reason but she did discontinue her amitriptyline once agian because it gave rise to some side effects.  Thus, she is about the same.  She has a chronic cough.  This appears to be somewhat improved when she uses a combination of anti-inflammatory agents for airway and therapy directed against reflux.  She has not required a systemic steroid or antibiotic for any type of airway issue.  She rarely uses a short acting bronchodilator.  She has had no problems with her nose.  She has had no problems with reflux  She has received 3 Pfizer Covid vaccines and the flu vaccine this year.  Allergies as of 07/30/2020   No Known Allergies     Medication List      albuterol 108 (90 Base) MCG/ACT inhaler Commonly known as: Proventil HFA Inhale two puffs every four to six hours as needed for cough or wheeze.   amitriptyline 10 MG tablet Commonly known as: ELAVIL Take three tablets (30mg ) by mouth every night as directed.   amLODipine 10 MG tablet Commonly known as: NORVASC 1 tablet daily   Asmanex HFA 200 MCG/ACT Aero Generic drug: Mometasone Furoate INHALE 2 PUFFS TWICE DAILY. USE WITH SPACER. RINSE, GARGLE, AND SPIT AFTER USE   BIOTIN FORTE  PO Take 1 tablet by mouth daily.   budesonide 32 MCG/ACT nasal spray Commonly known as: RHINOCORT AQUA Place 1 spray into both nostrils daily.   calcium-vitamin D 500-200 MG-UNIT tablet Commonly known as: OSCAL WITH D Take 1 tablet by mouth daily.   Dexilant 60 MG capsule Generic drug: dexlansoprazole Take 60 mg by mouth in the morning.   dextromethorphan-guaiFENesin 30-600 MG 12hr tablet Commonly known as: MUCINEX DM Take by mouth.   MULTI-VITAMIN DAILY PO Take 1 tablet by mouth daily.   telmisartan 80 MG tablet Commonly known as: MICARDIS       Past Medical History:  Diagnosis Date   Asthma    GERD (gastroesophageal reflux disease)    High blood pressure     Past Surgical History:  Procedure Laterality Date   ADENOIDECTOMY  1946   HERNIA REPAIR  2016   TONSILLECTOMY  1946    Review of systems negative except as noted in HPI / PMHx or noted below:  Review of Systems  Constitutional: Negative.   HENT: Negative.   Eyes: Negative.   Respiratory: Negative.   Cardiovascular: Negative.   Gastrointestinal: Negative.   Genitourinary: Negative.   Musculoskeletal: Negative.   Skin: Negative.   Neurological: Negative.   Endo/Heme/Allergies: Negative.   Psychiatric/Behavioral: Negative.      Objective:   Vitals:   07/30/20 1146  BP: 130/82  Pulse: 92  Resp: 16  SpO2: 98%  Physical Exam Constitutional:      Appearance: She is not diaphoretic.  HENT:     Head: Normocephalic.     Right Ear: Tympanic membrane, ear canal and external ear normal.     Left Ear: Tympanic membrane, ear canal and external ear normal.     Nose: Nose normal. No mucosal edema or rhinorrhea.     Mouth/Throat:     Mouth: Oropharynx is clear and moist and mucous membranes are normal.     Pharynx: Uvula midline. No oropharyngeal exudate.  Eyes:     Conjunctiva/sclera: Conjunctivae normal.  Neck:     Thyroid: No thyromegaly.     Trachea: Trachea normal. No  tracheal tenderness or tracheal deviation.  Cardiovascular:     Rate and Rhythm: Normal rate and regular rhythm.     Heart sounds: Normal heart sounds, S1 normal and S2 normal. No murmur heard.   Pulmonary:     Effort: No respiratory distress.     Breath sounds: Normal breath sounds. No stridor. No wheezing or rales.  Musculoskeletal:        General: No edema.  Lymphadenopathy:     Head:     Right side of head: No tonsillar adenopathy.     Left side of head: No tonsillar adenopathy.     Cervical: No cervical adenopathy.  Skin:    Findings: No erythema or rash.     Nails: There is no clubbing.  Neurological:     Mental Status: She is alert.     Diagnostics:    Spirometry was performed and demonstrated an FEV1 of 1.41 at 81 % of predicted.  Assessment and Plan:   1. Asthma, well controlled, mild persistent   2. Other allergic rhinitis   3. LPRD (laryngopharyngeal reflux disease)      1. Continue to Treat reflux:   A.  Minimize caffeine and chocolate consumption    B.  Dexilant 60 mg 1 time a day in a.m.   2. Continue to Treat inflammation:   A.  Asmanex 200 HFA - 2 inhalations 2 time per day    B. OTC Rhinocort - one spray each nostril once a day  3. If needed:   A. Proventil HFA 2 puffs every 4-6 hours  B. OTC antihistamine  C. OTC Mucinex DM  D. Nasal saline  E. OTC Pataday-1 drop each eye 1 time per day  4. "action plan" for asthma flare up:   A. Increase Asmanex to 3 inhalations 3 times per day  B. Use Proventil HFA if needed  5. Return to clinic in 6 months or earlier if problem  6. [employer] medication for cough???  Karen Snyder appears to be doing okay on her current plan although she still continues to cough.  Hopefully in 2022 the [employer]ck product, P2 X3 receptor antagonist (Gefapixant), will be available for the treatment of chronic cough and we can try that agent for Karen Snyder.  She will return to this clinic in 6 months or earlier if there is a problem.  Karen Schimke, MD Allergy / Immunology [facility]

## 2020-07-31 ENCOUNTER — Encounter: Payer: Self-pay | Admitting: Allergy and Immunology

## 2020-11-13 ENCOUNTER — Other Ambulatory Visit: Payer: Self-pay | Admitting: Allergy and Immunology

## 2021-01-24 DIAGNOSIS — R000 Tachycardia, unspecified: Secondary | ICD-10-CM

## 2021-01-24 HISTORY — DX: Tachycardia, unspecified: R00.0

## 2021-01-25 DIAGNOSIS — I440 Atrioventricular block, first degree: Secondary | ICD-10-CM

## 2021-01-28 ENCOUNTER — Other Ambulatory Visit: Payer: Self-pay

## 2021-01-28 DIAGNOSIS — I10 Essential (primary) hypertension: Secondary | ICD-10-CM | POA: Insufficient documentation

## 2021-01-28 DIAGNOSIS — J45909 Unspecified asthma, uncomplicated: Secondary | ICD-10-CM | POA: Insufficient documentation

## 2021-01-28 DIAGNOSIS — K219 Gastro-esophageal reflux disease without esophagitis: Secondary | ICD-10-CM | POA: Insufficient documentation

## 2021-01-29 ENCOUNTER — Other Ambulatory Visit: Payer: Self-pay

## 2021-01-29 ENCOUNTER — Ambulatory Visit: Payer: Medicare PPO | Admitting: Cardiology

## 2021-01-29 ENCOUNTER — Encounter: Payer: Self-pay | Admitting: Cardiology

## 2021-01-29 VITALS — BP 124/94 | HR 144 | Ht 61.6 in | Wt 169.0 lb

## 2021-01-29 DIAGNOSIS — I480 Paroxysmal atrial fibrillation: Secondary | ICD-10-CM | POA: Diagnosis not present

## 2021-01-29 DIAGNOSIS — I10 Essential (primary) hypertension: Secondary | ICD-10-CM

## 2021-01-29 DIAGNOSIS — R5383 Other fatigue: Secondary | ICD-10-CM | POA: Diagnosis not present

## 2021-01-29 DIAGNOSIS — R0602 Shortness of breath: Secondary | ICD-10-CM | POA: Diagnosis not present

## 2021-01-29 MED ORDER — METOPROLOL SUCCINATE ER 25 MG PO TB24: 25.0000 mg | ORAL_TABLET | Freq: Two times a day (BID) | ORAL | 3 refills | Status: DC

## 2021-01-29 MED ORDER — METOPROLOL TARTRATE 25 MG PO TABS: 25.0000 mg | ORAL_TABLET | Freq: Two times a day (BID) | ORAL | 3 refills | Status: DC

## 2021-01-29 MED ORDER — FUROSEMIDE 20 MG PO TABS: ORAL_TABLET | ORAL | 3 refills | Status: DC

## 2021-01-29 MED ORDER — POTASSIUM CHLORIDE ER 10 MEQ PO TBCR: EXTENDED_RELEASE_TABLET | ORAL | 3 refills | Status: DC

## 2021-01-29 NOTE — Progress Notes (Addendum)
Cardiology Office Note:    Date:  01/29/2021   ID:  Karen Snyder, DOB [birth year], MRN [id number]  PCP:  Hadley Pen, MD  Cardiologist:  None  Electrophysiologist:  None   Referring MD: Hadley Pen, MD   Chief Complaint  Patient presents with   Shortness of Breath    History of Present Illness:    Karen Snyder is a 80 y.o. female with a hx of paroxysmal atrial fibrillation on metoprolol succinate and Eliquis, hypertension is here today to be evaluated for shortness of breath.  The patient tells me that she was at her PCP office few weeks ago when she had an EKG which showed atrial fibrillation with rapid ventricular rate she was sent to the emergency department at [facility] where she was treated with Cardizem drip subsequently converted to sinus rhythm and was taken off Cardizem due to hypertension.  The patient was placed on metoprolol succinate.  During her hospitalization she declined echocardiogram as she wanted to be discharged and she was discharged home with her husband. Since she left the hospital she tells me that she has been experiencing some worsening shortness of breath on exertion.  She does have some heart palpitations as well.  Nothing makes it better or worse.  She is to go to the Silver sneakers program at the [facility] but recently the shortness of breath is getting worse and she is unable to do that.  Past Medical History:  Diagnosis Date   Asthma    Blepharitis of upper and lower eyelids of both eyes 07/19/2019   Capsulitis of metatarsophalangeal (MTP) joint of left foot 07/01/2016   Dependent edema 06/20/2019   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/19/2019   Environmental and seasonal allergies 04/15/2018   Essential hypertension 04/15/2018   Exertional dyspnea 06/20/2019   GERD (gastroesophageal reflux disease)    GERD without esophagitis 04/15/2018   High blood pressure    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   Mild  persistent asthma without complication 04/15/2018   Peripheral drusen, bilateral 07/19/2019   Postmenopausal bleeding 08/23/2015   Rhinosinusitis 01/11/2020   Screening for colon cancer 08/15/2019   Tachycardia 01/24/2021   TMJ tenderness, right 08/15/2019    Past Surgical History:  Procedure Laterality Date   ADENOIDECTOMY  1946   HERNIA REPAIR  2016   TONSILLECTOMY  1946    Current Medications: No outpatient medications have been marked as taking for the 01/29/21 encounter (Office Visit) with Thomasene Ripple, DO.     Allergies:   Patient has no known allergies.   Social History   Socioeconomic History   Marital status: Married    Spouse name: Not on file   Number of children: Not on file   Years of education: Not on file   Highest education level: Not on file  Occupational History   Not on file  Tobacco Use   Smoking status: Never   Smokeless tobacco: Never  Substance and Sexual Activity   Alcohol use: No   Drug use: No   Sexual activity: Not on file  Other Topics Concern   Not on file  Social History Narrative   Not on file   Social Determinants of Health   Financial Resource Strain: Not on file  Food Insecurity: Not on file  Transportation Needs: Not on file  Physical Activity: Not on file  Stress: Not on file  Social Connections: Not on file     Family History: The  patient's family history includes Atrial fibrillation in her mother; Eczema in her sister; Heart Problems in her maternal grandfather and maternal grandmother; Heart attack in her father; Hypertension in her father and mother.  ROS:   Review of Systems  Constitution: Negative for decreased appetite, fever and weight gain.  HENT: Negative for congestion, ear discharge, hoarse voice and sore throat.   Eyes: Negative for discharge, redness, vision loss in right eye and visual halos.  Cardiovascular: Reports shortness of breath.  Negative for chest pain,  leg swelling, orthopnea and palpitations.   Respiratory: Negative for cough, hemoptysis, shortness of breath and snoring.   Endocrine: Negative for heat intolerance and polyphagia.  Hematologic/Lymphatic: Negative for bleeding problem. Does not bruise/bleed easily.  Skin: Negative for flushing, nail changes, rash and suspicious lesions.  Musculoskeletal: Negative for arthritis, joint pain, muscle cramps, myalgias, neck pain and stiffness.  Gastrointestinal: Negative for abdominal pain, bowel incontinence, diarrhea and excessive appetite.  Genitourinary: Negative for decreased libido, genital sores and incomplete emptying.  Neurological: Negative for brief paralysis, focal weakness, headaches and loss of balance.  Psychiatric/Behavioral: Negative for altered mental status, depression and suicidal ideas.  Allergic/Immunologic: Negative for HIV exposure and persistent infections.    EKGs/Labs/Other Studies Reviewed:    The following studies were reviewed today:   EKG:  The ekg ordered today demonstrates   Chest x-ray done at the [facility] showed no active disease which was done on January 24, 2021  Recent Labs: No results found for requested labs within last 8760 hours.  Recent Lipid Panel No results found for: CHOL, TRIG, HDL, CHOLHDL, VLDL, LDLCALC, LDLDIRECT  Physical Exam:    VS:  Ht 5' 1.6" (1.565 m)   Wt 169 lb (76.7 kg)   BMI 31.31 kg/m     Wt Readings from Last 3 Encounters:  01/29/21 169 lb (76.7 kg)  01/26/20 166 lb 6.4 oz (75.5 kg)  07/19/18 174 lb (78.9 kg)     GEN: Well nourished, well developed in no acute distress HEENT: Normal NECK: No JVD; No carotid bruits LYMPHATICS: No lymphadenopathy CARDIAC: S1S2 noted,RRR, no murmurs, rubs, gallops RESPIRATORY:  Clear to auscultation without rales, wheezing or rhonchi  ABDOMEN: Soft, non-tender, non-distended, +bowel sounds, no guarding. EXTREMITIES: No edema, No cyanosis, no clubbing MUSCULOSKELETAL:  No deformity  SKIN: Warm and dry NEUROLOGIC:   Alert and oriented x 3, non-focal PSYCHIATRIC:  Normal affect, good insight  ASSESSMENT:    No diagnosis found. PLAN:     1.  She is with rapid ventricular rate today in the office.  What I like to do is increase her beta-blocker from Lopressor 12.5 mg twice a day to 25 mg twice daily.  When an echocardiogram to assess her LV function as well as any other structural abnormalities.  Her physical exam is positive for +1 bilateral leg edema and positive JVP I am going to give gentle diuretics with Lasix 20 mg 3 times weekly with potassium supplement. She has diastolic hypertension in the office but because of the changes above I am hoping that this will help with improving her blood pressure as well.  We will get blood work today for [employer], mag as well as vitamin D level that she reports fatigue as well.  The patient is in agreement with the above plan. The patient left the office in stable condition.  The patient will follow up in 12 weeks with Dr. Dulce Sellar   Medication Adjustments/Labs and Tests Ordered: Current medicines are reviewed at length  with the patient today.  Concerns regarding medicines are outlined above.  No orders of the defined types were placed in this encounter.  No orders of the defined types were placed in this encounter.   There are no Patient Instructions on file for this visit.   Adopting a Healthy Lifestyle.  Know what a healthy weight is for you (roughly BMI <25) and aim to maintain this   Aim for 7+ servings of fruits and vegetables daily   65-80+ fluid ounces of water or unsweet tea for healthy kidneys   Limit to max 1 drink of alcohol per day; avoid smoking/tobacco   Limit animal fats in diet for cholesterol and heart health - choose grass fed whenever available   Avoid highly processed foods, and foods high in saturated/trans fats   Aim for low stress - take time to unwind and care for your mental health   Aim for 150 min of moderate intensity  exercise weekly for heart health, and weights twice weekly for bone health   Aim for 7-9 hours of sleep daily   When it comes to diets, agreement about the perfect plan isnt easy to find, even among the experts. Experts at the [facility] [employer] developed an idea known as the Healthy Eating Plate. Just imagine a plate divided into logical, healthy portions.   The emphasis is on diet quality:   Load up on vegetables and fruits - one-half of your plate: Aim for color and variety, and remember that potatoes dont count.   Go for whole grains - one-quarter of your plate: Whole wheat, barley, wheat berries, quinoa, oats, brown rice, and foods made with them. If you want pasta, go with whole wheat pasta.   Protein power - one-quarter of your plate: Fish, chicken, beans, and nuts are all healthy, versatile protein sources. Limit red meat.   The diet, however, does go beyond the plate, offering a few other suggestions.   Use healthy plant oils, such as olive, canola, soy, corn, sunflower and peanut. Check the labels, and avoid partially hydrogenated oil, which have unhealthy trans fats.   If youre thirsty, drink water. Coffee and tea are good in moderation, but skip sugary drinks and limit milk and dairy products to one or two daily servings.   The type of carbohydrate in the diet is more important than the amount. Some sources of carbohydrates, such as vegetables, fruits, whole grains, and beans-are healthier than others.   Finally, stay active  Signed, Thomasene Ripple, DO  01/29/2021 3:26 PM    [facility]

## 2021-01-29 NOTE — Patient Instructions (Addendum)
Medication Instructions:  Your physician has recommended you make the following change in your medication:  INCREASE: Lopressor 25 mg twice daily START: Lasix 20 mg Monday, Wednesday and Friday START: Potassium 10 meq Monday, Wednesday and Friday  *If you need a refill on your cardiac medications before your next appointment, please call your pharmacy*   Lab Work: Your physician recommends that you return for lab work in:  TODAY: BMET, Mag, Vitamin D  If you have labs (blood work) drawn today and your tests are completely normal, you will receive your results only by: MyChart Message (if you have MyChart) OR A paper copy in the mail If you have any lab test that is abnormal or we need to change your treatment, we will call you to review the results.   Testing/Procedures: Your physician has requested that you have an echocardiogram. Echocardiography is a painless test that uses sound waves to create images of your heart. It provides your doctor with information about the size and shape of your heart and how well your heart's chambers and valves are working. This procedure takes approximately one hour. There are no restrictions for this procedure.    Follow-Up: At [facility], you and your health needs are our priority.  As part of our continuing mission to provide you with exceptional heart care, we have created designated Provider Care Teams.  These Care Teams include your primary Cardiologist (physician) and Advanced Practice Providers (APPs -  Physician Assistants and Nurse Practitioners) who all work together to provide you with the care you need, when you need it.  We recommend signing up for the patient portal called "MyChart".  Sign up information is provided on this After Visit Summary.  MyChart is used to connect with patients for Virtual Visits (Telemedicine).  Patients are able to view lab/test results, encounter notes, upcoming appointments, etc.  Non-urgent messages can be  sent to your provider as well.   To learn more about what you can do with MyChart, go to [email or web address].    Your next appointment:   12 week(s)  The format for your next appointment:   In Person  Provider:   Norman Herrlich, MD   Other Instructions Echocardiogram An echocardiogram is a test that uses sound waves (ultrasound) to produce images of the heart. Images from an echocardiogram can provide important information about: Heart size and shape. The size and thickness and movement of your heart's walls. Heart muscle function and strength. Heart valve function or if you have stenosis. Stenosis is when the heart valves are too narrow. If blood is flowing backward through the heart valves (regurgitation). A tumor or infectious growth around the heart valves. Areas of heart muscle that are not working well because of poor blood flow or injury from a heart attack. Aneurysm detection. An aneurysm is a weak or damaged part of an artery wall. The wall bulges out from the normal force of blood pumping through the body. Tell a health care provider about: Any allergies you have. All medicines you are taking, including vitamins, herbs, eye drops, creams, and over-the-counter medicines. Any blood disorders you have. Any surgeries you have had. Any medical conditions you have. Whether you are pregnant or may be pregnant. What are the risks? Generally, this is a safe test. However, problems may occur, including an allergic reaction to dye (contrast) that may be used during the test. What happens before the test? No specific preparation is needed. You may eat and drink normally. What happens  during the test?  You will take off your clothes from the waist up and put on a hospital gown. Electrodes or electrocardiogram (ECG)patches may be placed on your chest. The electrodes or patches are then connected to a device that monitors your heart rate and rhythm. You will lie down on a table  for an ultrasound exam. A gel will be applied to your chest to help sound waves pass through your skin. A handheld device, called a transducer, will be pressed against your chest and moved over your heart. The transducer produces sound waves that travel to your heart and bounce back (or "echo" back) to the transducer. These sound waves will be captured in real-time and changed into images of your heart that can be viewed on a video monitor. The images will be recorded on a computer and reviewed by your health care provider. You may be asked to change positions or hold your breath for a short time. This makes it easier to get different views or better views of your heart. In some cases, you may receive contrast through an IV in one of your veins. This can improve the quality of the pictures from your heart. The procedure may vary among health care providers and hospitals. What can I expect after the test? You may return to your normal, everyday life, including diet, activities, andmedicines, unless your health care provider tells you not to do that. Follow these instructions at home: It is up to you to get the results of your test. Ask your health care provider, or the department that is doing the test, when your results will be ready. Keep all follow-up visits. This is important. Summary An echocardiogram is a test that uses sound waves (ultrasound) to produce images of the heart. Images from an echocardiogram can provide important information about the size and shape of your heart, heart muscle function, heart valve function, and other possible heart problems. You do not need to do anything to prepare before this test. You may eat and drink normally. After the echocardiogram is completed, you may return to your normal, everyday life, unless your health care provider tells you not to do that. This information is not intended to replace advice given to you by your health care provider. Make sure you  discuss any questions you have with your healthcare provider. Document Revised: 02/28/2020 Document Reviewed: 02/28/2020 Elsevier Patient Education  2022 [employer].

## 2021-01-29 NOTE — Addendum Note (Signed)
Addended by: Reynolds Bowl on: 01/29/2021 04:23 PM   Modules accepted: Orders

## 2021-01-30 ENCOUNTER — Ambulatory Visit: Payer: Medicare PPO | Admitting: Allergy and Immunology

## 2021-01-30 LAB — BASIC METABOLIC PANEL
BUN/Creatinine Ratio: 22 (ref 12–28)
BUN: 19 mg/dL (ref 8–27)
CO2: 22 mmol/L (ref 20–29)
Calcium: 9.4 mg/dL (ref 8.7–10.3)
Chloride: 98 mmol/L (ref 96–106)
Creatinine, Ser: 0.87 mg/dL (ref 0.57–1.00)
Glucose: 146 mg/dL — ABNORMAL HIGH (ref 65–99)
Potassium: 4.6 mmol/L (ref 3.5–5.2)
Sodium: 137 mmol/L (ref 134–144)
eGFR: 67 mL/min/{1.73_m2} (ref >59)

## 2021-01-30 LAB — MAGNESIUM: Magnesium: 2.1 mg/dL (ref 1.6–2.3)

## 2021-01-30 LAB — VITAMIN D 25 HYDROXY (VIT D DEFICIENCY, FRACTURES): Vit D, 25-Hydroxy: 44.7 ng/mL (ref 30.0–100.0)

## 2021-02-04 ENCOUNTER — Other Ambulatory Visit: Payer: Self-pay

## 2021-02-04 ENCOUNTER — Telehealth: Payer: Self-pay | Admitting: Cardiology

## 2021-02-04 ENCOUNTER — Emergency Department (HOSPITAL_BASED_OUTPATIENT_CLINIC_OR_DEPARTMENT_OTHER)
Admission: EM | Admit: 2021-02-04 | Discharge: 2021-02-04 | Disposition: A | Discharge status: Home / Self Care | Payer: Medicare PPO | Attending: Emergency Medicine | Admitting: Emergency Medicine

## 2021-02-04 ENCOUNTER — Emergency Department (HOSPITAL_BASED_OUTPATIENT_CLINIC_OR_DEPARTMENT_OTHER): Payer: Medicare PPO

## 2021-02-04 ENCOUNTER — Encounter (HOSPITAL_BASED_OUTPATIENT_CLINIC_OR_DEPARTMENT_OTHER): Payer: Self-pay

## 2021-02-04 DIAGNOSIS — Z20822 Contact with and (suspected) exposure to covid-19: Secondary | ICD-10-CM | POA: Insufficient documentation

## 2021-02-04 DIAGNOSIS — I4891 Unspecified atrial fibrillation: Secondary | ICD-10-CM | POA: Insufficient documentation

## 2021-02-04 DIAGNOSIS — J4530 Mild persistent asthma, uncomplicated: Secondary | ICD-10-CM | POA: Insufficient documentation

## 2021-02-04 DIAGNOSIS — Z79899 Other long term (current) drug therapy: Secondary | ICD-10-CM | POA: Insufficient documentation

## 2021-02-04 DIAGNOSIS — Z7901 Long term (current) use of anticoagulants: Secondary | ICD-10-CM | POA: Insufficient documentation

## 2021-02-04 DIAGNOSIS — R0602 Shortness of breath: Secondary | ICD-10-CM | POA: Insufficient documentation

## 2021-02-04 DIAGNOSIS — I10 Essential (primary) hypertension: Secondary | ICD-10-CM | POA: Insufficient documentation

## 2021-02-04 DIAGNOSIS — R000 Tachycardia, unspecified: Secondary | ICD-10-CM | POA: Diagnosis present

## 2021-02-04 HISTORY — DX: Unspecified atrial fibrillation: I48.91

## 2021-02-04 LAB — CBC WITH DIFFERENTIAL/PLATELET
Abs Immature Granulocytes: 0.03 10*3/uL (ref 0.00–0.07)
Basophils Absolute: 0.1 10*3/uL (ref 0.0–0.1)
Basophils Relative: 1 %
Eosinophils Absolute: 0.1 10*3/uL (ref 0.0–0.5)
Eosinophils Relative: 1 %
HCT: 36.8 % (ref 36.0–46.0)
Hemoglobin: 12.1 g/dL (ref 12.0–15.0)
Immature Granulocytes: 0 %
Lymphocytes Relative: 15 %
Lymphs Abs: 1.4 10*3/uL (ref 0.7–4.0)
MCH: 28.4 pg (ref 26.0–34.0)
MCHC: 32.9 g/dL (ref 30.0–36.0)
MCV: 86.4 fL (ref 80.0–100.0)
Monocytes Absolute: 0.7 10*3/uL (ref 0.1–1.0)
Monocytes Relative: 8 %
Neutro Abs: 6.9 10*3/uL (ref 1.7–7.7)
Neutrophils Relative %: 75 %
Platelets: 317 10*3/uL (ref 150–400)
RBC: 4.26 MIL/uL (ref 3.87–5.11)
RDW: 14 % (ref 11.5–15.5)
WBC: 9.2 10*3/uL (ref 4.0–10.5)
nRBC: 0 % (ref 0.0–0.2)

## 2021-02-04 LAB — BASIC METABOLIC PANEL
Anion gap: 6 (ref 5–15)
BUN: 16 mg/dL (ref 8–23)
CO2: 24 mmol/L (ref 22–32)
Calcium: 8.4 mg/dL — ABNORMAL LOW (ref 8.9–10.3)
Chloride: 101 mmol/L (ref 98–111)
Creatinine, Ser: 0.83 mg/dL (ref 0.44–1.00)
GFR, Estimated: >60 mL/min (ref >60)
Glucose, Bld: 140 mg/dL — ABNORMAL HIGH (ref 70–99)
Potassium: 4.3 mmol/L (ref 3.5–5.1)
Sodium: 131 mmol/L — ABNORMAL LOW (ref 135–145)

## 2021-02-04 LAB — TROPONIN I (HIGH SENSITIVITY)
Troponin I (High Sensitivity): 7 ng/L (ref <18)
Troponin I (High Sensitivity): 7 ng/L (ref <18)

## 2021-02-04 LAB — BRAIN NATRIURETIC PEPTIDE: B Natriuretic Peptide: 535.8 pg/mL — ABNORMAL HIGH (ref 0.0–100.0)

## 2021-02-04 LAB — MAGNESIUM: Magnesium: 2 mg/dL (ref 1.7–2.4)

## 2021-02-04 LAB — SARS CORONAVIRUS 2 (TAT 6-24 HRS): SARS Coronavirus 2: NEGATIVE

## 2021-02-04 MED ORDER — METOPROLOL TARTRATE 25 MG PO TABS
25.0000 mg | ORAL_TABLET | Freq: Once | ORAL | Status: AC
  Administered 2021-02-04: 25 mg via ORAL
  Filled 2021-02-04: qty 1

## 2021-02-04 MED ORDER — METOPROLOL TARTRATE 5 MG/5ML IV SOLN
5.0000 mg | INTRAVENOUS | Status: DC | PRN
  Administered 2021-02-04: 5 mg via INTRAVENOUS
  Filled 2021-02-04: qty 5

## 2021-02-04 MED ORDER — FUROSEMIDE 10 MG/ML IJ SOLN
20.0000 mg | Freq: Once | INTRAMUSCULAR | Status: AC
  Administered 2021-02-04: 20 mg via INTRAVENOUS
  Filled 2021-02-04: qty 2

## 2021-02-04 MED ORDER — SODIUM CHLORIDE 0.9 % IV SOLN: INTRAVENOUS | Status: DC | PRN

## 2021-02-04 MED ORDER — DILTIAZEM HCL-DEXTROSE 125-5 MG/125ML-% IV SOLN (PREMIX)
5.0000 mg/h | INTRAVENOUS | Status: DC
  Administered 2021-02-04: 10 mg/h via INTRAVENOUS
  Administered 2021-02-04: 15 mg/h via INTRAVENOUS
  Filled 2021-02-04: qty 125

## 2021-02-04 NOTE — ED Notes (Addendum)
Patient converted from A-fib to NSR, MD notified, new EKG ordered

## 2021-02-04 NOTE — Telephone Encounter (Signed)
Valerie from [facility] ED states Dr. Dykstra would like to speak with the DOD. 

## 2021-02-04 NOTE — Progress Notes (Signed)
BP 129/90   Pulse (!) 114   Temp 98 F (36.7 C) (Oral)   Resp 15   SpO22 35%  80 year old with past medical history of A. fib with RVR as per patient was admitted to the hospital for A. fib with RVR was started on potassium drip converted to sinus rhythm and discharged home on Eliquis.  Followed up with cardiology was found to be in RVR metoprolol was increased, comes back to the ED as she was feeling generally weak and short of breath was found to be 180 started on diltiazem drip IV metoprolol with an improvement in her heart rate. The case was discussed with cardiology Dr. Mcneil Sober, recommended to admit for further evaluation she was admitted to cardiac telemetry. Question if she has been compliant with her medication.

## 2021-02-04 NOTE — Telephone Encounter (Signed)
Spoke to the patient just now and she let me know that she increased her metoprolol tartrate 25 mg to twice daily last week on 01/29/2021. Her heart rate was at 158 just a couple of minutes ago. Her blood pressure is 150/60. She states that she has been SOB all week and has not felt like doing anything. Denies any chest pain, dizziness, lightheadedness.   I advised that if her heart rate remained elevated that she needs to proceed to the ED but she does not want to do this.   I will send a message to Dr. Dulce Sellar for further recommendations.

## 2021-02-04 NOTE — Telephone Encounter (Signed)
Patient c/o Palpitations:  High priority if patient c/o lightheadedness, shortness of breath, or chest pain  How long have you had palpitations/irregular HR/ Afib? Are you having the symptoms now? From 01/24/21; no   Are you currently experiencing lightheadedness, SOB or CP? SOB  Do you have a history of afib (atrial fibrillation) or irregular heart rhythm? Yes   Have you checked your BP or HR? (document readings if available): HR 158  Are you experiencing any other symptoms? High HR

## 2021-02-04 NOTE — Discharge Instructions (Addendum)
It was our pleasure to provide your ER care today - we hope that you feel better.  Continue your meds. Minimize caffeine use.   Follow up with your doctor/cardiologist in the next few days.   Return to ER if worse, new symptoms, persistent fast heart beating, chest pain, trouble breathing, or other concern.

## 2021-02-04 NOTE — ED Notes (Signed)
Cardizem gtt discontinued, per MD order

## 2021-02-04 NOTE — ED Triage Notes (Addendum)
Pt reports recent Afib dx-states her heart rate has been elevated x 1 week-was seen by cards with med adjustment-no improvement-pt denies pain-c/o feeling SOB and tired-steady gait to triage-to tx room via w/c

## 2021-02-04 NOTE — ED Provider Notes (Addendum)
Patient signed out to call hospitalists for admission re rapid afib, on gtt, rate improved.    Had discussed w hospitalists who accepted in transfer.   While awaiting transfer, pt converted to NSR. Ecg repeated, sinus, no acute st/t  changes. Pt denies any chest pain. Is breathing comfortably. Pt given po dose of her rate control med, and then gtt dc'd. Pt remains in nsr.  ?mild CHF due to earlier afib, elev bnp. Lasix 20 iv.   Pt remains in sinus rhythm. No cp or resp distress.   Pt currently appears stable for d/c.      Cathren Laine, MD 02/04/21 1756   Remains in nsr ~ 90 minutes post d/c cardizem gtt.   Pt appears stable for d/c.      Cathren Laine, MD 02/04/21 1911

## 2021-02-04 NOTE — Telephone Encounter (Addendum)
Spoke to the patient just now and let her know Dr. Hulen Shouts recommendations. She verbalizes understanding and states that she will go to the ED now.   I advised that she go to [facility] as she does not want to go to [facility] at this time.

## 2021-02-04 NOTE — ED Provider Notes (Signed)
[facility] EMERGENCY DEPARTMENT Provider Note   CSN: [id number] Arrival date & time: 02/04/21  1244     History Chief Complaint  Patient presents with   Tachycardia    Karen Snyder is a 80 y.o. female.  Presents to ER with concern for tachycardia.  Patient reports that she was admitted on 7[facility]7-per hospital for new onset atrial fibrillation.  Was started on diltiazem drip, converted to sinus rhythm and discharged.  Patient went to her cardiology clinic a few days ago and was found to be back in A. fib with RVR.  Home metoprolol was increased.  Patient states that she has continued to feel generally weak, somewhat short of breath, very poor energy.  Heart rate had some improvement but yesterday and today is more elevated.  No chest pain at present. 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   Mild persistent asthma without complication 04/15/2018   Peripheral drusen, bilateral 07/19/2019   Postmenopausal bleeding 08/23/2015   Rhinosinusitis 01/11/2020   Screening for colon cancer 08/15/2019   Tachycardia 01/24/2021   TMJ tenderness, right 08/15/2019    Patient Active Problem List   Diagnosis Date Noted   Asthma 01/28/2021   GERD (gastroesophageal reflux disease) 01/28/2021   High blood pressure 01/28/2021   Tachycardia 01/24/2021   Rhinosinusitis 01/11/2020   TMJ tenderness, right 08/15/2019   Screening for colon cancer 08/15/2019    Blepharitis of upper and lower eyelids of both eyes 07/19/2019   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/19/2019   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     Family History  Problem Relation Age of Onset   Hypertension Mother    Atrial fibrillation Mother        pacemaker   Hypertension Father    Heart attack Father    Eczema Sister    Heart Problems Maternal Grandmother    Heart Problems Maternal Grandfather     Social History   Tobacco Use   Smoking status: Never   Smokeless tobacco: Never  Substance Use Topics   Alcohol use: No   Drug use: No    Home Medications Prior to Admission medications   Medication Sig Start Date End Date Taking? Authorizing Provider  albuterol (PROVENTIL HFA) 108 (90 Base) MCG/ACT inhaler Inhale two puffs every four to six hours as needed for cough or  wheeze. 07/28/16   Kozlow, Alvira Philips, MD  apixaban (ELIQUIS) 5 MG TABS tablet Take 5 mg by mouth in the morning and at bedtime.    [provider]  BIOTIN FORTE PO Take 1 tablet by mouth daily.    [provider]  budesonide (RHINOCORT AQUA) 32 MCG/ACT nasal spray Place 1 spray into both nostrils daily.    [provider]  calcium-vitamin D (OSCAL WITH D) 500-200 MG-UNIT tablet Take 1 tablet by mouth daily.    [provider]  DEXILANT 60 MG capsule Take 60 mg by mouth in the morning.   12/28/18   [provider]  dextromethorphan-guaiFENesin (MUCINEX DM) 30-600 MG 12hr tablet Take 1 tablet by mouth 2 (two) times daily.    [provider]  furosemide (LASIX) 20 MG tablet Take on Monday, Wednesday and Friday. 01/29/21   Tobb, Kardie, DO  Melatonin 10 MG CAPS Take 10 mg by mouth at bedtime.    [provider]  metoprolol tartrate (LOPRESSOR) 25 MG tablet Take 1 tablet (25 mg total) by mouth 2 (two) times daily. 01/29/21 04/29/21  Tobb, Kardie, DO  Mometasone Furoate (ASMANEX HFA) 200 MCG/ACT AERO Inhale two doses twice daily to prevent cough or wheeze.  Inhale three doses three times daily during flare-up. Rinse, gargle, and spit after use. 11/13/20   Kozlow, Alvira Philips, MD  Multiple Vitamin (MULTI-VITAMIN DAILY PO) Take 1 tablet by mouth daily.    [provider]  potassium chloride (KLOR-CON) 10 MEQ tablet Take on Monday, Wednesday and Friday. 01/29/21   Tobb, Lavona Mound, DO    Allergies    Patient has no known allergies.  Review of Systems   Review of Systems  Constitutional:  Negative for chills and fever.  HENT:  Negative for ear pain and sore throat.   Eyes:  Negative for pain and visual disturbance.  Respiratory:  Positive for shortness of breath. Negative for cough.   Cardiovascular:  Positive for palpitations. Negative for chest pain.  Gastrointestinal:  Negative for abdominal pain and vomiting.  Genitourinary:  Negative for dysuria and hematuria.  Musculoskeletal:  Negative for arthralgias and back pain.  Skin:  Negative for color change and rash.  Neurological:  Negative for seizures and syncope.  All other systems reviewed and are negative.  Physical Exam Updated Vital Signs BP (!) 128/107 (BP Location: Left Arm)   Pulse (!) 158   Temp 98 F (36.7 C) (Oral)   Resp (!) 26   SpO2 95%   Physical Exam Vitals and nursing note reviewed.  Constitutional:      General: She is not in acute distress.    Appearance: She is well-developed.   HENT:     Head: Normocephalic and atraumatic.  Eyes:     Conjunctiva/sclera: Conjunctivae normal.  Cardiovascular:     Rate and Rhythm: Tachycardia present. Rhythm irregular.     Heart sounds: No murmur heard. Pulmonary:     Effort: Pulmonary effort is normal. No respiratory distress.     Breath sounds: Normal breath sounds.  Abdominal:     Palpations: Abdomen is soft.     Tenderness: There is no abdominal tenderness.  Musculoskeletal:        General: No swelling, deformity or signs of injury.     Cervical back: Neck supple.  Skin:    General: Skin is warm and dry.  Neurological:     General: No focal deficit present.     Mental Status: She is alert.  Psychiatric:        Mood and Affect: Mood normal.    ED Results / Procedures / Treatments   Labs (all labs ordered are listed, but only abnormal results are displayed) Labs Reviewed  BASIC METABOLIC PANEL - Abnormal; Notable for the following components:      Result Value   Sodium 131 (*)    Glucose, Bld 140 (*)    Calcium 8.4 (*)    All other components within normal limits  BRAIN NATRIURETIC PEPTIDE - Abnormal; Notable for the following components:   B Natriuretic Peptide 535.8 (*)    All other components within normal limits  CBC WITH DIFFERENTIAL/PLATELET  MAGNESIUM  TROPONIN I (HIGH SENSITIVITY)  TROPONIN I (HIGH SENSITIVITY)    EKG EKG Interpretation  Date/Time:  Monday February 04 2021 13:03:42 EDT Ventricular Rate:  159 PR Interval:    QRS Duration: 78 QT Interval:  297 QTC Calculation: 483 R Axis:   117 Text Interpretation: Supraventricular tachycardia Right axis deviation Confirmed by Marianna Fuss ([id number]) on 02/04/2021 1:09:27 PM  Radiology DG Chest Portable 1 View  Result Date: 02/04/2021 CLINICAL DATA:  Tachycardia and shortness of breath. EXAM: PORTABLE CHEST 1 VIEW COMPARISON:  Single-view of the chest and CT chest 01/24/2021. FINDINGS: The patient has small bilateral pleural effusions and  basilar atelectasis. Small focus of discoid atelectasis in the left mid lung also noted. No pneumothorax. Hiatal hernia again seen. Heart size is normal. IMPRESSION: Small bilateral pleural effusions and basilar atelectasis. Hiatal hernia. Electronically Signed   By: Drusilla Kanner M.D.   On: 02/04/2021 14:07    Procedures .Critical Care  Date/Time: 02/04/2021 3:24 PM Performed by: Milagros Loll, MD Authorized by: Milagros Loll, MD   Critical care provider statement:    Critical care time (minutes):  40   Critical care was necessary to treat or prevent imminent or life-threatening deterioration of the following conditions:  Cardiac failure   Critical care was time spent personally by me on the following activities:  Discussions with consultants, evaluation of patient's response to treatment, examination of patient, ordering and performing treatments and interventions, ordering and review of laboratory studies, ordering and review of radiographic studies, pulse oximetry, re-evaluation of patient's condition, obtaining history from patient or surrogate and review of old charts   Medications Ordered in ED Medications  metoprolol tartrate (LOPRESSOR) injection 5 mg (5 mg Intravenous Given 02/04/21 1327)  diltiazem (CARDIZEM) 125 mg in dextrose 5% 125 mL (1 mg/mL) infusion (15 mg/hr Intravenous New Bag/Given 02/04/21 1512)  0.9 %  sodium chloride infusion (has no administration in time range)    ED Course  I have reviewed the triage vital signs and the nursing notes.  Pertinent labs & imaging results that were available during my care of the patient were reviewed by me and considered in my medical decision making (see chart for details).    MDM Rules/Calculators/A&P                          80 year old lady presents to ER with concern for ongoing fatigue, shortness of breath, palpitations.  Patient Found to be in A. fib with RVR, rate in 150s.  This is despite increasing home  metoprolol dose a few days ago in the cardiology clinic.  Basic labs grossly stable.  Suspect symptoms related to her A. fib.  Tried giving some IV metoprolol and had very minimal impact on her heart rate.  Discussed case with  with her cardiology clinic, Dr. Mcneil Sober took the call -he recommends admitting patient for further management, diltiazem drip for now.  Patient has only been on anticoagulation for 11 days, not ER cardioversion candidate.    Consulted [facility] for admit.  Started dill drip.  Updated patient and husband at bedside.   Final Clinical Impression(s) / ED Diagnoses Final diagnoses:  Atrial fibrillation, unspecified type (HCC)  Atrial fibrillation with RVR (HCC)    Rx / DC Orders ED Discharge Orders     None        Milagros Loll, MD 02/04/21 1529

## 2021-02-04 NOTE — Telephone Encounter (Signed)
Tried calling the patient but the phone rant out for several minutes without going to voicemail or anyone answering. Will try again later.

## 2021-02-05 ENCOUNTER — Telehealth: Payer: Self-pay | Admitting: Cardiology

## 2021-02-05 DIAGNOSIS — I4891 Unspecified atrial fibrillation: Secondary | ICD-10-CM

## 2021-02-05 NOTE — Telephone Encounter (Signed)
    Pt had another afib episode, she was in the ED yesterday and needs to see Dr. Dulce Sellar. She said she feels very weak and needs f/u

## 2021-02-05 NOTE — Telephone Encounter (Signed)
Spoke to the patient just now and let her know Dr. Hulen Shouts recommendations. I placed the referral for her and she tells me that she needs to be seen in [place]. The referral was placed for [place] with Dr. Elberta Fortis.    Encouraged patient to call back with any questions or concerns.

## 2021-02-08 ENCOUNTER — Encounter (HOSPITAL_BASED_OUTPATIENT_CLINIC_OR_DEPARTMENT_OTHER): Payer: Self-pay | Admitting: *Deleted

## 2021-02-08 ENCOUNTER — Other Ambulatory Visit (HOSPITAL_BASED_OUTPATIENT_CLINIC_OR_DEPARTMENT_OTHER): Payer: Self-pay

## 2021-02-08 ENCOUNTER — Emergency Department (HOSPITAL_BASED_OUTPATIENT_CLINIC_OR_DEPARTMENT_OTHER): Payer: Medicare PPO

## 2021-02-08 ENCOUNTER — Emergency Department (HOSPITAL_BASED_OUTPATIENT_CLINIC_OR_DEPARTMENT_OTHER)
Admission: EM | Admit: 2021-02-08 | Discharge: 2021-02-08 | Disposition: A | Discharge status: Home / Self Care | Payer: Medicare PPO | Attending: Emergency Medicine | Admitting: Emergency Medicine

## 2021-02-08 ENCOUNTER — Telehealth: Payer: Self-pay | Admitting: Cardiology

## 2021-02-08 ENCOUNTER — Other Ambulatory Visit: Payer: Self-pay

## 2021-02-08 DIAGNOSIS — Z7901 Long term (current) use of anticoagulants: Secondary | ICD-10-CM | POA: Insufficient documentation

## 2021-02-08 DIAGNOSIS — J4530 Mild persistent asthma, uncomplicated: Secondary | ICD-10-CM | POA: Diagnosis not present

## 2021-02-08 DIAGNOSIS — I10 Essential (primary) hypertension: Secondary | ICD-10-CM | POA: Diagnosis not present

## 2021-02-08 DIAGNOSIS — I4891 Unspecified atrial fibrillation: Secondary | ICD-10-CM | POA: Insufficient documentation

## 2021-02-08 DIAGNOSIS — R002 Palpitations: Secondary | ICD-10-CM | POA: Diagnosis not present

## 2021-02-08 DIAGNOSIS — R2243 Localized swelling, mass and lump, lower limb, bilateral: Secondary | ICD-10-CM | POA: Insufficient documentation

## 2021-02-08 DIAGNOSIS — Z79899 Other long term (current) drug therapy: Secondary | ICD-10-CM | POA: Diagnosis not present

## 2021-02-08 DIAGNOSIS — I509 Heart failure, unspecified: Secondary | ICD-10-CM

## 2021-02-08 DIAGNOSIS — R000 Tachycardia, unspecified: Secondary | ICD-10-CM | POA: Diagnosis not present

## 2021-02-08 DIAGNOSIS — R0602 Shortness of breath: Secondary | ICD-10-CM | POA: Insufficient documentation

## 2021-02-08 LAB — COMPREHENSIVE METABOLIC PANEL
ALT: 60 U/L — ABNORMAL HIGH (ref 0–44)
AST: 38 U/L (ref 15–41)
Albumin: 4 g/dL (ref 3.5–5.0)
Alkaline Phosphatase: 63 U/L (ref 38–126)
Anion gap: 9 (ref 5–15)
BUN: 22 mg/dL (ref 8–23)
CO2: 23 mmol/L (ref 22–32)
Calcium: 8.9 mg/dL (ref 8.9–10.3)
Chloride: 104 mmol/L (ref 98–111)
Creatinine, Ser: 1.03 mg/dL — ABNORMAL HIGH (ref 0.44–1.00)
GFR, Estimated: 55 mL/min — ABNORMAL LOW (ref >60)
Glucose, Bld: 131 mg/dL — ABNORMAL HIGH (ref 70–99)
Potassium: 4.3 mmol/L (ref 3.5–5.1)
Sodium: 136 mmol/L (ref 135–145)
Total Bilirubin: 0.6 mg/dL (ref 0.3–1.2)
Total Protein: 7.4 g/dL (ref 6.5–8.1)

## 2021-02-08 LAB — CBC WITH DIFFERENTIAL/PLATELET
Abs Immature Granulocytes: 0.04 10*3/uL (ref 0.00–0.07)
Basophils Absolute: 0.1 10*3/uL (ref 0.0–0.1)
Basophils Relative: 1 %
Eosinophils Absolute: 0.1 10*3/uL (ref 0.0–0.5)
Eosinophils Relative: 1 %
HCT: 40 % (ref 36.0–46.0)
Hemoglobin: 13.3 g/dL (ref 12.0–15.0)
Immature Granulocytes: 0 %
Lymphocytes Relative: 18 %
Lymphs Abs: 1.8 10*3/uL (ref 0.7–4.0)
MCH: 28.7 pg (ref 26.0–34.0)
MCHC: 33.3 g/dL (ref 30.0–36.0)
MCV: 86.4 fL (ref 80.0–100.0)
Monocytes Absolute: 0.7 10*3/uL (ref 0.1–1.0)
Monocytes Relative: 7 %
Neutro Abs: 7.8 10*3/uL — ABNORMAL HIGH (ref 1.7–7.7)
Neutrophils Relative %: 73 %
Platelets: 349 10*3/uL (ref 150–400)
RBC: 4.63 MIL/uL (ref 3.87–5.11)
RDW: 14.1 % (ref 11.5–15.5)
WBC: 10.5 10*3/uL (ref 4.0–10.5)
nRBC: 0 % (ref 0.0–0.2)

## 2021-02-08 LAB — BRAIN NATRIURETIC PEPTIDE: B Natriuretic Peptide: 741.5 pg/mL — ABNORMAL HIGH (ref 0.0–100.0)

## 2021-02-08 LAB — TROPONIN I (HIGH SENSITIVITY): Troponin I (High Sensitivity): 9 ng/L (ref <18)

## 2021-02-08 MED ORDER — FUROSEMIDE 20 MG PO TABS
20.0000 mg | ORAL_TABLET | Freq: Every day | ORAL | 0 refills | Status: DC
  Filled 2021-02-08: qty 3, 3d supply, fill #0

## 2021-02-08 MED ORDER — DILTIAZEM HCL ER COATED BEADS 120 MG PO CP24
120.0000 mg | ORAL_CAPSULE | Freq: Every day | ORAL | 0 refills | Status: DC
  Filled 2021-02-08: qty 30, 30d supply, fill #0

## 2021-02-08 MED ORDER — DILTIAZEM HCL ER COATED BEADS 120 MG PO CP24
120.0000 mg | ORAL_CAPSULE | Freq: Once | ORAL | Status: AC
  Administered 2021-02-08: 120 mg via ORAL
  Filled 2021-02-08: qty 1

## 2021-02-08 MED ORDER — SODIUM CHLORIDE 0.9 % IV SOLN: INTRAVENOUS | Status: DC | PRN

## 2021-02-08 MED ORDER — DILTIAZEM HCL-DEXTROSE 125-5 MG/125ML-% IV SOLN (PREMIX)
5.0000 mg/h | INTRAVENOUS | Status: DC
  Administered 2021-02-08: 5 mg/h via INTRAVENOUS
  Filled 2021-02-08: qty 125

## 2021-02-08 MED ORDER — FUROSEMIDE 10 MG/ML IJ SOLN
40.0000 mg | Freq: Once | INTRAMUSCULAR | Status: AC
  Administered 2021-02-08: 40 mg via INTRAVENOUS
  Filled 2021-02-08: qty 4

## 2021-02-08 MED ORDER — DILTIAZEM HCL 25 MG/5ML IV SOLN
20.0000 mg | Freq: Once | INTRAVENOUS | Status: AC
  Administered 2021-02-08: 20 mg via INTRAVENOUS
  Filled 2021-02-08: qty 5

## 2021-02-08 NOTE — Discharge Instructions (Addendum)
Please stop taking metoprolol.  Please take Cardizem instead  We will increase your Lasix.  You take it Monday Wednesday Friday but I want you to take it Saturday and Sunday and also Tuesday next week  Please make an appointment with your cardiologist  Return to ER if you have worse shortness of breath, chest pain, palpitations

## 2021-02-08 NOTE — Telephone Encounter (Signed)
Spoke to Dr. Dulce Sellar directly in regards to this and he recommends that she go to the [facility] at this time. She states that she will do this now. Her husband is going to driver her up there.    Encouraged patient to call back with any questions or concerns.

## 2021-02-08 NOTE — ED Notes (Signed)
Refused COVID swab. States, " Lets just wait because I might be able to go home and I had a swab on Monday"

## 2021-02-08 NOTE — ED Triage Notes (Signed)
Atrial fib. Sob. She was seen with same 5 days ago.

## 2021-02-08 NOTE — ED Provider Notes (Signed)
[facility] EMERGENCY DEPARTMENT Provider Note   CSN: [id number] Arrival date & time: 02/08/21  1456     History Chief Complaint  Patient presents with   Atrial Fibrillation   Shortness of Breath    Estefany Goebel is a 80 y.o. female hx of afib on eliquis, HTN, here presenting with shortness of breath and palpitation.  Patient states that she was seen in the ED 4 days ago with rapid A. fib.  She received Cardizem drip and converted to sinus rhythm.  Patient went home and still has intermittent palpitations.  She woke up this morning with palpitations and heart rate went up to 150. She then came here for further evaluation.  Patient states that she is compliant with her Eliquis.  Patient has some shortness of breath especially with exertion. Patient has minimal leg swelling as well.  She was thought to have mild CHF exacerbation last visit.  Patient has follow-up with electrophysiologist beginning of August`.   The history is provided by the patient.      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   Mild persistent asthma without complication 04/15/2018   Peripheral drusen, bilateral 07/19/2019   Postmenopausal bleeding 08/23/2015   Rhinosinusitis 01/11/2020   Screening for colon cancer 08/15/2019   Tachycardia 01/24/2021   TMJ tenderness, right 08/15/2019    Patient Active Problem List   Diagnosis Date Noted   Atrial fibrillation with RVR (HCC)  02/04/2021   Asthma 01/28/2021   GERD (gastroesophageal reflux disease) 01/28/2021   High blood pressure 01/28/2021   Tachycardia 01/24/2021   Rhinosinusitis 01/11/2020   TMJ tenderness, right 08/15/2019   Screening for colon cancer 08/15/2019   Blepharitis of upper and lower eyelids of both eyes 07/19/2019   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/19/2019   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     Family History  Problem Relation Age of Onset   Hypertension Mother    Atrial fibrillation Mother        pacemaker   Hypertension Father    Heart attack Father    Eczema Sister    Heart Problems Maternal Grandmother    Heart Problems Maternal Grandfather     Social History   Tobacco Use   Smoking status: Never   Smokeless tobacco: Never  Substance Use Topics   Alcohol use: No   Drug use:  No    Home Medications Prior to Admission medications   Medication Sig Start Date End Date Taking? Authorizing Provider  albuterol (PROVENTIL HFA) 108 (90 Base) MCG/ACT inhaler Inhale two puffs every four to six hours as needed for cough or wheeze. 07/28/16   Kozlow, Alvira Philips, MD  apixaban (ELIQUIS) 5 MG TABS tablet Take 5 mg by mouth in the morning and at bedtime.    [provider]  BIOTIN FORTE PO Take 1 tablet by mouth daily.    [provider]  budesonide (RHINOCORT AQUA) 32 MCG/ACT  nasal spray Place 1 spray into both nostrils daily.    [provider]  calcium-vitamin D (OSCAL WITH D) 500-200 MG-UNIT tablet Take 1 tablet by mouth daily.    [provider]  DEXILANT 60 MG capsule Take 60 mg by mouth in the morning.  12/28/18   [provider]  dextromethorphan-guaiFENesin (MUCINEX DM) 30-600 MG 12hr tablet Take 1 tablet by mouth 2 (two) times daily.    [provider]  furosemide (LASIX) 20 MG tablet Take on Monday, Wednesday and Friday. 01/29/21   Tobb, Kardie, DO  Melatonin 10 MG CAPS Take 10 mg by mouth at bedtime.    [provider]  metoprolol tartrate (LOPRESSOR) 25 MG tablet Take 1 tablet (25 mg total) by mouth 2 (two) times daily. 01/29/21 04/29/21  Tobb, Kardie, DO  Mometasone Furoate (ASMANEX HFA) 200 MCG/ACT AERO Inhale two doses twice daily to prevent cough or wheeze.  Inhale three doses three times daily during flare-up. Rinse, gargle, and spit after use. 11/13/20   Kozlow, Alvira Philips, MD  Multiple Vitamin (MULTI-VITAMIN DAILY PO) Take 1 tablet by mouth daily.    [provider]  potassium chloride (KLOR-CON) 10 MEQ tablet Take on Monday, Wednesday and Friday. 01/29/21   Tobb, Lavona Mound, DO    Allergies    Patient has no known allergies.  Review of Systems   Review of Systems  Respiratory:  Positive for shortness of breath.   All other systems reviewed and are negative.  Physical Exam Updated Vital Signs BP 129/87   Pulse 81   Temp 97.9 F (36.6 C) (Oral)   Resp (!) 21   Ht 5\' 1"  (1.549 m)   Wt 76.7 kg   SpO2 91%   BMI 31.95 kg/m   Physical Exam Vitals and nursing note reviewed.  Constitutional:      Comments: Slightly uncomfortable  HENT:     Head: Normocephalic.  Eyes:     Extraocular Movements: Extraocular movements intact.     Pupils: Pupils are equal, round, and reactive to light.  Cardiovascular:     Rate and Rhythm: Tachycardia present. Rhythm irregular.  Pulmonary:     Comments: Diminished  bilateral bases. Abdominal:     General: Bowel sounds are normal.     Palpations: Abdomen is soft.  Musculoskeletal:        General: Normal range of motion.     Comments: 1+ edema bilaterally   Skin:    General: Skin is warm.  Neurological:     General: No focal deficit present.     Mental Status: She is oriented to person, place, and time.  Psychiatric:        Mood and Affect: Mood normal.        Behavior: Behavior normal.    ED Results / Procedures / Treatments   Labs (all labs ordered are listed, but only abnormal results are displayed) Labs Reviewed  CBC  WITH DIFFERENTIAL/PLATELET - Abnormal; Notable for the following components:      Result Value   Neutro Abs 7.8 (*)    All other components within normal limits  COMPREHENSIVE METABOLIC PANEL - Abnormal; Notable for the following components:   Glucose, Bld 131 (*)    Creatinine, Ser 1.03 (*)    ALT 60 (*)    GFR, Estimated 55 (*)    All other components within normal limits  RESP PANEL BY RT-PCR (FLU A&B, COVID) ARPGX2  BRAIN NATRIURETIC PEPTIDE  TROPONIN I (HIGH SENSITIVITY)    EKG EKG Interpretation  Date/Time:  Friday February 08 2021 15:06:07 EDT Ventricular Rate:  165 PR Interval:    QRS Duration: 66 QT Interval:  288 QTC Calculation: 477 R Axis:   116 Text Interpretation: afib Right axis deviation Abnormal ECG No significant change since last tracing Confirmed by Richardean Canal [phone]) on 02/08/2021 3:56:13 PM  Radiology No results found.  Procedures Procedures   Medications Ordered in ED Medications  diltiazem (CARDIZEM) 125 mg in dextrose 5% 125 mL (1 mg/mL) infusion (5 mg/hr Intravenous New Bag/Given 02/08/21 1538)  0.9 %  sodium chloride infusion ( Intravenous New Bag/Given 02/08/21 1532)  diltiazem (CARDIZEM CD) 24 hr capsule 120 mg (has no administration in time range)  diltiazem (CARDIZEM) injection 20 mg (20 mg Intravenous Given 02/08/21 1532)    ED Course  I have reviewed the triage vital signs  and the nursing notes.  Pertinent labs & imaging results that were available during my care of the patient were reviewed by me and considered in my medical decision making (see chart for details).    MDM Rules/Calculators/A&P                          Berline Semrad is a 80 y.o. female here presenting with shortness of breath and rapid A. fib.  Patient is rapid A. fib on arrival.  She is compliant with her Eliquis has been on Eliquis for several months. I discussed with her about cardioversion versus IV Cardizem.  She states that she would rather try Cardizem for now.  4:19 PM Patient converted to sinus rhythm after Cardizem.  Patient is feeling much better now.  She was given oral Cardizem 120 mg CD.  Discussed case with Dr. Bufford Buttner from cardiology.  He agreed with changing from metoprolol to Cardizem.  Patient is also in mild heart failure likely from A. fib.  Will give lasix as well.   5:02 PM Patient remains in sinus rate of 60. Will dc home with cardizem and add lasix 20 mg daily x 3 days. Will have her follow up with cardiology     Final Clinical Impression(s) / ED Diagnoses Final diagnoses:  None    Rx / DC Orders ED Discharge Orders     None        Charlynne Pander, MD 02/08/21 [phone]

## 2021-02-08 NOTE — Telephone Encounter (Signed)
Follow Up:      Patient said she had talked to [place] on 02-05-21 and had some more questions.

## 2021-02-08 NOTE — Telephone Encounter (Signed)
Spoke to the patient just now and she let me know that she is still not feeling well. She states that her heart rate most recently was 135 bpm that was this morning around 10 am. I asked her to take it for me right now and her blood pressure is 152/126 and her heart rate is 160 bpm. She has taken her 25 mg of lopressor already this morning along with her other medications.   She denies any chest pain but states that she gets out of breath really easy and she is very weak. She can not sleep at night because she is so uncomfortable.   I advised that she needs to go to the emergency room since her heart rate has been this high all day. She wants recommendations from Dr. Dulce Sellar.   She is scheduled to see Dr. Elberta Fortis on 03/14/2021.

## 2021-02-19 ENCOUNTER — Other Ambulatory Visit: Payer: Self-pay

## 2021-02-19 ENCOUNTER — Ambulatory Visit: Payer: Medicare PPO

## 2021-02-19 NOTE — Progress Notes (Signed)
Cardiology Office Note:    Date:  02/20/2021   ID:  Karen Snyder, DOB [birth year], MRN [id number]  PCP:  Myrlene Broker, MD  Cardiologist:  Shirlee More, MD    Referring MD: Myrlene Broker, MD    ASSESSMENT:    1. Atypical atrial flutter ([place])   2. High risk medication use   3. Chronic anticoagulation   4. Essential hypertension    PLAN:    In order of problems listed above:  Her husband is present participates in evaluation decision making.  I am unsure whether she has been persistently in atrial flutter the entire time or whether it is paroxysmal but at a minimum she has been captured for the last 5 days her rate is rapid.  We will initiate outpatient amiodarone EKG 1 week and if persistent cardioversion.  Continue rate limiting diltiazem she feels better with it and her anticoagulant.  Check thyroid studies today prior to starting amiodarone Continue anticoagulant Stable continue current treatment presently she is not taking metoprolol.   Next appointment: EKG in 1 week decision on management at that time and follow-up   Medication Adjustments/Labs and Tests Ordered: Current medicines are reviewed at length with the patient today.  Concerns regarding medicines are outlined above.  Orders Placed This Encounter  Procedures   TSH+T4F+T3Free   EKG 12-Lead    Meds ordered this encounter  Medications   amiodarone (PACERONE) 200 MG tablet    Sig: Take 2 tablets (400 mg total) by mouth 2 (two) times daily.    Dispense:  180 tablet    Refill:  3   apixaban (ELIQUIS) 5 MG TABS tablet    Sig: Take 1 tablet (5 mg total) by mouth in the morning and at bedtime.    Dispense:  180 tablet    Refill:  3     Chief Complaint  Patient presents with   Tachycardia    History of Present Illness:    Karen Snyder is a 80 y.o. female with a hx of paroxysmal atrial fibrillation anticoagulated hypertension and heart failure last seen 01/29/2021.  Compliance with diet, lifestyle and  medications: Yes  Is a very complex visit to the best of my knowledge she has been in and out of atypical atrial flutter for 1 month and now she has persistent heart rates greater than 1 20-1 30 for the last 5 days.  Her EKG on first glance looks like sinus tachycardia but its 2-1 conduction and I think was happening if she goes to the emergency room as they give her Cardizem she appears to convert in the discharge. Since she has been taking Cardizem she feels much better although initially she did have shortness of breath and weakness since resolved she has been compliant with her anticoagulant Snyder edema orthopnea chest pain or syncope. I met with her and her husband he participated in the evaluation decision making I we decided to start her on oral amiodarone 400 mg twice daily bring her back in 1 week and if she remains in his flutter rhythm plan cardioversion. We will check baseline thyroid studies today She has an appointment with EP later this month and I told her we can keep it for the time being. She has had Snyder bleeding with her anticoagulant  She has had several ED visits of rapid heart rhythm most recently 02/08/2021 and converted to sinus rhythm spontaneously.  Her high-sensitivity troponins were normal chest x-ray shows atelectasis and small bilateral effusions CBC with  a hemoglobin of 13.3. Past Medical History:  Diagnosis Date   A-fib [facility])    Asthma    Blepharitis of upper and lower eyelids of both eyes 07/19/2019   Capsulitis of metatarsophalangeal (MTP) joint of left foot 07/01/2016   Dependent edema 06/20/2019   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/19/2019   Environmental and seasonal allergies 04/15/2018   Essential hypertension 04/15/2018   Exertional dyspnea 06/20/2019   GERD (gastroesophageal reflux disease)    GERD without esophagitis 04/15/2018   High blood pressure    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   Mild persistent asthma without  complication 91/47/8295   Peripheral drusen, bilateral 07/19/2019   Postmenopausal bleeding 08/23/2015   Rhinosinusitis 01/11/2020   Screening for colon cancer 08/15/2019   Tachycardia 01/24/2021   TMJ tenderness, right 08/15/2019    Past Surgical History:  Procedure Laterality Date   ADENOIDECTOMY  1946   HERNIA REPAIR  2016   TONSILLECTOMY  1946    Current Medications: Current Meds  Medication Sig   albuterol (PROVENTIL HFA) 108 (90 Base) MCG/ACT inhaler Inhale two puffs every four to six hours as needed for cough or wheeze.   amiodarone (PACERONE) 200 MG tablet Take 2 tablets (400 mg total) by mouth 2 (two) times daily.   BIOTIN FORTE PO Take 1 tablet by mouth daily.   budesonide (RHINOCORT AQUA) 32 MCG/ACT nasal spray Place 1 spray into both nostrils daily.   calcium-vitamin D (OSCAL WITH D) 500-200 MG-UNIT tablet Take 1 tablet by mouth daily.   DEXILANT 60 MG capsule Take 60 mg by mouth in the morning.    dextromethorphan-guaiFENesin (MUCINEX DM) 30-600 MG 12hr tablet Take 1 tablet by mouth 2 (two) times daily.   diltiazem (CARDIZEM CD) 120 MG 24 hr capsule Take 1 capsule (120 mg total) by mouth daily.   furosemide (LASIX) 20 MG tablet Take 20 mg by mouth 3 (three) times a week.   Melatonin 10 MG CAPS Take 10 mg by mouth at bedtime.   metoprolol tartrate (LOPRESSOR) 25 MG tablet Take 1 tablet (25 mg total) by mouth 2 (two) times daily.   Mometasone Furoate (ASMANEX HFA) 200 MCG/ACT AERO Inhale two doses twice daily to prevent cough or wheeze.  Inhale three doses three times daily during flare-up. Rinse, gargle, and spit after use.   Multiple Vitamin (MULTI-VITAMIN DAILY PO) Take 1 tablet by mouth daily.   potassium chloride (KLOR-CON) 10 MEQ tablet Take on Monday, Wednesday and Friday.   [DISCONTINUED] apixaban (ELIQUIS) 5 MG TABS tablet Take 5 mg by mouth in the morning and at bedtime.     Allergies:   Patient has Snyder known allergies.   Social History   Socioeconomic  History   Marital status: Married    Spouse name: Not on file   Number of children: Not on file   Years of education: Not on file   Highest education level: Not on file  Occupational History   Not on file  Tobacco Use   Smoking status: Never   Smokeless tobacco: Never  Substance and Sexual Activity   Alcohol use: Snyder   Drug use: Snyder   Sexual activity: Not on file  Other Topics Concern   Not on file  Social History Narrative   Not on file   Social Determinants of Health   Financial Resource Strain: Not on file  Food Insecurity: Not on file  Transportation Needs: Not on file  Physical Activity: Not on file  Stress: Not on file  Social Connections: Not on file     Family History: The patient's family history includes Atrial fibrillation in her mother; Eczema in her sister; Heart Problems in her maternal grandfather and maternal grandmother; Heart attack in her father; Hypertension in her father and mother. ROS:   Please see the history of present illness.    All other systems reviewed and are negative.  EKGs/Labs/Other Studies Reviewed:    The following studies were reviewed today:  EKG:  EKG ordered today and personally reviewed.  The ekg ordered today demonstrates atypical atrial flutter 2 1 conduction ventricular rate 132 bpm she has right axis deviation  Recent Labs: 02/04/2021: Magnesium 2.0 02/08/2021: ALT 60; B Natriuretic Peptide 741.5; BUN 22; Creatinine, Ser 1.03; Hemoglobin 13.3; Platelets 349; Potassium 4.3; Sodium 136  Recent Lipid Panel Snyder results found for: CHOL, TRIG, HDL, CHOLHDL, VLDL, LDLCALC, LDLDIRECT  Physical Exam:    VS:  BP 120/70 (BP Location: Right Arm, Patient Position: Sitting, Cuff Size: Normal)   Pulse (!) 132   Ht _0  (1.549 m)   Wt 158 lb (71.7 kg)   SpO2 97%   BMI 29.85 kg/m     Wt Readings from Last 3 Encounters:  02/20/21 158 lb (71.7 kg)  02/08/21 169 lb 1.5 oz (76.7 kg)  01/29/21 169 lb (76.7 kg)     GEN:  Well  nourished, well developed in Snyder acute distress HEENT: Normal NECK: Snyder JVD; Snyder carotid bruits LYMPHATICS: Snyder lymphadenopathy CARDIAC: Resting tachycardia RRR, Snyder murmurs, rubs, gallops RESPIRATORY:  Clear to auscultation without rales, wheezing or rhonchi  ABDOMEN: Soft, non-tender, non-distended MUSCULOSKELETAL:  Snyder edema; Snyder deformity  SKIN: Warm and dry NEUROLOGIC:  Alert and oriented x 3 PSYCHIATRIC:  Normal affect    Signed, Shirlee More, MD  02/20/2021 10:57 AM    [place]

## 2021-02-20 ENCOUNTER — Encounter: Payer: Self-pay | Admitting: Cardiology

## 2021-02-20 ENCOUNTER — Ambulatory Visit: Payer: Medicare PPO | Admitting: Cardiology

## 2021-02-20 VITALS — BP 120/70 | HR 132 | Ht 61.0 in | Wt 158.0 lb

## 2021-02-20 DIAGNOSIS — Z7901 Long term (current) use of anticoagulants: Secondary | ICD-10-CM

## 2021-02-20 DIAGNOSIS — I10 Essential (primary) hypertension: Secondary | ICD-10-CM | POA: Diagnosis not present

## 2021-02-20 DIAGNOSIS — Z79899 Other long term (current) drug therapy: Secondary | ICD-10-CM

## 2021-02-20 DIAGNOSIS — I484 Atypical atrial flutter: Secondary | ICD-10-CM | POA: Diagnosis not present

## 2021-02-20 MED ORDER — APIXABAN 5 MG PO TABS: 5.0000 mg | ORAL_TABLET | Freq: Two times a day (BID) | ORAL | 3 refills | Status: DC

## 2021-02-20 MED ORDER — AMIODARONE HCL 200 MG PO TABS: 400.0000 mg | ORAL_TABLET | Freq: Two times a day (BID) | ORAL | 3 refills | Status: DC

## 2021-02-20 NOTE — Patient Instructions (Signed)
Medication Instructions:  Your physician has recommended you make the following change in your medication:  START: Amiodarone 400 mg per 2 tablets by mouth twice daily.  *If you need a refill on your cardiac medications before your next appointment, please call your pharmacy*   Lab Work: Your physician recommends that you return for lab work in: TODAY TSH, T3, T4 If you have labs (blood work) drawn today and your tests are completely normal, you will receive your results only by: MyChart Message (if you have MyChart) OR A paper copy in the mail If you have any lab test that is abnormal or we need to change your treatment, we will call you to review the results.   Testing/Procedures: None   Follow-Up: At [facility], you and your health needs are our priority.  As part of our continuing mission to provide you with exceptional heart care, we have created designated Provider Care Teams.  These Care Teams include your primary Cardiologist (physician) and Advanced Practice Providers (APPs -  Physician Assistants and Nurse Practitioners) who all work together to provide you with the care you need, when you need it.  We recommend signing up for the patient portal called "MyChart".  Sign up information is provided on this After Visit Summary.  MyChart is used to connect with patients for Virtual Visits (Telemedicine).  Patients are able to view lab/test results, encounter notes, upcoming appointments, etc.  Non-urgent messages can be sent to your provider as well.   To learn more about what you can do with MyChart, go to [email or web address].    Your next appointment:   Will decide after EKG appointment next week.   The format for your next appointment:   In Person  Provider:   Norman Herrlich, MD   Other Instructions

## 2021-02-21 LAB — TSH+T4F+T3FREE
Free T4: 1.48 ng/dL (ref 0.82–1.77)
T3, Free: 3.1 pg/mL (ref 2.0–4.4)
TSH: 2.49 u[IU]/mL (ref 0.450–4.500)

## 2021-02-27 ENCOUNTER — Other Ambulatory Visit: Payer: Self-pay

## 2021-02-27 ENCOUNTER — Ambulatory Visit (INDEPENDENT_AMBULATORY_CARE_PROVIDER_SITE_OTHER): Payer: Medicare PPO

## 2021-02-27 VITALS — BP 140/90 | HR 81 | Ht 61.0 in | Wt 160.0 lb

## 2021-02-27 DIAGNOSIS — I10 Essential (primary) hypertension: Secondary | ICD-10-CM | POA: Diagnosis not present

## 2021-02-27 NOTE — Progress Notes (Signed)
Patient came in to have an EKG one week after starting amiodarone. Dr. Dulce Sellar reviewed the EKG and states that it is normal at this time. The patient was advised of this and did not have any further questions.

## 2021-03-05 ENCOUNTER — Other Ambulatory Visit: Payer: Self-pay

## 2021-03-05 MED ORDER — DILTIAZEM HCL ER COATED BEADS 120 MG PO CP24: 120.0000 mg | ORAL_CAPSULE | Freq: Every day | ORAL | 3 refills | Status: DC

## 2021-03-11 ENCOUNTER — Ambulatory Visit (INDEPENDENT_AMBULATORY_CARE_PROVIDER_SITE_OTHER): Payer: Medicare PPO

## 2021-03-11 ENCOUNTER — Other Ambulatory Visit: Payer: Self-pay

## 2021-03-11 DIAGNOSIS — I480 Paroxysmal atrial fibrillation: Secondary | ICD-10-CM | POA: Diagnosis not present

## 2021-03-11 DIAGNOSIS — R0602 Shortness of breath: Secondary | ICD-10-CM

## 2021-03-11 DIAGNOSIS — I10 Essential (primary) hypertension: Secondary | ICD-10-CM

## 2021-03-11 LAB — ECHOCARDIOGRAM COMPLETE
Area-P 1/2: 4.71 cm2
S' Lateral: 2.4 cm

## 2021-03-14 ENCOUNTER — Other Ambulatory Visit: Payer: Self-pay

## 2021-03-14 ENCOUNTER — Encounter: Payer: Self-pay | Admitting: Cardiology

## 2021-03-14 ENCOUNTER — Ambulatory Visit: Payer: Medicare PPO | Admitting: Cardiology

## 2021-03-14 VITALS — BP 138/74 | HR 75 | Ht 61.0 in | Wt 161.2 lb

## 2021-03-14 DIAGNOSIS — I4891 Unspecified atrial fibrillation: Secondary | ICD-10-CM | POA: Diagnosis not present

## 2021-03-14 MED ORDER — LOSARTAN POTASSIUM 50 MG PO TABS: 50.0000 mg | ORAL_TABLET | Freq: Every day | ORAL | 3 refills | Status: DC

## 2021-03-14 MED ORDER — AMIODARONE HCL 200 MG PO TABS: 400.0000 mg | ORAL_TABLET | Freq: Every day | ORAL | 3 refills | Status: DC

## 2021-03-14 NOTE — Patient Instructions (Signed)
Medication Instructions:  DECREASE AMIODARONE TO 400 MG DAILY FOR 2 WEEKS THEN TO 200 MG DAILY THEREAFTER START LOSARTAN 50 MG EVERY DAY  *If you need a refill on your cardiac medications before your next appointment, please call your pharmacy*   Lab Work: BMET 2 WEEKS AROUND 9/822 If you have labs (blood work) drawn today and your tests are completely normal, you will receive your results only by: MyChart Message (if you have MyChart) OR A paper copy in the mail If you have any lab test that is abnormal or we need to change your treatment, we will call you to review the results.   Testing/Procedures: NONE   Follow-Up: At [facility], you and your health needs are our priority.  As part of our continuing mission to provide you with exceptional heart care, we have created designated Provider Care Teams.  These Care Teams include your primary Cardiologist (physician) and Advanced Practice Providers (APPs -  Physician Assistants and Nurse Practitioners) who all work together to provide you with the care you need, when you need it.  We recommend signing up for the patient portal called "MyChart".  Sign up information is provided on this After Visit Summary.  MyChart is used to connect with patients for Virtual Visits (Telemedicine).  Patients are able to view lab/test results, encounter notes, upcoming appointments, etc.  Non-urgent messages can be sent to your provider as well.   To learn more about what you can do with MyChart, go to [email or web address].    Your next appointment:   3 month(s)  The format for your next appointment:   In Person  Provider:  DR Elberta Fortis

## 2021-03-14 NOTE — Progress Notes (Signed)
Electrophysiology Office Note   Date:  03/14/2021   ID:  Karen Snyder, DOB [birth year], MRN [id number]  PCP:  Hadley Pen, MD  Cardiologist:  Dulce Sellar Primary Electrophysiologist:  Azzam Mehra Jorja Loa, MD    Chief Complaint: AF   History of Present Illness: Karen Snyder is a 80 y.o. female who is being seen today for the evaluation of AF at the request of Dulce Sellar, Iline Oven, MD. Presenting today for electrophysiology evaluation.  She has a history significant for atrial fibrillation, hypertension.  She has been having episodes of atrial fibrillation and atrial flutter.  She converted to sinus rhythm with IV Cardizem.  She has been started on amiodarone per her primary cardiologist.  Today, she denies symptoms of palpitations, chest pain, shortness of breath, orthopnea, PND, lower extremity edema, claudication, dizziness, presyncope, syncope, bleeding, or neurologic sequela. The patient is tolerating medications without difficulties.  She currently feels well.  She has no chest pain or shortness of breath.  Her fatigue and shortness of breath improved with improvement in her heart rate.  She is also been put on amiodarone which is converted her to sinus rhythm.  Her only complaint is mild fatigue.  She is on high-dose amiodarone currently.   Past Medical History:  Diagnosis Date   A-fib [facility])    Asthma    Blepharitis of upper and lower eyelids of both eyes 07/19/2019   Capsulitis of metatarsophalangeal (MTP) joint of left foot 07/01/2016   Dependent edema 06/20/2019   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/19/2019   Environmental and seasonal allergies 04/15/2018   Essential hypertension 04/15/2018   Exertional dyspnea 06/20/2019   GERD (gastroesophageal reflux disease)    GERD without esophagitis 04/15/2018   High blood pressure    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   Mild persistent asthma without complication 04/15/2018   Peripheral drusen, bilateral  07/19/2019   Postmenopausal bleeding 08/23/2015   Rhinosinusitis 01/11/2020   Screening for colon cancer 08/15/2019   Tachycardia 01/24/2021   TMJ tenderness, right 08/15/2019   Past Surgical History:  Procedure Laterality Date   ADENOIDECTOMY  1946   HERNIA REPAIR  2016   TONSILLECTOMY  1946     Current Outpatient Medications  Medication Sig Dispense Refill   albuterol (PROVENTIL HFA) 108 (90 Base) MCG/ACT inhaler Inhale two puffs every four to six hours as needed for cough or wheeze. 1 Inhaler 1   apixaban (ELIQUIS) 5 MG TABS tablet Take 1 tablet (5 mg total) by mouth in the morning and at bedtime. 180 tablet 3   BIOTIN FORTE PO Take 1 tablet by mouth daily.     budesonide (RHINOCORT AQUA) 32 MCG/ACT nasal spray Place 1 spray into both nostrils daily.     calcium-vitamin D (OSCAL WITH D) 500-200 MG-UNIT tablet Take 1 tablet by mouth daily.     DEXILANT 60 MG capsule Take 60 mg by mouth in the morning.      dextromethorphan-guaiFENesin (MUCINEX DM) 30-600 MG 12hr tablet Take 1 tablet by mouth 2 (two) times daily.     diltiazem (CARDIZEM CD) 120 MG 24 hr capsule Take 1 capsule (120 mg total) by mouth daily. 90 capsule 3   furosemide (LASIX) 20 MG tablet Take 20 mg by mouth 3 (three) times a week.     losartan (COZAAR) 50 MG tablet Take 1 tablet (50 mg total) by mouth daily. 90 tablet 3   Melatonin 10 MG CAPS Take 10 mg by mouth at  bedtime.     Mometasone Furoate (ASMANEX HFA) 200 MCG/ACT AERO Inhale two doses twice daily to prevent cough or wheeze.  Inhale three doses three times daily during flare-up. Rinse, gargle, and spit after use. 13 g 3   Multiple Vitamin (MULTI-VITAMIN DAILY PO) Take 1 tablet by mouth daily.     potassium chloride (KLOR-CON) 10 MEQ tablet Take on Monday, Wednesday and Friday. 40 tablet 3   amiodarone (PACERONE) 200 MG tablet Take 2 tablets (400 mg total) by mouth daily. 400 MG FOR 2 WEEKS THEN DECREASE TO 200 MG THEREAFTER 180 tablet 3   No current  facility-administered medications for this visit.    Allergies:   Patient has no known allergies.   Social History:  The patient  reports that she has never smoked. She has never used smokeless tobacco. She reports that she does not drink alcohol and does not use drugs.   Family History:  The patient's family history includes Atrial fibrillation in her mother; Eczema in her sister; Heart Problems in her maternal grandfather and maternal grandmother; Heart attack in her father; Hypertension in her father and mother.    ROS:  Please see the history of present illness.   Otherwise, review of systems is positive for none.   All other systems are reviewed and negative.    PHYSICAL EXAM: VS:  BP 138/74   Pulse 75   Ht 5\' 1"  (1.549 m)   Wt 161 lb 3.2 oz (73.1 kg)   SpO2 98%   BMI 30.46 kg/m  , BMI Body mass index is 30.46 kg/m. GEN: Well nourished, well developed, in no acute distress  HEENT: normal  Neck: no JVD, carotid bruits, or masses Cardiac: RRR; no murmurs, rubs, or gallops,no edema  Respiratory:  clear to auscultation bilaterally, normal work of breathing GI: soft, nontender, nondistended, + BS MS: no deformity or atrophy  Skin: warm and dry Neuro:  Strength and sensation are intact Psych: euthymic mood, full affect  EKG:  EKG is ordered today. Personal review of the ekg ordered shows sinus rhythm, rate 70  Recent Labs: 02/04/2021: Magnesium 2.0 02/08/2021: ALT 60; B Natriuretic Peptide 741.5; BUN 22; Creatinine, Ser 1.03; Hemoglobin 13.3; Platelets 349; Potassium 4.3; Sodium 136 02/20/2021: TSH 2.490    Lipid Panel  No results found for: CHOL, TRIG, HDL, CHOLHDL, VLDL, LDLCALC, LDLDIRECT   Wt Readings from Last 3 Encounters:  03/14/21 161 lb 3.2 oz (73.1 kg)  02/27/21 160 lb (72.6 kg)  02/20/21 158 lb (71.7 kg)      Other studies Reviewed: Additional studies/ records that were reviewed today include: TTE 03/11/21  Review of the above records today demonstrates:    1. Left ventricular ejection fraction, by estimation, is 60 to 65%. The  left ventricle has normal function. The left ventricle has no regional  wall motion abnormalities. Left ventricular diastolic parameters are  consistent with Grade III diastolic  dysfunction (restrictive). The average left ventricular global  longitudinal strain is -11.2 %. The global longitudinal strain is  abnormal.   2. Right ventricular systolic function is normal. The right ventricular  size is normal. There is normal pulmonary artery systolic pressure.   3. Left atrial size was mildly dilated.   4. The mitral valve is normal in structure. Mild mitral valve  regurgitation. No evidence of mitral stenosis.   5. The aortic valve is normal in structure. Aortic valve regurgitation is  not visualized. Mild aortic valve sclerosis is present, with no evidence  of aortic valve stenosis.   6. The inferior vena cava is normal in size with greater than 50%  respiratory variability, suggesting right atrial pressure of 3 mmHg.   ASSESSMENT AND PLAN:  1.  Atrial fibrillation/atypical atrial flutter: Currently on amiodarone and Eliquis.  CHA2DS2-VASc of 4.  High risk medication monitoring.  She is currently feeling well and has converted back to normal rhythm.  This point she is happy with her amiodarone.  She does feel fatigued and thus we Karen Snyder reduce her dose to 400 mg a day for 2 weeks followed by 200 mg a day.  2.  Hypertension: Elevated in clinic today.  She brings in recordings from home that also show elevated blood pressures.  We Karen Snyder start losartan 50 mg.  Karen Snyder order a basic metabolic to ensure stable kidney function.  Case discussed with primary cardiology  Current medicines are reviewed at length with the patient today.   The patient does not have concerns regarding her medicines.  The following changes were made today: Reduce amiodarone, start losartan  Labs/ tests ordered today include: None Orders Placed This  Encounter  Procedures   Basic metabolic panel   EKG 12-Lead     Disposition:   FU with Karen Snyder 3 months  Signed, Karen Snyder Jorja Loa, MD  03/14/2021 11:29 AM     [facility] [address] [place] [address] [phone] (office) [phone] (fax)

## 2021-03-22 ENCOUNTER — Telehealth: Payer: Self-pay | Admitting: Cardiology

## 2021-03-22 MED ORDER — LOSARTAN POTASSIUM 50 MG PO TABS: 50.0000 mg | ORAL_TABLET | Freq: Two times a day (BID) | ORAL | 3 refills | Status: DC

## 2021-03-22 NOTE — Telephone Encounter (Signed)
Returned call to Pt.  Advised to increase losartan 50 mg to BID.  (Refer to general cardiology for further blood pressure issues)

## 2021-03-22 NOTE — Telephone Encounter (Signed)
Left message for patient to call back  

## 2021-03-22 NOTE — Telephone Encounter (Signed)
Follow Up:     Patient is returning Renata Caprice 's call from today.

## 2021-03-22 NOTE — Telephone Encounter (Signed)
See phone note encounter for orders and return call.

## 2021-03-27 ENCOUNTER — Other Ambulatory Visit: Payer: Self-pay

## 2021-03-27 DIAGNOSIS — I4891 Unspecified atrial fibrillation: Secondary | ICD-10-CM

## 2021-03-28 LAB — BASIC METABOLIC PANEL
BUN/Creatinine Ratio: 14 (ref 12–28)
BUN: 11 mg/dL (ref 8–27)
CO2: 24 mmol/L (ref 20–29)
Calcium: 9 mg/dL (ref 8.7–10.3)
Chloride: 96 mmol/L (ref 96–106)
Creatinine, Ser: 0.78 mg/dL (ref 0.57–1.00)
Glucose: 117 mg/dL — ABNORMAL HIGH (ref 65–99)
Potassium: 3.9 mmol/L (ref 3.5–5.2)
Sodium: 135 mmol/L (ref 134–144)
eGFR: 77 mL/min/{1.73_m2} (ref >59)

## 2021-04-04 DIAGNOSIS — M545 Low back pain, unspecified: Secondary | ICD-10-CM

## 2021-04-04 HISTORY — DX: Low back pain, unspecified: M54.50

## 2021-04-16 ENCOUNTER — Other Ambulatory Visit: Payer: Self-pay

## 2021-04-16 ENCOUNTER — Telehealth: Payer: Self-pay

## 2021-04-16 ENCOUNTER — Ambulatory Visit (INDEPENDENT_AMBULATORY_CARE_PROVIDER_SITE_OTHER): Payer: Medicare PPO

## 2021-04-16 VITALS — BP 142/88 | HR 124 | Resp 18 | Ht 61.0 in | Wt 161.6 lb

## 2021-04-16 DIAGNOSIS — I4891 Unspecified atrial fibrillation: Secondary | ICD-10-CM | POA: Diagnosis not present

## 2021-04-16 NOTE — Telephone Encounter (Signed)
Followed up with patient. BP still elevated at home readings: 164/93, 141/99. HRs at home 110s - 120s. Pt going to follow up with PCP about BP control and to have BP cuff checked to ensure accuracy. Pt advised to continue monitoring HRs.  Advised will forward to [facility] for review and advisement. Aware will call once he answers. Pt agreeable to plan.

## 2021-04-16 NOTE — Telephone Encounter (Signed)
This pt stopped by this am and requested her BP and hr be checked. I done a nurse visit. Pt states that her heart rate started increasing yesterday. She states that she saw Dr. Elberta Fortis and he increased her amiodarone for 2 weeks and then she decreased it. Can you look at this and see if we need to do anything?

## 2021-04-16 NOTE — Progress Notes (Signed)
Reason for visit: elevated heart rate. Pt stopped by the office and states since her medication has been adjusted her heart rate has been increasing since 04/15/21. Pt denies chest pain or shortness of breath.  Name of MD requesting visit: Munley   H&P: atrial fib   ROS related to problem: atrial fib with recent change to amiodarone  Assessment and plan per MD: Camnitz/Munley

## 2021-05-08 NOTE — Progress Notes (Signed)
Cardiology Office Note:    Date:  05/09/2021   ID:  Karen Snyder, DOB 03/04/1941, MRN [id number]  PCP:  Hadley Pen, MD  Cardiologist:  Norman Herrlich, MD    Referring MD: Hadley Pen, MD    ASSESSMENT:    1. Atypical atrial flutter (HCC)   2. Chronic anticoagulation   3. On amiodarone therapy   4. High risk medication use   5. Essential hypertension   6. Need for immunization against influenza    PLAN:    In order of problems listed above:  [name] had a good response to amiodarone oral loading converted to sinus rhythm is on low-dose maintenance without toxicity and will continue anticoagulation.  Today we will check CMP and thyroid studies to screen for toxicity.  She will continue her current anticoagulant low-dose amiodarone. BP remains elevated losartan is a relatively weak antihypertensive agent and we will switch her to a high potency ARB I expect her systolics to be less than 140 Given influenza immunization today at her request   Next appointment: 3 months   Medication Adjustments/Labs and Tests Ordered: Current medicines are reviewed at length with the patient today.  Concerns regarding medicines are outlined above.  Orders Placed This Encounter  Procedures   Flu Vaccine QUAD High Dose(Fluad)   Comprehensive metabolic panel   OLI+D0V+U1THYH   EKG 12-Lead   Meds ordered this encounter  Medications   amiodarone (PACERONE) 200 MG tablet    Sig: Take 1 tablet (200 mg total) by mouth daily.    Dispense:  90 tablet    Refill:  3   telmisartan (MICARDIS) 40 MG tablet    Sig: Take 1 tablet (40 mg total) by mouth daily.    Dispense:  90 tablet    Refill:  3   Chief complaint: Follow-up for atrial flutter on amiodarone   History of Present Illness:    Karen Snyder is a 80 y.o. female with a hx of atypical atrial flutter with rapid ventricular rate with chronic anticoagulation and hypertension last seen 02/20/2021 and initiated on oral amiodarone.  She  has several ED visits prior to that with rapid atrial flutter and fibrillation converting to sinus rhythm spontaneously.  Compliance with diet, lifestyle and medications: Yes  She converted to sinus rhythm with amiodarone and is now taking 200 mg/day.  She is on an anticoagulant without bleeding complication. She communicated by phone with the patient is now taking losartan 100 mg daily.  Blood pressure remains greater than 150 systolic at home Overall she feels improved no palpitations syncope shortness of breath edema or chest pain  Other studies Reviewed: Additional studies/ records that were reviewed today include: TTE 03/11/21  Review of the above records today demonstrates:   1. Left ventricular ejection fraction, by estimation, is 60 to 65%. The  left ventricle has normal function. The left ventricle has no regional  wall motion abnormalities. Left ventricular diastolic parameters are  consistent with Grade III diastolic  dysfunction (restrictive). The average left ventricular global  longitudinal strain is -11.2 %. The global longitudinal strain is  abnormal.   2. Right ventricular systolic function is normal. The right ventricular  size is normal. There is normal pulmonary artery systolic pressure.   3. Left atrial size was mildly dilated.   4. The mitral valve is normal in structure. Mild mitral valve  regurgitation. No evidence of mitral stenosis.   5. The aortic valve is normal in structure. Aortic valve regurgitation is  not visualized. Mild aortic valve sclerosis is present, with no evidence  of aortic valve stenosis.   6. The inferior vena cava is normal in size with greater than 50%  respiratory variability, suggesting right atrial pressure of 3 mmHg. Past Medical History:  Diagnosis Date   A-fib [facility])    Asthma    Blepharitis of upper and lower eyelids of both eyes 07/19/2019   Capsulitis of metatarsophalangeal (MTP) joint of left foot 07/01/2016   Dependent edema  06/20/2019   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/19/2019   Environmental and seasonal allergies 04/15/2018   Essential hypertension 04/15/2018   Exertional dyspnea 06/20/2019   GERD (gastroesophageal reflux disease)    GERD without esophagitis 04/15/2018   High blood pressure    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   Mild persistent asthma without complication 04/15/2018   Peripheral drusen, bilateral 07/19/2019   Postmenopausal bleeding 08/23/2015   Rhinosinusitis 01/11/2020   Screening for colon cancer 08/15/2019   Tachycardia 01/24/2021   TMJ tenderness, right 08/15/2019    Past Surgical History:  Procedure Laterality Date   ADENOIDECTOMY  1946   HERNIA REPAIR  2016   TONSILLECTOMY  1946    Current Medications: Current Meds  Medication Sig   albuterol (PROVENTIL HFA) 108 (90 Base) MCG/ACT inhaler Inhale two puffs every four to six hours as needed for cough or wheeze.   apixaban (ELIQUIS) 5 MG TABS tablet Take 1 tablet (5 mg total) by mouth in the morning and at bedtime.   BIOTIN FORTE PO Take 1 tablet by mouth daily.   budesonide (RHINOCORT AQUA) 32 MCG/ACT nasal spray Place 1 spray into both nostrils daily.   calcium-vitamin D (OSCAL WITH D) 500-200 MG-UNIT tablet Take 1 tablet by mouth daily.   DEXILANT 60 MG capsule Take 60 mg by mouth in the morning.    dextromethorphan-guaiFENesin (MUCINEX DM) 30-600 MG 12hr tablet Take 1 tablet by mouth 2 (two) times daily as needed for cough.   diltiazem (CARDIZEM CD) 120 MG 24 hr capsule Take 1 capsule (120 mg total) by mouth daily.   furosemide (LASIX) 20 MG tablet Take 20 mg by mouth 3 (three) times a week.   Melatonin 10 MG CAPS Take 10 mg by mouth at bedtime.   Mometasone Furoate (ASMANEX HFA) 200 MCG/ACT AERO Inhale two doses twice daily to prevent cough or wheeze.  Inhale three doses three times daily during flare-up. Rinse, gargle, and spit after use.   Multiple Vitamin (MULTI-VITAMIN DAILY PO)  Take 1 tablet by mouth daily.   potassium chloride (KLOR-CON) 10 MEQ tablet Take on Monday, Wednesday and Friday.   telmisartan (MICARDIS) 40 MG tablet Take 1 tablet (40 mg total) by mouth daily.   [DISCONTINUED] amiodarone (PACERONE) 200 MG tablet Take 2 tablets (400 mg total) by mouth daily. 400 MG FOR 2 WEEKS THEN DECREASE TO 200 MG THEREAFTER   [DISCONTINUED] losartan (COZAAR) 50 MG tablet Take 1 tablet (50 mg total) by mouth 2 (two) times daily.     Allergies:   Patient has no known allergies.   Social History   Socioeconomic History   Marital status: Married    Spouse name: Not on file   Number of children: Not on file   Years of education: Not on file   Highest education level: Not on file  Occupational History   Not on file  Tobacco Use   Smoking status: Never   Smokeless tobacco: Never  Substance and Sexual  Activity   Alcohol use: No   Drug use: No   Sexual activity: Not on file  Other Topics Concern   Not on file  Social History Narrative   Not on file   Social Determinants of Health   Financial Resource Strain: Not on file  Food Insecurity: Not on file  Transportation Needs: Not on file  Physical Activity: Not on file  Stress: Not on file  Social Connections: Not on file     Family History: The patient's family history includes Atrial fibrillation in her mother; Eczema in her sister; Heart Problems in her maternal grandfather and maternal grandmother; Heart attack in her father; Hypertension in her father and mother. ROS:   Please see the history of present illness.    All other systems reviewed and are negative.  EKGs/Labs/Other Studies Reviewed:    The following studies were reviewed today:  EKG:  EKG ordered today and personally reviewed.  The ekg ordered today demonstrates sinus rhythm right axis deviation otherwise normal QT interval is normal  Recent Labs: 02/04/2021: Magnesium 2.0 02/08/2021: ALT 60; B Natriuretic Peptide 741.5; Hemoglobin 13.3;  Platelets 349 02/20/2021: TSH 2.490 03/27/2021: BUN 11; Creatinine, Ser 0.78; Potassium 3.9; Sodium 135  Recent Lipid Panel No results found for: CHOL, TRIG, HDL, CHOLHDL, VLDL, LDLCALC, LDLDIRECT  Physical Exam:    VS:  BP (!) 147/84 (BP Location: Right Arm, Patient Position: Sitting) Comment: Patients home monitior  Pulse 77   Ht 5\' 1"  (1.549 m)   Wt 164 lb (74.4 kg)   SpO2 97%   BMI 30.99 kg/m     Wt Readings from Last 3 Encounters:  05/09/21 164 lb (74.4 kg)  04/16/21 161 lb 9.6 oz (73.3 kg)  03/14/21 161 lb 3.2 oz (73.1 kg)     GEN:  Well nourished, well developed in no acute distress HEENT: Normal NECK: No JVD; No carotid bruits LYMPHATICS: No lymphadenopathy CARDIAC: RRR, no murmurs, rubs, gallops RESPIRATORY:  Clear to auscultation without rales, wheezing or rhonchi  ABDOMEN: Soft, non-tender, non-distended MUSCULOSKELETAL:  No edema; No deformity  SKIN: Warm and dry NEUROLOGIC:  Alert and oriented x 3 PSYCHIATRIC:  Normal affect    Signed, 03/16/21, MD  05/09/2021 2:33 PM    [facility]

## 2021-05-09 ENCOUNTER — Other Ambulatory Visit: Payer: Self-pay

## 2021-05-09 ENCOUNTER — Encounter: Payer: Self-pay | Admitting: Cardiology

## 2021-05-09 ENCOUNTER — Ambulatory Visit: Payer: Medicare PPO | Admitting: Cardiology

## 2021-05-09 VITALS — BP 147/84 | HR 77 | Ht 61.0 in | Wt 164.0 lb

## 2021-05-09 DIAGNOSIS — I484 Atypical atrial flutter: Secondary | ICD-10-CM | POA: Diagnosis not present

## 2021-05-09 DIAGNOSIS — I10 Essential (primary) hypertension: Secondary | ICD-10-CM | POA: Diagnosis not present

## 2021-05-09 DIAGNOSIS — Z7901 Long term (current) use of anticoagulants: Secondary | ICD-10-CM | POA: Diagnosis not present

## 2021-05-09 DIAGNOSIS — Z79899 Other long term (current) drug therapy: Secondary | ICD-10-CM

## 2021-05-09 DIAGNOSIS — Z23 Encounter for immunization: Secondary | ICD-10-CM | POA: Diagnosis not present

## 2021-05-09 MED ORDER — AMIODARONE HCL 200 MG PO TABS: 200.0000 mg | ORAL_TABLET | Freq: Every day | ORAL | 3 refills | Status: DC

## 2021-05-09 MED ORDER — TELMISARTAN 40 MG PO TABS: 40.0000 mg | ORAL_TABLET | Freq: Every day | ORAL | 3 refills | Status: DC

## 2021-05-09 NOTE — Patient Instructions (Signed)
Medication Instructions:  Your physician has recommended you make the following change in your medication:  STOP: Losartan START: Telmisartan 40 mg take one tablet by mouth daily.  *If you need a refill on your cardiac medications before your next appointment, please call your pharmacy*   Lab Work: None If you have labs (blood work) drawn today and your tests are completely normal, you will receive your results only by: MyChart Message (if you have MyChart) OR A paper copy in the mail If you have any lab test that is abnormal or we need to change your treatment, we will call you to review the results.   Testing/Procedures: None   Follow-Up: At [facility], you and your health needs are our priority.  As part of our continuing mission to provide you with exceptional heart care, we have created designated Provider Care Teams.  These Care Teams include your primary Cardiologist (physician) and Advanced Practice Providers (APPs -  Physician Assistants and Nurse Practitioners) who all work together to provide you with the care you need, when you need it.  We recommend signing up for the patient portal called "MyChart".  Sign up information is provided on this After Visit Summary.  MyChart is used to connect with patients for Virtual Visits (Telemedicine).  Patients are able to view lab/test results, encounter notes, upcoming appointments, etc.  Non-urgent messages can be sent to your provider as well.   To learn more about what you can do with MyChart, go to [email or web address].    Your next appointment:   3 month(s)  The format for your next appointment:   In Person  Provider:   Norman Herrlich, MD   Other Instructions

## 2021-05-10 ENCOUNTER — Telehealth: Payer: Self-pay

## 2021-05-10 LAB — COMPREHENSIVE METABOLIC PANEL
ALT: 14 IU/L (ref 0–32)
AST: 17 IU/L (ref 0–40)
Albumin/Globulin Ratio: 1.7 (ref 1.2–2.2)
Albumin: 4.7 g/dL (ref 3.7–4.7)
Alkaline Phosphatase: 77 IU/L (ref 44–121)
BUN/Creatinine Ratio: 17 (ref 12–28)
BUN: 17 mg/dL (ref 8–27)
Bilirubin Total: 0.4 mg/dL (ref 0.0–1.2)
CO2: 21 mmol/L (ref 20–29)
Calcium: 9.5 mg/dL (ref 8.7–10.3)
Chloride: 98 mmol/L (ref 96–106)
Creatinine, Ser: 1 mg/dL (ref 0.57–1.00)
Globulin, Total: 2.7 g/dL (ref 1.5–4.5)
Glucose: 97 mg/dL (ref 70–99)
Potassium: 4.6 mmol/L (ref 3.5–5.2)
Sodium: 135 mmol/L (ref 134–144)
Total Protein: 7.4 g/dL (ref 6.0–8.5)
eGFR: 57 mL/min/{1.73_m2} — ABNORMAL LOW (ref >59)

## 2021-05-10 LAB — TSH+T4F+T3FREE
Free T4: 1.54 ng/dL (ref 0.82–1.77)
T3, Free: 2.1 pg/mL (ref 2.0–4.4)
TSH: 5.84 u[IU]/mL — ABNORMAL HIGH (ref 0.450–4.500)

## 2021-05-10 NOTE — Telephone Encounter (Signed)
Left message on patients voicemail to please return our call.   

## 2021-05-10 NOTE — Telephone Encounter (Signed)
Spoke with patient regarding results and recommendation.  Patient verbalizes understanding and is agreeable to plan of care. Advised patient to call back with any issues or concerns.  

## 2021-05-10 NOTE — Telephone Encounter (Signed)
Patient returned call for results 

## 2021-05-10 NOTE — Telephone Encounter (Signed)
-----   Message from Brian J Munley, MD sent at 05/10/2021 10:31 AM EDT ----- Normal or stable result  On amiodarone no changes 

## 2021-05-13 ENCOUNTER — Telehealth: Payer: Self-pay

## 2021-05-13 NOTE — Telephone Encounter (Signed)
Spoke with patient regarding results and recommendation.  Patient verbalizes understanding and is agreeable to plan of care. Advised patient to call back with any issues or concerns.  

## 2021-05-13 NOTE — Telephone Encounter (Signed)
-----   Message from Brian J Munley, MD sent at 05/10/2021 10:31 AM EDT ----- Normal or stable result  On amiodarone no changes 

## 2021-05-14 ENCOUNTER — Telehealth: Payer: Self-pay

## 2021-05-14 NOTE — Telephone Encounter (Signed)
-----   Message from Baldo Daub, MD sent at 05/10/2021 10:31 AM EDT ----- Normal or stable result  On amiodarone no changes

## 2021-05-14 NOTE — Telephone Encounter (Signed)
Left message on patients voicemail to please return our call.   Letter mailed to patient at this time.  

## 2021-06-17 ENCOUNTER — Other Ambulatory Visit: Payer: Self-pay

## 2021-06-17 ENCOUNTER — Encounter: Payer: Self-pay | Admitting: Cardiology

## 2021-06-17 ENCOUNTER — Ambulatory Visit: Payer: Medicare PPO | Admitting: Cardiology

## 2021-06-17 VITALS — BP 146/78 | HR 79 | Ht 62.0 in | Wt 168.8 lb

## 2021-06-17 DIAGNOSIS — R0602 Shortness of breath: Secondary | ICD-10-CM | POA: Diagnosis not present

## 2021-06-17 DIAGNOSIS — I4819 Other persistent atrial fibrillation: Secondary | ICD-10-CM | POA: Diagnosis not present

## 2021-06-17 NOTE — Patient Instructions (Signed)
Medication Instructions:  Your physician recommends that you continue on your current medications as directed. Please refer to the Current Medication list given to you today.  *If you need a refill on your cardiac medications before your next appointment, please call your pharmacy*   Lab Work: Today: BNP If you have labs (blood work) drawn today and your tests are completely normal, you will receive your results only by: MyChart Message (if you have MyChart) OR A paper copy in the mail If you have any lab test that is abnormal or we need to change your treatment, we will call you to review the results.   Testing/Procedures: None ordered   Follow-Up: At [facility], you and your health needs are our priority.  As part of our continuing mission to provide you with exceptional heart care, we have created designated Provider Care Teams.  These Care Teams include your primary Cardiologist (physician) and Advanced Practice Providers (APPs -  Physician Assistants and Nurse Practitioners) who all work together to provide you with the care you need, when you need it.   Your next appointment:   as  needed  The format for your next appointment:   In Person  Provider:   Loman Brooklyn, MD    Thank you for choosing [facility]!!   Dory Horn, RN [phone]

## 2021-06-17 NOTE — Progress Notes (Signed)
Electrophysiology Office Note   Date:  06/17/2021   ID:  Karen Snyder, DOB 07-07-1941, MRN [id number]  PCP:  Hadley Pen, MD  Cardiologist:  Dulce Sellar Primary Electrophysiologist:  Luree Palla Jorja Loa, MD    Chief Complaint: AF   History of Present Illness: Karen Snyder is a 80 y.o. female who is being seen today for the evaluation of AF at the request of Hadley Pen, MD. Presenting today for electrophysiology evaluation.  She has a history significant for atrial fibrillation and hypertension.  She has been having episodes of atrial fibrillation and atrial flutter.  She converted to sinus rhythm with IV diltiazem.  She has been started on amiodarone by her primary cardiologist.  Today, denies symptoms of palpitations, chest pain, orthopnea, PND, lower extremity edema, claudication, dizziness, presyncope, syncope, bleeding, or neurologic sequela. The patient is tolerating medications without difficulties.  She remains in sinus rhythm.  She does not feel that she has been in atrial fibrillation.  Unfortunately, she is more short of breath today.  She says her shortness of breath has been going on over the last few weeks.  This is not necessarily related to the start of her amiodarone.  She is short of breath doing just about any activity.  She is short of breath when walking on flat ground.  She is not short of breath at rest, and sleeps on 1 pillow without waking up short of breath.   Past Medical History:  Diagnosis Date   A-fib [facility])    Asthma    Blepharitis of upper and lower eyelids of both eyes 07/19/2019   Capsulitis of metatarsophalangeal (MTP) joint of left foot 07/01/2016   Dependent edema 06/20/2019   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/19/2019   Environmental and seasonal allergies 04/15/2018   Essential hypertension 04/15/2018   Exertional dyspnea 06/20/2019   GERD (gastroesophageal reflux disease)    GERD without esophagitis 04/15/2018   High blood  pressure    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   Mild persistent asthma without complication 04/15/2018   Peripheral drusen, bilateral 07/19/2019   Postmenopausal bleeding 08/23/2015   Rhinosinusitis 01/11/2020   Screening for colon cancer 08/15/2019   Tachycardia 01/24/2021   TMJ tenderness, right 08/15/2019   Past Surgical History:  Procedure Laterality Date   ADENOIDECTOMY  1946   HERNIA REPAIR  2016   TONSILLECTOMY  1946     Current Outpatient Medications  Medication Sig Dispense Refill   albuterol (PROVENTIL HFA) 108 (90 Base) MCG/ACT inhaler Inhale two puffs every four to six hours as needed for cough or wheeze. 1 Inhaler 1   amiodarone (PACERONE) 200 MG tablet Take 1 tablet (200 mg total) by mouth daily. 90 tablet 3   apixaban (ELIQUIS) 5 MG TABS tablet Take 1 tablet (5 mg total) by mouth in the morning and at bedtime. 180 tablet 3   BIOTIN FORTE PO Take 1 tablet by mouth daily.     budesonide (RHINOCORT AQUA) 32 MCG/ACT nasal spray Place 1 spray into both nostrils daily.     calcium-vitamin D (OSCAL WITH D) 500-200 MG-UNIT tablet Take 1 tablet by mouth daily.     DEXILANT 60 MG capsule Take 60 mg by mouth in the morning.      dextromethorphan-guaiFENesin (MUCINEX DM) 30-600 MG 12hr tablet Take 1 tablet by mouth 2 (two) times daily as needed for cough.     diltiazem (CARDIZEM CD) 120 MG 24 hr capsule Take 1 capsule (  120 mg total) by mouth daily. 90 capsule 3   furosemide (LASIX) 20 MG tablet Take 20 mg by mouth 3 (three) times a week.     Melatonin 10 MG CAPS Take 10 mg by mouth at bedtime.     Mometasone Furoate (ASMANEX HFA) 200 MCG/ACT AERO Inhale two doses twice daily to prevent cough or wheeze.  Inhale three doses three times daily during flare-up. Rinse, gargle, and spit after use. 13 g 3   Multiple Vitamin (MULTI-VITAMIN DAILY PO) Take 1 tablet by mouth daily.     potassium chloride (KLOR-CON) 10 MEQ tablet Take on Monday, Wednesday and Friday. 40  tablet 3   telmisartan (MICARDIS) 40 MG tablet Take 1 tablet (40 mg total) by mouth daily. 90 tablet 3   No current facility-administered medications for this visit.    Allergies:   Patient has no known allergies.   Social History:  The patient  reports that she has never smoked. She has never used smokeless tobacco. She reports that she does not drink alcohol and does not use drugs.   Family History:  The patient's family history includes Atrial fibrillation in her mother; Eczema in her sister; Heart Problems in her maternal grandfather and maternal grandmother; Heart attack in her father; Hypertension in her father and mother.    ROS:  Please see the history of present illness.   Otherwise, review of systems is positive for none.   All other systems are reviewed and negative.   PHYSICAL EXAM: VS:  BP (!) 146/78   Pulse 79   Ht 5\' 2"  (1.575 m)   Wt 168 lb 12.8 oz (76.6 kg)   SpO2 92%   BMI 30.87 kg/m  , BMI Body mass index is 30.87 kg/m. GEN: Well nourished, well developed, in no acute distress  HEENT: normal  Neck: no JVD, carotid bruits, or masses Cardiac: RRR; no murmurs, rubs, or gallops,no edema  Respiratory:  clear to auscultation bilaterally, normal work of breathing GI: soft, nontender, nondistended, + BS MS: no deformity or atrophy  Skin: warm and dry Neuro:  Strength and sensation are intact Psych: euthymic mood, full affect  EKG:  EKG is not ordered today. Personal review of the ekg ordered shows sinus rhythm  Recent Labs: 02/04/2021: Magnesium 2.0 02/08/2021: B Natriuretic Peptide 741.5; Hemoglobin 13.3; Platelets 349 05/09/2021: ALT 14; BUN 17; Creatinine, Ser 1.00; Potassium 4.6; Sodium 135; TSH 5.840    Lipid Panel  No results found for: CHOL, TRIG, HDL, CHOLHDL, VLDL, LDLCALC, LDLDIRECT   Wt Readings from Last 3 Encounters:  06/17/21 168 lb 12.8 oz (76.6 kg)  05/09/21 164 lb (74.4 kg)  04/16/21 161 lb 9.6 oz (73.3 kg)      Other studies  Reviewed: Additional studies/ records that were reviewed today include: TTE 03/11/21  Review of the above records today demonstrates:   1. Left ventricular ejection fraction, by estimation, is 60 to 65%. The  left ventricle has normal function. The left ventricle has no regional  wall motion abnormalities. Left ventricular diastolic parameters are  consistent with Grade III diastolic  dysfunction (restrictive). The average left ventricular global  longitudinal strain is -11.2 %. The global longitudinal strain is  abnormal.   2. Right ventricular systolic function is normal. The right ventricular  size is normal. There is normal pulmonary artery systolic pressure.   3. Left atrial size was mildly dilated.   4. The mitral valve is normal in structure. Mild mitral valve  regurgitation. No evidence  of mitral stenosis.   5. The aortic valve is normal in structure. Aortic valve regurgitation is  not visualized. Mild aortic valve sclerosis is present, with no evidence  of aortic valve stenosis.   6. The inferior vena cava is normal in size with greater than 50%  respiratory variability, suggesting right atrial pressure of 3 mmHg.   ASSESSMENT AND PLAN:  1.  Atrial fibrillation/atypical atrial flutter: Currently on amiodarone 200 mg daily and Eliquis 5 mg twice daily.  High risk medication monitoring.  CHA2DS2-VASc of 4.  She remains in sinus rhythm.  We Karen Snyder continue with current management.  As she is back in normal rhythm, Karen Snyder transfer her care back to her primary cardiologist.  If she goes into atrial fibrillation again or if there are other needs, I would be happy to see her back.  2.  Hypertension: Blood pressure is elevated today.  3.  Shortness of breath: It is unclear to me if the cause of her shortness of breath at this time.  She does state that she has gained weight.  He does not appear volume overloaded on exam.  We Karen Snyder get a BNP today.  If this is nonrevealing, chest x-ray may be  warranted.  Current medicines are reviewed at length with the patient today.   The patient does not have concerns regarding her medicines.  The following changes were made today: None  Labs/ tests ordered today include:  Orders Placed This Encounter  Procedures   Pro b natriuretic peptide (BNP)      Disposition:   FU with Karen Snyder as needed months  Signed, Karen Bernath Meredith Leeds, MD  06/17/2021 11:01 AM     [facility] [address] [place] [address] [phone] (office) [phone] (fax)

## 2021-06-19 ENCOUNTER — Telehealth: Payer: Self-pay | Admitting: *Deleted

## 2021-06-19 NOTE — Telephone Encounter (Signed)
Results from [facility] came back stating not enough blood to run test. Pt will need to be redrawn so need a new order put in and pt called to come back. Please advise

## 2021-06-20 ENCOUNTER — Other Ambulatory Visit: Payer: Self-pay | Admitting: Allergy and Immunology

## 2021-06-21 LAB — PRO B NATRIURETIC PEPTIDE: NT-Pro BNP: 499 pg/mL (ref 0–738)

## 2021-06-24 NOTE — Telephone Encounter (Signed)
Lab resulted

## 2021-07-01 ENCOUNTER — Encounter: Payer: Self-pay | Admitting: Cardiology

## 2021-07-18 ENCOUNTER — Other Ambulatory Visit: Payer: Self-pay

## 2021-07-18 ENCOUNTER — Encounter: Payer: Self-pay | Admitting: Allergy and Immunology

## 2021-07-18 ENCOUNTER — Telehealth: Payer: Self-pay

## 2021-07-18 ENCOUNTER — Ambulatory Visit: Payer: Medicare PPO | Admitting: Allergy and Immunology

## 2021-07-18 VITALS — BP 164/80 | HR 76 | Temp 97.9°F | Resp 16 | Ht 62.0 in | Wt 168.0 lb

## 2021-07-18 DIAGNOSIS — J3089 Other allergic rhinitis: Secondary | ICD-10-CM | POA: Diagnosis not present

## 2021-07-18 DIAGNOSIS — R0902 Hypoxemia: Secondary | ICD-10-CM | POA: Diagnosis not present

## 2021-07-18 DIAGNOSIS — J4530 Mild persistent asthma, uncomplicated: Secondary | ICD-10-CM

## 2021-07-18 DIAGNOSIS — K219 Gastro-esophageal reflux disease without esophagitis: Secondary | ICD-10-CM

## 2021-07-18 MED ORDER — ASMANEX HFA 200 MCG/ACT IN AERO: INHALATION_SPRAY | RESPIRATORY_TRACT | 5 refills | Status: DC

## 2021-07-18 NOTE — Addendum Note (Signed)
Addended by: Alphonzo Cruise on: 07/18/2021 02:46 PM   Modules accepted: Orders

## 2021-07-18 NOTE — Telephone Encounter (Signed)
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

## 2021-07-18 NOTE — Telephone Encounter (Signed)
I called Karen Snyder and let her know that I talked with Dr. Hulen Shouts office and they do not have any sooner appointments than January 20th.  They will, however, put her name on their cancellation call list and contact her if a sooner appointment becomes available. Patient agreed with plan.

## 2021-07-18 NOTE — Progress Notes (Signed)
Patient had 91% oxygen saturation on room air while resting.  After walking for approximately 45 seconds, oxygen saturation dropped to 85% ( on room air). Started patient on 2 L nasal cannula.  Rechecked oxygen saturation in approximately 5 minutes and it had risen to 97% ( on oxygen).  Had patient walk again, this time with oxygen and her oxygen saturation dropped to 88%.  Had patient sit down again, and oxygen did raise to 97% ( on oxygen).

## 2021-07-18 NOTE — Telephone Encounter (Signed)
Visit Type: CONSULT [1001] Provider: Karren Burly, MD [id number] Dept: [facility]  Date: 07/30/21 Arrival Time: 9:00 AM Time: 9:15 AM  This is the first appt their office was able to do. I contacted the patient to inform her but she said the time is a little to early for her to go to [place]. She is going to call their office now to get it moved to another time or day.   [address] [place], [place] [address] Main Line: [phone]

## 2021-07-18 NOTE — Progress Notes (Signed)
[facility]   Follow-up Note  Referring Provider: Myrlene Broker, MD Primary Provider: Myrlene Broker, MD Date of Office Visit: 07/18/2021  Subjective:   Karen Snyder (DOB: 03-13-1941) is a 80 y.o. female who returns to the [facility] on 07/18/2021 in re-evaluation of the following:  HPI: [name] returns to this clinic in reevaluation of cough with contribution from asthma, allergic rhinitis, and LPR.  I have not seen her in his clinic since 30 July 2020.  During the interval she has had an episode of A. fib requiring amiodarone and Eliquis administration July 2022 and is now in sinus rhythm.  Since that A. fib event she has been very fatigued and she has lots of shortness of breath.  Her chronic cough still waxes and wanes throughout the day.  She does continue to use anti-inflammatory agents for airway and aggressive therapy directed against reflux to handle her cough..  She has had evaluation for her post atrial fib shortness of breath including echocardiograms and x-rays and what sounds like blood tests all of which suggest that she is not in congestive heart failure.  Allergies as of 07/18/2021   No Known Allergies      Medication List    albuterol 108 (90 Base) MCG/ACT inhaler Commonly known as: Proventil HFA Inhale two puffs every four to six hours as needed for cough or wheeze.   amiodarone 200 MG tablet Commonly known as: PACERONE Take 1 tablet (200 mg total) by mouth daily.   apixaban 5 MG Tabs tablet Commonly known as: ELIQUIS Take 1 tablet (5 mg total) by mouth in the morning and at bedtime.   Asmanex HFA 200 MCG/ACT Aero Generic drug: Mometasone Furoate INHALE TWO DOSES TWICE DAILY TO PREVENT COUGH OR WHEEZE. INHALE THREE DOSES THREE TIMES DAILY DURING FLARE-UP. RINSE, GARGLE, AND SPIT AFTER USE.   BIOTIN FORTE PO Take 1 tablet by mouth daily.   budesonide 32 MCG/ACT nasal  spray Commonly known as: RHINOCORT AQUA Place 1 spray into both nostrils daily.   calcium-vitamin D 500-200 MG-UNIT tablet Commonly known as: OSCAL WITH D Take 1 tablet by mouth daily.   Dexilant 60 MG capsule Generic drug: dexlansoprazole Take 60 mg by mouth in the morning.   dextromethorphan-guaiFENesin 30-600 MG 12hr tablet Commonly known as: MUCINEX DM Take 1 tablet by mouth 2 (two) times daily as needed for cough.   diltiazem 120 MG 24 hr capsule Commonly known as: Cardizem CD Take 1 capsule (120 mg total) by mouth daily.   furosemide 20 MG tablet Commonly known as: LASIX Take 20 mg by mouth 3 (three) times a week.   Melatonin 10 MG Caps Take 10 mg by mouth at bedtime.   MULTI-VITAMIN DAILY PO Take 1 tablet by mouth daily.   potassium chloride 10 MEQ tablet Commonly known as: KLOR-CON Take on Monday, Wednesday and Friday.   telmisartan 40 MG tablet Commonly known as: MICARDIS Take 1 tablet (40 mg total) by mouth daily.    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   HENT: Negative.    Eyes: Negative.   Respiratory: Negative.    Cardiovascular: Negative.   Gastrointestinal: Negative.   Genitourinary: Negative.   Musculoskeletal: Negative.   Skin: Negative.   Neurological: Negative.   Endo/Heme/Allergies: Negative.   Psychiatric/Behavioral: Negative.      Objective:   Vitals:   07/18/21 1051 07/18/21 1052  BP:    Pulse:    Resp:    Temp:    SpO2: (!) 85% (!) 88%   Height: 5\' 2"  (157.5 cm)  Weight: 168 lb (76.2 kg)   Physical Exam Constitutional:      Appearance: She is not diaphoretic.  HENT:     Head: Normocephalic.     Right Ear: Tympanic membrane, ear canal and external ear normal.     Left Ear: Tympanic membrane, ear canal and external ear normal.     Nose: Nose normal. No mucosal edema or rhinorrhea.     Mouth/Throat:     Pharynx: Uvula midline. No oropharyngeal exudate.  Eyes:     Conjunctiva/sclera: Conjunctivae normal.  Neck:     Thyroid: No thyromegaly.     Trachea: Trachea normal. No tracheal tenderness or tracheal deviation.  Cardiovascular:     Rate and Rhythm: Normal rate and regular rhythm.     Heart sounds: Normal heart sounds, S1 normal and S2 normal. No murmur heard. Pulmonary:     Effort: No respiratory distress.     Breath sounds: Normal breath sounds. No stridor. No wheezing or rales.  Lymphadenopathy:     Head:     Right side of head: No tonsillar adenopathy.     Left side of head: No tonsillar adenopathy.     Cervical: No cervical adenopathy.  Skin:    Findings: No erythema or rash.     Nails: There is no clubbing.  Neurological:     Mental Status: She is alert.    Diagnostics:   Oxygen saturation at rest on room air was 91%.  Oxygen saturation while walking the hallway on room air was 85%.  Oxygen saturation while walking the hallway on 2 L nasal cannula was 88%.   Oxygen saturation at rest on 2 L nasal cannula was 97%.  Results of an echocardiogram obtained 11 March 2021 identifies the following:   1. Left ventricular ejection fraction, by estimation, is 60 to 65%. The  left ventricle has normal function. The left ventricle has no regional  wall motion abnormalities. Left ventricular diastolic parameters are  consistent with Grade III diastolic  dysfunction (restrictive). The average left ventricular global  longitudinal strain is -11.2 %. The global longitudinal strain is  abnormal.   2. Right ventricular systolic function is normal. The right ventricular  size is normal. There is normal pulmonary artery systolic pressure.   3. Left atrial size was mildly dilated.  4. The mitral valve is normal in structure. Mild mitral valve  regurgitation. No evidence of mitral stenosis.   5. The aortic valve is normal in structure. Aortic valve regurgitation is  not visualized. Mild aortic valve sclerosis is present, with no evidence  of aortic valve stenosis.   6. The inferior vena cava is normal in size with greater than 50%  respiratory variability, suggesting right atrial pressure of 3 mmHg.   Results of a chest x-ray obtained 03 July 2021 identifies no significant abnormality with an unremarkable cardiac silhouette and no airspace disease, effusion, pneumothorax.  She had moderate hiatal hernia.  Results of blood tests obtained 08 February 2021 identifies WBC 10.5, absolute eosinophil 100, absolute lymphocyte 1800, hemoglobin 13.3, platelet 349.  Results of blood test obtained 17 June 2021 identifies proBNP 499 PG/mL  Assessment and Plan:   1. Asthma, well controlled, mild persistent   2. Other allergic rhinitis   3. LPRD (laryngopharyngeal reflux disease)   4. Hypoxemia      1. Continue to Treat reflux:   A.  Minimize caffeine and chocolate consumption    B.  Dexilant 60 mg 1 time a day in a.m.   2. Continue to Treat  inflammation:   A.  Asmanex 200 HFA - 2 inhalations 2 time per day (empty lungs)  B. OTC Rhinocort - one spray each nostril once a day  3. If needed:   A. Proventil HFA 2 puffs every 4-6 hours  B. OTC antihistamine  C. OTC Mucinex DM  D. Nasal saline  E. OTC Pataday-1 drop each eye 1 time per day  4.  Start oxygen at 2 L nasal cannula during exertion and while sleeping  5.  Reevaluation with cardiology as soon as possible  6.  Evaluation with pulmonology as soon as possible  5.  Return to clinic in 4 weeks  Izora Gala cannot oxygenate correctly for some reason when she exerts herself.  We are going to provide her oxygen during exertion and while she sleeps and have her reevaluated with cardiology and pulmonology as soon as possible.  This hypoxemia does not appear to be an issue with asthma and she is going to remain on anti-inflammatory agents for her airway and she will continue to also address the issue with her reflux induced respiratory disease as noted above.   Allena Katz, MD [facility]

## 2021-07-18 NOTE — Patient Instructions (Addendum)
°  1. Continue to Treat reflux:   A.  Minimize caffeine and chocolate consumption    B.  Dexilant 60 mg 1 time a day in a.m.   2. Continue to Treat inflammation:   A.  Asmanex 200 HFA - 2 inhalations 2 time per day (empty lungs)  B. OTC Rhinocort - one spray each nostril once a day  3. If needed:   A. Proventil HFA 2 puffs every 4-6 hours  B. OTC antihistamine  C. OTC Mucinex DM  D. Nasal saline  E. OTC Pataday-1 drop each eye 1 time per day  4.  Start oxygen at 2 L nasal cannula during exertion and while sleeping  5.  Reevaluation with cardiology as soon as possible  6.  Evaluation with pulmonology as soon as possible  5.  Return to clinic in 4 weeks

## 2021-07-19 ENCOUNTER — Encounter (HOSPITAL_BASED_OUTPATIENT_CLINIC_OR_DEPARTMENT_OTHER): Payer: Self-pay | Admitting: Emergency Medicine

## 2021-07-19 ENCOUNTER — Emergency Department (HOSPITAL_BASED_OUTPATIENT_CLINIC_OR_DEPARTMENT_OTHER): Payer: Medicare PPO

## 2021-07-19 ENCOUNTER — Encounter: Payer: Self-pay | Admitting: Cardiology

## 2021-07-19 ENCOUNTER — Telehealth: Payer: Self-pay

## 2021-07-19 ENCOUNTER — Other Ambulatory Visit: Payer: Self-pay

## 2021-07-19 ENCOUNTER — Emergency Department (HOSPITAL_BASED_OUTPATIENT_CLINIC_OR_DEPARTMENT_OTHER)
Admission: EM | Admit: 2021-07-19 | Discharge: 2021-07-19 | Disposition: A | Discharge status: Home / Self Care | Payer: Medicare PPO | Attending: Emergency Medicine | Admitting: Emergency Medicine

## 2021-07-19 DIAGNOSIS — I10 Essential (primary) hypertension: Secondary | ICD-10-CM | POA: Insufficient documentation

## 2021-07-19 DIAGNOSIS — J45909 Unspecified asthma, uncomplicated: Secondary | ICD-10-CM | POA: Diagnosis not present

## 2021-07-19 DIAGNOSIS — Z79899 Other long term (current) drug therapy: Secondary | ICD-10-CM | POA: Diagnosis not present

## 2021-07-19 DIAGNOSIS — Z20822 Contact with and (suspected) exposure to covid-19: Secondary | ICD-10-CM | POA: Insufficient documentation

## 2021-07-19 DIAGNOSIS — R0602 Shortness of breath: Secondary | ICD-10-CM | POA: Insufficient documentation

## 2021-07-19 DIAGNOSIS — R0609 Other forms of dyspnea: Secondary | ICD-10-CM

## 2021-07-19 LAB — CBC WITH DIFFERENTIAL/PLATELET
Abs Immature Granulocytes: 0.03 10*3/uL (ref 0.00–0.07)
Basophils Absolute: 0.1 10*3/uL (ref 0.0–0.1)
Basophils Relative: 1 %
Eosinophils Absolute: 0.1 10*3/uL (ref 0.0–0.5)
Eosinophils Relative: 1 %
HCT: 34.3 % — ABNORMAL LOW (ref 36.0–46.0)
Hemoglobin: 11.1 g/dL — ABNORMAL LOW (ref 12.0–15.0)
Immature Granulocytes: 0 %
Lymphocytes Relative: 7 %
Lymphs Abs: 0.7 10*3/uL (ref 0.7–4.0)
MCH: 27.6 pg (ref 26.0–34.0)
MCHC: 32.4 g/dL (ref 30.0–36.0)
MCV: 85.3 fL (ref 80.0–100.0)
Monocytes Absolute: 0.7 10*3/uL (ref 0.1–1.0)
Monocytes Relative: 7 %
Neutro Abs: 7.5 10*3/uL (ref 1.7–7.7)
Neutrophils Relative %: 84 %
Platelets: 366 10*3/uL (ref 150–400)
RBC: 4.02 MIL/uL (ref 3.87–5.11)
RDW: 13.8 % (ref 11.5–15.5)
WBC: 9 10*3/uL (ref 4.0–10.5)
nRBC: 0 % (ref 0.0–0.2)

## 2021-07-19 LAB — BASIC METABOLIC PANEL
Anion gap: 7 (ref 5–15)
BUN: 18 mg/dL (ref 8–23)
CO2: 26 mmol/L (ref 22–32)
Calcium: 8.7 mg/dL — ABNORMAL LOW (ref 8.9–10.3)
Chloride: 101 mmol/L (ref 98–111)
Creatinine, Ser: 0.92 mg/dL (ref 0.44–1.00)
GFR, Estimated: >60 mL/min (ref >60)
Glucose, Bld: 110 mg/dL — ABNORMAL HIGH (ref 70–99)
Potassium: 3.7 mmol/L (ref 3.5–5.1)
Sodium: 134 mmol/L — ABNORMAL LOW (ref 135–145)

## 2021-07-19 LAB — RESP PANEL BY RT-PCR (FLU A&B, COVID) ARPGX2
Influenza A by PCR: NEGATIVE
Influenza B by PCR: NEGATIVE
SARS Coronavirus 2 by RT PCR: NEGATIVE

## 2021-07-19 LAB — BRAIN NATRIURETIC PEPTIDE: B Natriuretic Peptide: 187.4 pg/mL — ABNORMAL HIGH (ref 0.0–100.0)

## 2021-07-19 NOTE — Discharge Instructions (Addendum)
You were seen in the emergency department for evaluation of continued shortness of breath.  You had blood work EKG and a CAT scan of your chest that did not show an obvious explanation for your symptoms.  Please follow-up with your cardiologist and the pulmonologist as scheduled.  Return to the emergency department if any worsening or concerning symptoms.  Please double your Lasix (furosemide) for 1 week

## 2021-07-19 NOTE — ED Triage Notes (Signed)
Pt seen by allergist and was placed on o2.  Pt having sob.  Pt states her pox dropped with ambulation.  Pt has O2 at 2L.  Chronic cough, which seems to have worsened but improved with O2.  Pt sent here by Dr. Dulce Sellar for evaluation.

## 2021-07-19 NOTE — Telephone Encounter (Signed)
-----   Message from Baldo Daub, MD sent at 07/18/2021  9:03 PM EST ----- Regarding: FW: I saw this at 9pm I tried calling no answer went to voice mail I am concerned re amiodarone lung toxicity Stop amiodarone I want her to go to ED I [facility] in AM Needs CT chest quickly and urgent evaluation sat 85% today at Dr Lucie Leather I advise her strongly to go to Ed tomorrow and not to wait until next week ----- Message ----- From: Jessica Priest, MD Sent: 07/18/2021  12:23 PM EST To: Baldo Daub, MD  Hello Celene Skeen think [name] needs to see you soon. Dropped her oxygen down to 85% while walking the hallway.

## 2021-07-19 NOTE — Progress Notes (Signed)
I received the note from allergy and immunology last evening I called the patient's home went to voicemail I spoke to my staff this morning with recommendation to go to the ED she is on amiodarone and has unexplained shortness of breath and hypoxia as documented in the visit yesterday is at risk for amiodarone lung toxicity. When she was seen by EP at the end of November she was complaining of shortness of breath and her N-terminal proBNP was intermediate range. The differential diagnosis also includes heart failure. In particular I think she needs a noncontrast CT of the chest to look at her lung architecture on amiodarone and evaluation for other causes of shortness of breath with hypoxia including heart failure.

## 2021-07-19 NOTE — ED Provider Notes (Signed)
[facility] DEPART[facility] Provider Note   CSN: [id number] Arrival date & time: 07/19/21  1026     History Chief Complaint  Patient presents with   Shortness of Breath    Karen Snyder is a 80 y.o. female.  She has a history of A. fib and dyspnea.  She is on anticoagulation.  She is felt short of breath since her A. fib diagnosis this spring.  She has noticed it seems to be a little bit worse over the last month.  Positive dyspnea on exertion.  She saw her allergist yesterday who found her to be hypoxic and started her on 2 L nasal cannula.  Her cardiologist called her today and recommended she come to the emergency department for evaluation.  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 chronic cough.  Minimally productive.  No fevers chills nausea vomiting.  No hemoptysis.  The history is provided by the patient.  Shortness of Breath Severity:  Moderate Onset quality:  Gradual Duration:  1 month Timing:  Intermittent Progression:  Worsening Chronicity:  New Context: activity   Relieved by:  Nothing Worsened by:  Activity Ineffective treatments:  Rest and oxygen Associated symptoms: cough   Associated symptoms: no abdominal pain, no chest pain, no fever, no headaches, no hemoptysis, no rash, no sore throat and no vomiting   Risk factors: no tobacco use       Past Medical History:  Diagnosis Date   A-fib ([place])    Asthma    Blepharitis of upper and lower eyelids of both eyes 07/19/2019   Capsulitis of metatarsophalangeal (MTP) joint of left foot 07/01/2016   Dependent edema 06/20/2019   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/19/2019   Environmental and seasonal allergies 04/15/2018   Essential hypertension 04/15/2018   Exertional dyspnea 06/20/2019   GERD (gastroesophageal reflux disease)    GERD without esophagitis 04/15/2018   High blood pressure    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   Mild persistent asthma without complication  [id number]   Peripheral drusen, bilateral 07/19/2019   Postmenopausal bleeding 08/23/2015   Rhinosinusitis 01/11/2020   Screening for colon cancer 08/15/2019   Tachycardia 01/24/2021   TMJ tenderness, right 08/15/2019    Patient Active Problem List   Diagnosis Date Noted   Lumbar back pain 04/04/2021   Atrial fibrillation with RVR ([place]) 02/04/2021   Asthma 01/28/2021   GERD (gastroesophageal reflux disease) 01/28/2021   High blood pressure 01/28/2021   Tachycardia 01/24/2021   Rhinosinusitis 01/11/2020   TMJ tenderness, right 08/15/2019   Screening for colon cancer 08/15/2019   Blepharitis of upper and lower eyelids of both eyes 07/19/2019   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/19/2019   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     Family History  Problem Relation Age of Onset   Hypertension Mother    Atrial fibrillation Mother  pacemaker   Hypertension Father    Heart attack Father    Eczema Sister    Heart Problems Maternal Grandmother    Heart Problems Maternal Grandfather     Social History   Tobacco Use   Smoking status: Never   Smokeless tobacco: Never  Substance Use Topics   Alcohol use: No   Drug use: No    Home Medications Prior to Admission medications   Medication Sig Start Date End Date Taking? Authorizing Provider  albuterol  (PROVENTIL HFA) 108 (90 Base) MCG/ACT inhaler Inhale two puffs every four to six hours as needed for cough or wheeze. 07/28/16   Kozlow, Donnamarie Poag, MD  amiodarone (PACERONE) 200 MG tablet Take 1 tablet (200 mg total) by mouth daily. 05/09/21   Richardo Priest, MD  apixaban (ELIQUIS) 5 MG TABS tablet Take 1 tablet (5 mg total) by mouth in the morning and at bedtime. 02/20/21   Richardo Priest, MD  ASMANEX HFA 200 MCG/ACT AERO INHALE TWO DOSES TWICE DAILY TO PREVENT COUGH OR WHEEZE.  RINSE, GARGLE, AND SPIT AFTER USE. 07/18/21   Kozlow, Donnamarie Poag, MD  BIOTIN FORTE PO Take 1 tablet by mouth daily.    [provider]  budesonide (RHINOCORT AQUA) 32 MCG/ACT nasal spray Place 1 spray into both nostrils daily.    [provider]  calcium-vitamin D (OSCAL WITH D) 500-200 MG-UNIT tablet Take 1 tablet by mouth daily.    [provider]  DEXILANT 60 MG capsule Take 60 mg by mouth in the morning.  12/28/18   [provider]  dextromethorphan-guaiFENesin (MUCINEX DM) 30-600 MG 12hr tablet Take 1 tablet by mouth 2 (two) times daily as needed for cough.    [provider]  diltiazem (CARDIZEM CD) 120 MG 24 hr capsule Take 1 capsule (120 mg total) by mouth daily. 03/05/21   Richardo Priest, MD  furosemide (LASIX) 20 MG tablet Take 20 mg by mouth 3 (three) times a week.    [provider]  Melatonin 10 MG CAPS Take 10 mg by mouth at bedtime.    [provider]  Multiple Vitamin (MULTI-VITAMIN DAILY PO) Take 1 tablet by mouth daily.    [provider]  potassium chloride (KLOR-CON) 10 MEQ tablet Take on Monday, Wednesday and Friday. 01/29/21   Tobb, Kardie, DO  telmisartan (MICARDIS) 40 MG tablet Take 1 tablet (40 mg total) by mouth daily. 05/09/21   Richardo Priest, MD    Allergies    Patient has no known allergies.  Review of Systems   Review of Systems  Constitutional:  Negative for fever.  HENT:  Negative for sore throat.   Eyes:  Negative for  visual disturbance.  Respiratory:  Positive for cough and shortness of breath. Negative for hemoptysis.   Cardiovascular:  Negative for chest pain.  Gastrointestinal:  Negative for abdominal pain and vomiting.  Genitourinary:  Negative for dysuria.  Musculoskeletal:  Negative for joint swelling.  Skin:  Negative for rash.  Neurological:  Negative for headaches.   Physical Exam Updated Vital Signs BP (!) 147/67 (BP Location: Left Arm)    Pulse 72    Temp 97.9 F (36.6 C) (Oral)    Resp (!) 22    Ht 5\' 2"  (1.575 m)    Wt 76.2 kg    SpO2 100%    BMI 30.73 kg/m   Physical Exam Vitals and nursing note reviewed.  Constitutional:      General: She is  not in acute distress.    Appearance: She is well-developed.  HENT:     Head: Normocephalic and atraumatic.  Eyes:     Conjunctiva/sclera: Conjunctivae normal.  Cardiovascular:     Rate and Rhythm: Normal rate and regular rhythm.     Heart sounds: No murmur heard. Pulmonary:     Effort: Pulmonary effort is normal. Tachypnea present. No respiratory distress.     Breath sounds: Normal breath sounds.  Abdominal:     Palpations: Abdomen is soft.     Tenderness: There is no abdominal tenderness.  Musculoskeletal:        General: No swelling.     Cervical back: Neck supple.     Right lower leg: No tenderness. Edema present.     Left lower leg: No tenderness. Edema present.  Skin:    General: Skin is warm and dry.     Capillary Refill: Capillary refill takes less than 2 seconds.  Neurological:     General: No focal deficit present.     Mental Status: She is alert.  Psychiatric:        Mood and Affect: Mood normal.    ED Results / Procedures / Treatments   Labs (all labs ordered are listed, but only abnormal results are displayed) Labs Reviewed  BASIC METABOLIC PANEL - Abnormal; Notable for the following components:      Result Value   Sodium 134 (*)    Glucose, Bld 110 (*)    Calcium 8.7 (*)    All other components within  normal limits  BRAIN NATRIURETIC PEPTIDE - Abnormal; Notable for the following components:   B Natriuretic Peptide 187.4 (*)    All other components within normal limits  CBC WITH DIFFERENTIAL/PLATELET - Abnormal; Notable for the following components:   Hemoglobin 11.1 (*)    HCT 34.3 (*)    All other components within normal limits  RESP PANEL BY RT-PCR (FLU A&B, COVID) [id number]    EKG EKG Interpretation  Date/Time:  Friday July 19 2021 11:24:29 EST Ventricular Rate:  69 PR Interval:  168 QRS Duration: 86 QT Interval:  437 QTC Calculation: 469 R Axis:   125 Text Interpretation: Right and left arm electrode reversal, interpretation assumes no reversal Sinus rhythm Right axis deviation No significant change since prior 7/22 Confirmed by Meridee Score [phone]) on 07/19/2021 11:25:36 AM  Radiology CT Chest High Resolution  Result Date: 07/19/2021 CLINICAL DATA:  Dyspnea, on oxygen therapy. Chronic cough. 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 EXAM: CT CHEST WITHOUT CONTRAST TECHNIQUE: Multidetector CT imaging of the chest was performed following the standard protocol without intravenous contrast. High resolution imaging of the lungs, as well as inspiratory and expiratory imaging, was performed. COMPARISON:  01/19/2021 chest radiograph. FINDINGS: Cardiovascular: Top-normal heart size. No significant pericardial effusion/thickening. Left anterior descending and left circumflex coronary atherosclerosis. Atherosclerotic nonaneurysmal thoracic aorta. Normal caliber pulmonary arteries. Mediastinum/Nodes: No discrete thyroid nodules. Unremarkable esophagus. No axillary adenopathy. Mildly enlarged 1.1 cm subcarinal node (series 4/image 66). No discrete hilar adenopathy on these noncontrast images. Lungs/Pleura: No pneumothorax. Small dependent bilateral pleural effusions, right greater than left. Solid anterior right upper lobe 0.4 cm pulmonary nodule (series 6/image 39). No lung masses or  additional significant pulmonary nodules. Mild patchy air trapping in both lungs on the expiration sequence, without evidence of tracheobronchomalacia. Mild platelike consolidation in the dependent and basilar lower lobes bilaterally, right greater than left, with some associated volume loss, favoring passive atelectasis. Patchy subpleural reticulation and ground-glass opacity in  the bilateral lower lobes and scattered mild interlobular septal thickening in both lungs. No significant regions of traction bronchiectasis, architectural distortion or frank honeycombing. Upper abdomen: Large hiatal hernia. Musculoskeletal: No aggressive appearing focal osseous lesions. Marked lower thoracic degenerative disc disease. IMPRESSION: 1. Small dependent bilateral pleural effusions, right greater than left, with associated mild passive atelectasis in the lower lobes bilaterally, significantly limiting evaluation for an underlying interstitial lung disease. 2. Patchy subpleural reticulation and ground-glass opacity in the bilateral lower lobes. No significant regions of traction bronchiectasis or frank honeycombing. Findings are nonspecific and could be due to hypoventilatory change, with a mild interstitial lung disease such as nonspecific interstitial pneumonia (NSIP) or early usual interstitial pneumonia (UIP) not excluded. Consider follow-up outpatient high-resolution chest CT study in 3-6 months to assess temporal pattern stability, as clinically warranted. 3. Solid 0.4 cm right upper lobe pulmonary nodule. No follow-up needed if patient is low-risk. Non-contrast chest CT can be considered in 12 months if patient is high-risk. This recommendation follows the consensus statement: Guidelines for Management of Incidental Pulmonary Nodules Detected on CT Images: From the Fleischner Society 2017; Radiology 2017; 284:228-243. 4. Mild patchy air trapping in both lungs, indicative of small airways disease. 5. Two-vessel coronary  atherosclerosis. 6. Large hiatal hernia. 7. Mild mediastinal lymphadenopathy, nonspecific, potentially reactive. 8. Aortic Atherosclerosis (ICD10-I70.0). Electronically Signed   By: Ilona Sorrel M.D.   On: 07/19/2021 12:12    Procedures Procedures   Medications Ordered in ED Medications - No data to display  ED Course  I have reviewed the triage vital signs and the nursing notes.  Pertinent labs & imaging results that were available during my care of the patient were reviewed by me and considered in my medical decision making (see chart for details).  Clinical Course as of 07/19/21 1729  Fri Jul 19, 2021  1232 Tried to reach patient's cardiologist Dr. Bettina Gavia who sent the patient in.  Unfortunately he is on vacation this week.  Dr. Geraldo Pitter is supposed to call me back when he is available [MB]  1312 discussed with Dr. Tamala Julian from pulmonology.  He reviewed her CT and felt she was appropriate for outpatient follow-up.  Did recommend doubling her diuretics for a week. [MB]  [id number] reviewed results of testing and pulmonary recommendations with patient and husband.  She is comfortable plan for discharge and outpatient follow-up. [MB]    Clinical Course User Index [MB] Hayden Rasmussen, MD   MDM Rules/Calculators/A&P                         This patient complains of chronic cough increased shortness of breath dyspnea on exertion; this involves an extensive number of treatment Options and is a complaint that carries with it a high risk of complications and Morbidity. The differential includes COPD, CHF, pneumonia, COVID, flu, interstitial lung disease  I ordered, reviewed and interpreted labs, which included CBC with normal white count, hemoglobin slightly lower than baseline, chemistries fairly unremarkable other than mildly low sodium elevated glucose, BMP mildly elevated at 187 lower than priors, COVID and flu negative I ordered imaging studies which included CT chest high-resolution and I  independently    visualized and interpreted imaging which showed bilateral effusions, some areas of groundglass opacities, pulmonary nodule that needs follow-up Additional history obtained from patient's husband Previous records obtained and reviewed in epic including prior allergist and cardiology notes I consulted cardiology Dr. Geraldo Pitter and pulmonologist Dr. Tamala Julian and discussed lab  and imaging findings  Critical Interventions: None  After the interventions stated above, I reevaluated the patient and found patient to be stable on her 2 L nasal cannula.  She feels comfortable plan for discharge and outpatient follow-up with her providers.  Return instructions discussed.  Recommendation from Dr. Tamala Julian to increase her diuretics.  This was reviewed with patient by the nurse.     Final Clinical Impression(s) / ED Diagnoses Final diagnoses:  Dyspnea on exertion    Rx / DC Orders ED Discharge Orders     None        Hayden Rasmussen, MD 07/19/21 [phone]

## 2021-07-19 NOTE — ED Notes (Signed)
On r/a after changing into gown, SpO2 87%, HR 70, +DOE. Placed back on 2lpm as at home. SpO2 99%.   BBS with fine crackles bilat lower lobes.

## 2021-07-19 NOTE — Progress Notes (Signed)
Case and CT reviewed. Maybe early NSIP?  Maybe volume?  Amio lung toxicity possible but not classic appearance. Minimal O2 need. Possibly amio toxicity vs. Residual volume overload.  Her hiatal hernia is impressive, would not be surprised if this could be contributing. Long story short, he has had this issue for months, prior CXR also abnormal.  Would continue supplemental O2 and has formal pulmonary consult with Dr. Judeth Horn on 07/30/21.  I do not think this needs inpatient workup at present.  Myrla Halsted MD PCCM

## 2021-07-19 NOTE — Telephone Encounter (Signed)
Dr. Charm Barges with [facility] needs to speak with you about this pt. Please call [phone].

## 2021-07-19 NOTE — Telephone Encounter (Signed)
Recommendations reviewed with pt as per Dr. Munley's note.  Pt verbalized understanding and had no additional questions.  

## 2021-07-22 ENCOUNTER — Ambulatory Visit: Payer: Medicare PPO | Admitting: Allergy and Immunology

## 2021-07-24 ENCOUNTER — Telehealth: Payer: Self-pay

## 2021-07-24 NOTE — Telephone Encounter (Signed)
She has appointment with Dr. Dulce Sellar on January 20th and pulmonology appointment on January 10th.

## 2021-07-24 NOTE — Telephone Encounter (Signed)
She needs to follow-up with Dr. Dulce Sellar ASAP and keep her appointment with pulmonology.

## 2021-07-24 NOTE — Telephone Encounter (Signed)
Patient called and wanted to make sure that Dr. Lucie Leather knew about her recent visit to the hospital and the results from her CT scan.  I informed patient that Dr. Lucie Leather had seen her notes but wanted to know if she had stopped her amiodarone.  Per [name], she did not stop amiodarone.  The hospital doctors just had her use her Lasix and Potassium every day for one week and then resume her Monday, Wednesday, Friday dosing schedule for those medications after that.

## 2021-07-30 ENCOUNTER — Other Ambulatory Visit: Payer: Self-pay

## 2021-07-30 ENCOUNTER — Encounter: Payer: Self-pay | Admitting: Pulmonary Disease

## 2021-07-30 ENCOUNTER — Ambulatory Visit (INDEPENDENT_AMBULATORY_CARE_PROVIDER_SITE_OTHER): Payer: Medicare PPO

## 2021-07-30 ENCOUNTER — Ambulatory Visit: Payer: Medicare PPO | Admitting: Pulmonary Disease

## 2021-07-30 VITALS — BP 136/64 | HR 80 | Temp 98.5°F | Ht 62.0 in | Wt 168.2 lb

## 2021-07-30 DIAGNOSIS — J9601 Acute respiratory failure with hypoxia: Secondary | ICD-10-CM | POA: Diagnosis not present

## 2021-07-30 DIAGNOSIS — E8770 Fluid overload, unspecified: Secondary | ICD-10-CM

## 2021-07-30 LAB — BASIC METABOLIC PANEL
BUN: 17 mg/dL (ref 6–23)
CO2: 29 mEq/L (ref 19–32)
Calcium: 9.3 mg/dL (ref 8.4–10.5)
Chloride: 98 mEq/L (ref 96–112)
Creatinine, Ser: 0.92 mg/dL (ref 0.40–1.20)
GFR: 58.54 mL/min — ABNORMAL LOW (ref >60.00)
Glucose, Bld: 98 mg/dL (ref 70–99)
Potassium: 3.7 mEq/L (ref 3.5–5.1)
Sodium: 133 mEq/L — ABNORMAL LOW (ref 135–145)

## 2021-07-30 MED ORDER — FUROSEMIDE 20 MG PO TABS: 20.0000 mg | ORAL_TABLET | Freq: Every day | ORAL | 0 refills | Status: DC

## 2021-07-30 NOTE — Addendum Note (Signed)
Addended byVilma Meckel on: 07/30/2021 01:26 PM   Modules accepted: Orders

## 2021-07-30 NOTE — Progress Notes (Signed)
@Patient  ID: Karen Snyder, female    DOB: 02/10/1941, 81 y.o.   MRN: [id number]  Chief Complaint  Patient presents with   Consult    Consult for hypoxemia and asthma. When she was walking with her docotr in decemebr her oxygen started dropping and now she is on 2L of oxygen     Referring provider: Jiles Prows, MD  HPI:   81 y.o. woman whom we are seeing in consultation for evaluation of acute hypoxemic respiratory failure.  Most recent cardiology note reviewed.  Note from referring provider reviewed.  Patient with few months of gradual dyspnea.  Issue with chronic cough for decades.  Was seen in her allergist and noted to be incidentally hypoxemic.  Oxygen was ordered for her.  Work-up included CT high-res on my review interpretation shows bilateral pleural effusions with streaky opacity in the bases favored to represent atelectasis.  Read concern for possible ILD.  Her diuretic was increased from Lasix 20 mg every Monday Wednesday Friday to 20 mg daily.  She reports 8 pound weight loss in that week.  Her dyspnea has improved.  She walked in the clinic today and oxygen saturation 96% on room air.  She does at home without oxygen over the last few days her oxygen saturation has not dropped below 88%.  We discussed at length her imaging findings which to me is most consistent with volume overload.  Improvement in symptoms and hypoxemia with diuresis fits with this.  Fibrosis from any kind will not improve with diuresis alone.  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, seasonal allergies, asthma Surgical history: Adenoidectomy/tonsillectomy, hernia repair Family history: Mother with hypertension, atrial fibrillation, father with hypertension, CAD Social history: Never smoker, lives in [place] / Pulmonary Flowsheets:   ACT:  Asthma Control Test ACT Total Score  07/15/2017 23  02/18/2017 24    MMRC: No flowsheet data found.  Epworth:  No  flowsheet data found.  Tests:   FENO:  No results found for: NITRICOXIDE  PFT: No flowsheet data found.  WALK:  No flowsheet data found.  Imaging: Personally reviewed and as per EMR discussion this note CT Chest High Resolution  Result Date: 07/19/2021 CLINICAL DATA:  Dyspnea, on oxygen therapy. Chronic cough. 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 EXAM: CT CHEST WITHOUT CONTRAST TECHNIQUE: Multidetector CT imaging of the chest was performed following the standard protocol without intravenous contrast. High resolution imaging of the lungs, as well as inspiratory and expiratory imaging, was performed. COMPARISON:  01/19/2021 chest radiograph. FINDINGS: Cardiovascular: Top-normal heart size. No significant pericardial effusion/thickening. Left anterior descending and left circumflex coronary atherosclerosis. Atherosclerotic nonaneurysmal thoracic aorta. Normal caliber pulmonary arteries. Mediastinum/Nodes: No discrete thyroid nodules. Unremarkable esophagus. No axillary adenopathy. Mildly enlarged 1.1 cm subcarinal node (series 4/image 66). No discrete hilar adenopathy on these noncontrast images. Lungs/Pleura: No pneumothorax. Small dependent bilateral pleural effusions, right greater than left. Solid anterior right upper lobe 0.4 cm pulmonary nodule (series 6/image 39). No lung masses or additional significant pulmonary nodules. Mild patchy air trapping in both lungs on the expiration sequence, without evidence of tracheobronchomalacia. Mild platelike consolidation in the dependent and basilar lower lobes bilaterally, right greater than left, with some associated volume loss, favoring passive atelectasis. Patchy subpleural reticulation and ground-glass opacity in the bilateral lower lobes and scattered mild interlobular septal thickening in both lungs. No significant regions of traction bronchiectasis, architectural distortion or frank honeycombing. Upper abdomen: Large hiatal hernia.  Musculoskeletal: No aggressive appearing focal  osseous lesions. Marked lower thoracic degenerative disc disease. IMPRESSION: 1. Small dependent bilateral pleural effusions, right greater than left, with associated mild passive atelectasis in the lower lobes bilaterally, significantly limiting evaluation for an underlying interstitial lung disease. 2. Patchy subpleural reticulation and ground-glass opacity in the bilateral lower lobes. No significant regions of traction bronchiectasis or frank honeycombing. Findings are nonspecific and could be due to hypoventilatory change, with a mild interstitial lung disease such as nonspecific interstitial pneumonia (NSIP) or early usual interstitial pneumonia (UIP) not excluded. Consider follow-up outpatient high-resolution chest CT study in 3-6 months to assess temporal pattern stability, as clinically warranted. 3. Solid 0.4 cm right upper lobe pulmonary nodule. No follow-up needed if patient is low-risk. Non-contrast chest CT can be considered in 12 months if patient is high-risk. This recommendation follows the consensus statement: Guidelines for Management of Incidental Pulmonary Nodules Detected on CT Images: From the Fleischner Society 2017; Radiology 2017; 284:228-243. 4. Mild patchy air trapping in both lungs, indicative of small airways disease. 5. Two-vessel coronary atherosclerosis. 6. Large hiatal hernia. 7. Mild mediastinal lymphadenopathy, nonspecific, potentially reactive. 8. Aortic Atherosclerosis (ICD10-I70.0). Electronically Signed   By: Ilona Sorrel M.D.   On: 07/19/2021 12:12    Lab Results: Personally reviewed CBC    Component Value Date/Time   WBC 9.0 07/19/2021 1115   RBC 4.02 07/19/2021 1115   HGB 11.1 (L) 07/19/2021 1115   HCT 34.3 (L) 07/19/2021 1115   PLT 366 07/19/2021 1115   MCV 85.3 07/19/2021 1115   MCH 27.6 07/19/2021 1115   MCHC 32.4 07/19/2021 1115   RDW 13.8 07/19/2021 1115   LYMPHSABS 0.7 07/19/2021 1115   MONOABS 0.7  07/19/2021 1115   EOSABS 0.1 07/19/2021 1115   BASOSABS 0.1 07/19/2021 1115    BMET    Component Value Date/Time   NA 134 (L) 07/19/2021 1115   NA 135 05/09/2021 1431   K 3.7 07/19/2021 1115   CL 101 07/19/2021 1115   CO2 26 07/19/2021 1115   GLUCOSE 110 (H) 07/19/2021 1115   BUN 18 07/19/2021 1115   BUN 17 05/09/2021 1431   CREATININE 0.92 07/19/2021 1115   CALCIUM 8.7 (L) 07/19/2021 1115   GFRNONAA >60 07/19/2021 1115    BNP    Component Value Date/Time   BNP 187.4 (H) 07/19/2021 1115    ProBNP    Component Value Date/Time   PROBNP 499 06/17/2021 1105    Specialty Problems       Pulmonary Problems   Mild persistent asthma without complication   Bronchitis   Exertional dyspnea   Rhinosinusitis   Asthma    No Known Allergies  Immunization History  Administered Date(s) Administered   Fluad Quad(high Dose 65+) 05/09/2021   Influenza, High Dose Seasonal PF 04/29/2017, 04/15/2018, 04/25/2019, 05/11/2020   Influenza-Unspecified 05/22/2006, 06/09/2007, 04/15/2008   Pneumococcal Conjugate-13 01/16/2018   Pneumococcal Polysaccharide-23 04/25/2019   Tdap 02/16/2007    Past Medical History:  Diagnosis Date   A-fib ([place])    Asthma    Blepharitis of upper and lower eyelids of both eyes 07/19/2019   Capsulitis of metatarsophalangeal (MTP) joint of left foot 07/01/2016   Dependent edema 06/20/2019   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/19/2019   Environmental and seasonal allergies 04/15/2018   Essential hypertension 04/15/2018   Exertional dyspnea 06/20/2019   GERD (gastroesophageal reflux disease)    GERD without esophagitis 04/15/2018   High blood pressure    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  Mild persistent asthma without complication [id number]   Peripheral drusen, bilateral 07/19/2019   Postmenopausal bleeding 08/23/2015   Rhinosinusitis 01/11/2020   Screening for colon cancer 08/15/2019   Tachycardia 01/24/2021    TMJ tenderness, right 08/15/2019    Tobacco History: Social History   Tobacco Use  Smoking Status Never  Smokeless Tobacco Never   Counseling given: Not Answered   Continue to not smoke  Outpatient Encounter Medications as of 07/30/2021  Medication Sig   albuterol (PROVENTIL HFA) 108 (90 Base) MCG/ACT inhaler Inhale two puffs every four to six hours as needed for cough or wheeze.   amiodarone (PACERONE) 200 MG tablet Take 1 tablet (200 mg total) by mouth daily.   apixaban (ELIQUIS) 5 MG TABS tablet Take 1 tablet (5 mg total) by mouth in the morning and at bedtime.   ASMANEX HFA 200 MCG/ACT AERO INHALE TWO DOSES TWICE DAILY TO PREVENT COUGH OR WHEEZE.  RINSE, GARGLE, AND SPIT AFTER USE.   BIOTIN FORTE PO Take 1 tablet by mouth daily.   budesonide (RHINOCORT AQUA) 32 MCG/ACT nasal spray Place 1 spray into both nostrils daily.   calcium-vitamin D (OSCAL WITH D) 500-200 MG-UNIT tablet Take 1 tablet by mouth daily.   DEXILANT 60 MG capsule Take 60 mg by mouth in the morning.    dextromethorphan-guaiFENesin (MUCINEX DM) 30-600 MG 12hr tablet Take 1 tablet by mouth 2 (two) times daily as needed for cough.   diltiazem (CARDIZEM CD) 120 MG 24 hr capsule Take 1 capsule (120 mg total) by mouth daily.   furosemide (LASIX) 20 MG tablet Take 20 mg by mouth 3 (three) times a week.   Melatonin 10 MG CAPS Take 10 mg by mouth at bedtime.   Multiple Vitamin (MULTI-VITAMIN DAILY PO) Take 1 tablet by mouth daily.   potassium chloride (KLOR-CON) 10 MEQ tablet Take on Monday, Wednesday and Friday.   telmisartan (MICARDIS) 40 MG tablet Take 1 tablet (40 mg total) by mouth daily.   No facility-administered encounter medications on file as of 07/30/2021.     Review of Systems  Review of Systems  No chest pain with exertion.  No orthopnea or PND.  Comprehensive review of systems otherwise negative. Physical Exam  BP 136/64 (BP Location: Left Arm, Patient Position: Sitting, Cuff Size: Normal)    Pulse  80    Temp 98.5 F (36.9 C) (Oral)    Ht 5\' 2"  (1.575 m)    Wt 168 lb 3.2 oz (76.3 kg)    SpO2 96%    BMI 30.76 kg/m   Wt Readings from Last 5 Encounters:  07/30/21 168 lb 3.2 oz (76.3 kg)  07/19/21 168 lb (76.2 kg)  07/18/21 168 lb (76.2 kg)  06/17/21 168 lb 12.8 oz (76.6 kg)  05/09/21 164 lb (74.4 kg)    BMI Readings from Last 5 Encounters:  07/30/21 30.76 kg/m  07/19/21 30.73 kg/m  07/18/21 30.73 kg/m  06/17/21 30.87 kg/m  05/09/21 30.99 kg/m     Physical Exam General: well appearing, in NAD Eyes: EOMI, no icterus Neck: supple, no JVP Pulm: Absent L base, diminished R base, otherwise clear CV: Irregular, no murmur Abd: ND, BS present MSK: No synovitis, No joint effusion Neuro: Normal gait, no weakness Psych: Normal mood, full affect  Assessment & Plan:   Acute hypoxemic respiratory failure: Noted by allergist 06/2021 in the office.  She has chronic pleural effusions dating back to at least 01/2021.  CT high-res 07/19/2021 consistent with volume overload.  See no  definitive interstitial lung disease, impossible to interpret with volume overload present regardless.  She walked in the clinic today without oxygen and did not desaturate.  Her dyspnea has improved after increasing Lasix to daily from Monday Wednesday Friday.  Correlation of imaging findings as well as symptomatic improvement with Lasix consistent with volume overload as etiology of respiratory failure.  Likely exacerbated in the setting of her atrial arrhythmia.  Instructed her to check oxygen saturations at rest and with ambulation and to ensure its above 88%.  If it stays above 88% she can not use her oxygen.  We will obtain labs today, BNP given recent increase in Lasix.  As such requires intensive drug monitoring.  If stable, would plan to resume daily dosing of Lasix.  We will coordinate with cardiologist office for further management of diuretic.  Hypervolemia: Echocardiogram reviewed with diastolic  dysfunction as well as left atrial dilation, likely exacerbated with atrial arrhythmia.  Plan to escalate Lasix to daily all on permanent basis given symptomatic improvement when increased from thrice weekly.  Labs as above.  Chronic cough: Present since the 1990s.  Suspect will be exceedingly difficult to treat given duration of symptoms.  Ongoing work-up and treatment per allergist.  Asthma: Possible contributor to cough.  Spirometry historically with no fixed obstruction but with decreased FVC consistent with with gas trapping versus moderate restriction.  Consider full PFTs in the future if symptoms no longer improve or worsen despite aggressive diuretic therapy.  Return in about 3 months (around 10/28/2021).   Lanier Clam, MD 07/30/2021

## 2021-07-30 NOTE — Progress Notes (Signed)
Labs demonstrate stable potassium and renal function after increase Lasix dose.  Will prescribe Lasix 20 mg daily, 30-day supply given improvement in symptoms and hypoxemia and chest x-ray with increase in diuresis compared to 20 mg 3 times weekly.  April, can you please call the patient and let her know about the labs being stable and the plan for Lasix 20 mg daily as discussed today in the office.  Dr. Dulce Sellar, do you mind arranging follow-up in your office and ongoing prescriptions for increased Lasix dosing and further evaluation at your discretion?

## 2021-07-30 NOTE — Patient Instructions (Addendum)
Nice to meet you  I think most problems with breathing and oxygen are related to he heart and water build up over time  We will get a chest xray today  We will get labs at the [facility] building:  [place]  I will confirm the plan for lasix every day after we get lab results  Return to clinic 3 months or sooner if needed with Dr. Silas Flood

## 2021-08-08 NOTE — Progress Notes (Signed)
Cardiology Office Note:    Date:  08/09/2021   ID:  Karen Snyder, DOB 1941-01-04, MRN [id number]  PCP:  Myrlene Broker, MD  Cardiologist:  Shirlee More, MD    Referring MD: Myrlene Broker, MD    ASSESSMENT:    1. Shortness of breath   2. Paroxysmal atrial fibrillation (HCC)   3. Chronic anticoagulation   4. On amiodarone therapy   5. Hypertensive heart disease without heart failure    PLAN:    In order of problems listed above:  Despite being treated for heart failure weight down no edema she remains short of breath with minor activities and using her home oximeter she has sats of 88 with activity.  Her heart failure does not look decompensated I thought amiodarone had been stopped and I see no alternative with her ongoing shortness of breath except to stop the drug.  I will send a note to my EP colleague Dr. Curt Bears asking for advice Remains in sinus rhythm amiodarone stopped continue her anticoagulant Heart failure looks compensated she has no fluid overload blood pressure target continue her current loop diuretic   Next appointment: 4 weeks   Medication Adjustments/Labs and Tests Ordered: Current medicines are reviewed at length with the patient today.  Concerns regarding medicines are outlined above.  No orders of the defined types were placed in this encounter.  No orders of the defined types were placed in this encounter.   Chief Complaint  Patient presents with   Follow-up   Congestive Heart Failure   Atrial Fibrillation    History of Present Illness:    Karen Snyder is a 81 y.o. female with a hx of atypical atrial flutter previously suppressed on amiodarone but discontinued with concerns of pulmonary injury chronic anticoagulation hypertensive heart disease with heart failure last seen 05/11/2021.  She follows at allergy and immunology as per Dr. Neldon Mc who noted hypoxemia place her on oxygen 79 [address]ie in the office and was concerned she had acute MI cardiac  toxicity and sent her directly to the ED to be evaluated at the [facility].  Compliance with diet, lifestyle and medications: Yes  I had thought amiodarone was discontinued it was not Her weight is down 6 pounds but she remains short of breath and tells me that she has to stop and rest with activities like dressing bathing and at times her saturations are as low as 88% with activity greater than 90 at rest She does not have edema orthopnea chest pain palpitation or syncope  Is seen in consultation by pulmonary for hypoxemia.  CT of the chest high-resolution showed bilateral pleural effusion with findings of atelectasis but concerning for interstitial lung disease.  Diuretic dose was increased she had an 8 pound weight loss and shortness of breath and oxygenation are both improved. Past Medical History:  Diagnosis Date   A-fib [facility])    Asthma    Blepharitis of upper and lower eyelids of both eyes 07/19/2019   Capsulitis of metatarsophalangeal (MTP) joint of left foot 07/01/2016   Dependent edema 06/20/2019   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/19/2019   Environmental and seasonal allergies 04/15/2018   Essential hypertension 04/15/2018   Exertional dyspnea 06/20/2019   GERD (gastroesophageal reflux disease)    GERD without esophagitis 04/15/2018   High blood pressure    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   Mild persistent asthma without complication 123XX123   Peripheral drusen, bilateral 07/19/2019  Postmenopausal bleeding 08/23/2015   Rhinosinusitis 01/11/2020   Screening for colon cancer 08/15/2019   Tachycardia 01/24/2021   TMJ tenderness, right 08/15/2019    Past Surgical History:  Procedure Laterality Date   ADENOIDECTOMY  1946   HERNIA REPAIR  2016   TONSILLECTOMY  1946    Current Medications: Current Meds  Medication Sig   albuterol (PROVENTIL HFA) 108 (90 Base) MCG/ACT inhaler Inhale two puffs every four to six hours as  needed for cough or wheeze.   amiodarone (PACERONE) 200 MG tablet Take 1 tablet (200 mg total) by mouth daily.   apixaban (ELIQUIS) 5 MG TABS tablet Take 1 tablet (5 mg total) by mouth in the morning and at bedtime.   ASMANEX HFA 200 MCG/ACT AERO INHALE TWO DOSES TWICE DAILY TO PREVENT COUGH OR WHEEZE.  RINSE, GARGLE, AND SPIT AFTER USE.   BIOTIN FORTE PO Take 1 tablet by mouth daily.   budesonide (RHINOCORT AQUA) 32 MCG/ACT nasal spray Place 1 spray into both nostrils daily.   calcium-vitamin D (OSCAL WITH D) 500-200 MG-UNIT tablet Take 1 tablet by mouth daily.   DEXILANT 60 MG capsule Take 60 mg by mouth in the morning.    dextromethorphan-guaiFENesin (MUCINEX DM) 30-600 MG 12hr tablet Take 1 tablet by mouth 2 (two) times daily as needed for cough.   diltiazem (CARDIZEM CD) 120 MG 24 hr capsule Take 1 capsule (120 mg total) by mouth daily.   furosemide (LASIX) 20 MG tablet Take 1 tablet (20 mg total) by mouth daily.   Melatonin 10 MG CAPS Take 10 mg by mouth at bedtime.   Multiple Vitamin (MULTI-VITAMIN DAILY PO) Take 1 tablet by mouth daily.   potassium chloride (KLOR-CON) 10 MEQ tablet Take on Monday, Wednesday and Friday. (Patient taking differently: 10 mEq daily.)   telmisartan (MICARDIS) 40 MG tablet Take 1 tablet (40 mg total) by mouth daily.     Allergies:   Patient has no known allergies.   Social History   Socioeconomic History   Marital status: Married    Spouse name: Not on file   Number of children: Not on file   Years of education: Not on file   Highest education level: Not on file  Occupational History   Not on file  Tobacco Use   Smoking status: Never    Passive exposure: Never   Smokeless tobacco: Never  Vaping Use   Vaping Use: Never used  Substance and Sexual Activity   Alcohol use: No   Drug use: No   Sexual activity: Not on file  Other Topics Concern   Not on file  Social History Narrative   Not on file   Social Determinants of Health   Financial  Resource Strain: Not on file  Food Insecurity: Not on file  Transportation Needs: Not on file  Physical Activity: Not on file  Stress: Not on file  Social Connections: Not on file     Family History: The patient's family history includes Atrial fibrillation in her mother; Eczema in her sister; Heart Problems in her maternal grandfather and maternal grandmother; Heart attack in her father; Hypertension in her father and mother. ROS:   Please see the history of present illness.    All other systems reviewed and are negative.  EKGs/Labs/Other Studies Reviewed:    The following studies were reviewed today:  EKG:  EKG ordered today and personally reviewed.  The ekg ordered today demonstrates sinus rhythm right axis deviation otherwise normal EKG  Recent Labs:  02/04/2021: Magnesium 2.0 05/09/2021: ALT 14; TSH 5.840 06/17/2021: NT-Pro BNP 499 07/19/2021: B Natriuretic Peptide 187.4; Hemoglobin 11.1; Platelets 366 07/30/2021: BUN 17; Creatinine, Ser 0.92; Potassium 3.7; Sodium 133  Recent Lipid Panel No results found for: CHOL, TRIG, HDL, CHOLHDL, VLDL, LDLCALC, LDLDIRECT  Physical Exam:    VS:  BP 120/68 (BP Location: Right Arm)    Pulse 75    Ht 5\' 2"  (1.575 m)    Wt 163 lb 12.8 oz (74.3 kg)    SpO2 96%    BMI 29.96 kg/m     Wt Readings from Last 3 Encounters:  08/09/21 163 lb 12.8 oz (74.3 kg)  07/30/21 168 lb 3.2 oz (76.3 kg)  07/19/21 168 lb (76.2 kg)     GEN:  Well nourished, well developed in no acute distress HEENT: Normal NECK: No JVD; No carotid bruits LYMPHATICS: No lymphadenopathy CARDIAC: RRR, no murmurs, rubs, gallops RESPIRATORY:  Clear to auscultation without rales, wheezing or rhonchi  ABDOMEN: Soft, non-tender, non-distended MUSCULOSKELETAL:  No edema; No deformity  SKIN: Warm and dry NEUROLOGIC:  Alert and oriented x 3 PSYCHIATRIC:  Normal affect    Signed, Shirlee More, MD  08/09/2021 1:42 PM    [facility]

## 2021-08-09 ENCOUNTER — Telehealth: Payer: Self-pay | Admitting: Cardiology

## 2021-08-09 ENCOUNTER — Telehealth: Payer: Self-pay

## 2021-08-09 ENCOUNTER — Other Ambulatory Visit: Payer: Self-pay

## 2021-08-09 ENCOUNTER — Ambulatory Visit: Payer: Medicare PPO | Admitting: Cardiology

## 2021-08-09 ENCOUNTER — Encounter: Payer: Self-pay | Admitting: Cardiology

## 2021-08-09 VITALS — BP 120/68 | HR 75 | Ht 62.0 in | Wt 163.8 lb

## 2021-08-09 DIAGNOSIS — R0602 Shortness of breath: Secondary | ICD-10-CM

## 2021-08-09 DIAGNOSIS — I480 Paroxysmal atrial fibrillation: Secondary | ICD-10-CM

## 2021-08-09 DIAGNOSIS — Z79899 Other long term (current) drug therapy: Secondary | ICD-10-CM

## 2021-08-09 DIAGNOSIS — Z7901 Long term (current) use of anticoagulants: Secondary | ICD-10-CM

## 2021-08-09 DIAGNOSIS — I119 Hypertensive heart disease without heart failure: Secondary | ICD-10-CM

## 2021-08-09 NOTE — Telephone Encounter (Signed)
Dr. Bettina Gavia would like for Karen Snyder to come in two weeks to have an EKG completed and an amiodarone level drawn. I placed the order for this but left a message with the patient to call and schedule. Will continue to try to reach her.

## 2021-08-09 NOTE — Telephone Encounter (Signed)
Pt returning phone call... please advise  

## 2021-08-09 NOTE — Telephone Encounter (Signed)
Spoke to the patient just now and got her scheduled for this nurse visit. She verbalizes understanding and thanks me for the call back.    Encouraged patient to call back with any questions or concerns.

## 2021-08-09 NOTE — Patient Instructions (Signed)
Medication Instructions:  Your physician has recommended you make the following change in your medication:  STOP: Amiodarone *If you need a refill on your cardiac medications before your next appointment, please call your pharmacy*   Lab Work: None If you have labs (blood work) drawn today and your tests are completely normal, you will receive your results only by: MyChart Message (if you have MyChart) OR A paper copy in the mail If you have any lab test that is abnormal or we need to change your treatment, we will call you to review the results.   Testing/Procedures: None   Follow-Up: At [facility], you and your health needs are our priority.  As part of our continuing mission to provide you with exceptional heart care, we have created designated Provider Care Teams.  These Care Teams include your primary Cardiologist (physician) and Advanced Practice Providers (APPs -  Physician Assistants and Nurse Practitioners) who all work together to provide you with the care you need, when you need it.  We recommend signing up for the patient portal called "MyChart".  Sign up information is provided on this After Visit Summary.  MyChart is used to connect with patients for Virtual Visits (Telemedicine).  Patients are able to view lab/test results, encounter notes, upcoming appointments, etc.  Non-urgent messages can be sent to your provider as well.   To learn more about what you can do with MyChart, go to [email or web address].    Your next appointment:   4 week(s)  The format for your next appointment:   In Person  Provider:   Norman Herrlich, MD    Other Instructions

## 2021-08-15 ENCOUNTER — Ambulatory Visit: Payer: Medicare PPO | Admitting: Allergy and Immunology

## 2021-08-15 ENCOUNTER — Encounter: Payer: Self-pay | Admitting: Allergy and Immunology

## 2021-08-15 ENCOUNTER — Other Ambulatory Visit: Payer: Self-pay

## 2021-08-15 VITALS — BP 126/64 | HR 76 | Resp 14 | Wt 164.0 lb

## 2021-08-15 DIAGNOSIS — J4530 Mild persistent asthma, uncomplicated: Secondary | ICD-10-CM | POA: Diagnosis not present

## 2021-08-15 DIAGNOSIS — J3089 Other allergic rhinitis: Secondary | ICD-10-CM

## 2021-08-15 DIAGNOSIS — K219 Gastro-esophageal reflux disease without esophagitis: Secondary | ICD-10-CM

## 2021-08-15 NOTE — Progress Notes (Signed)
[name]   Follow-up Note  Referring Provider: Myrlene Broker, MD Primary Provider: Myrlene Broker, MD Date of Office Visit: 08/15/2021  Subjective:   Karen Snyder (DOB: [birth year]) is a 81 y.o. female who returns to the [facility] [place] on 08/15/2021 in re-evaluation of the following:  HPI: Karen Snyder returns to this clinic in evaluation of asthma, allergic rhinitis, LPR, and a recent issue tied up with hypoxemia.  I last saw her in his clinic on 18 July 2021.  When I last saw her in this clinic she was hypoxic at rest.  She had evidence of fluid overload and she was on amiodarone.  We started her on oxygen and had her reevaluated with Dr. Bettina Snyder, cardiology, and had her evaluated by Dr. Silas Snyder, pulmonology.  She has since been diuresed several pounds of fluid and she has had her amiodarone discontinued.  She is feeling a lot better.  Her shortness of breath has improved.  She still has a chronic cough that wax and wane throughout the day but this has been a persistent issue for decades.  Her reflux has not been causing her any problem.  Allergies as of 08/15/2021   No Known Allergies      Medication List    albuterol 108 (90 Base) MCG/ACT inhaler Commonly known as: Proventil HFA Inhale two puffs every four to six hours as needed for cough or wheeze.   apixaban 5 MG Tabs tablet Commonly known as: ELIQUIS Take 1 tablet (5 mg total) by mouth in the morning and at bedtime.   Asmanex HFA 200 MCG/ACT Aero Generic drug: Mometasone Furoate INHALE TWO DOSES TWICE DAILY TO PREVENT COUGH OR WHEEZE.  RINSE, GARGLE, AND SPIT AFTER USE.   BIOTIN FORTE PO Take 1 tablet by mouth daily.   budesonide 32 MCG/ACT nasal spray Commonly known as: RHINOCORT AQUA Place 1 spray into both nostrils daily.   calcium-vitamin D 500-200 MG-UNIT tablet Commonly known as: OSCAL WITH D Take 1 tablet by mouth daily.   Dexilant 60 MG  capsule Generic drug: dexlansoprazole Take 60 mg by mouth in the morning.   dextromethorphan-guaiFENesin 30-600 MG 12hr tablet Commonly known as: MUCINEX DM Take 1 tablet by mouth 2 (two) times daily as needed for cough.   diltiazem 120 MG 24 hr capsule Commonly known as: DILACOR XR Take 120 mg by mouth daily.   furosemide 20 MG tablet Commonly known as: Lasix Take 1 tablet (20 mg total) by mouth daily.   Melatonin 10 MG Caps Take 10 mg by mouth at bedtime.   MULTI-VITAMIN DAILY PO Take 1 tablet by mouth daily.   potassium chloride 10 MEQ CR capsule Commonly known as: MICRO-K Take 10 mEq by mouth daily.   telmisartan 40 MG tablet Commonly known as: MICARDIS Take 1 tablet (40 mg total) by mouth daily.    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   HENT: Negative.    Eyes: Negative.   Respiratory: Negative.    Cardiovascular: Negative.    Gastrointestinal: Negative.   Genitourinary: Negative.   Musculoskeletal: Negative.   Skin: Negative.   Neurological: Negative.   Endo/Heme/Allergies: Negative.   Psychiatric/Behavioral: Negative.      Objective:   Vitals:   08/15/21 1052 08/15/21 1136  BP: 126/64   Pulse: 76   Resp: 14   SpO2: 97% 92%      Weight: 164 lb (74.4 kg)   Physical Exam Constitutional:      Appearance: She is not diaphoretic.  HENT:     Head: Normocephalic.     Right Ear: Tympanic membrane, ear canal and external ear normal.     Left Ear: Tympanic membrane, ear canal and external ear normal.     Nose: Nose normal. No mucosal edema or rhinorrhea.     Mouth/Throat:     Pharynx: Uvula midline. No oropharyngeal exudate.  Eyes:     Conjunctiva/sclera: Conjunctivae normal.  Neck:     Thyroid: No thyromegaly.     Trachea: Trachea normal. No tracheal tenderness or tracheal deviation.  Cardiovascular:     Rate and Rhythm: Normal rate and regular rhythm.     Heart sounds: Normal heart sounds, S1 normal and S2 normal. No murmur heard. Pulmonary:     Effort: No respiratory distress.     Breath sounds: Normal breath sounds. No stridor. No wheezing or rales.  Lymphadenopathy:     Head:     Right side of head: No tonsillar adenopathy.     Left side of head: No tonsillar adenopathy.     Cervical: No cervical adenopathy.  Skin:    Findings: No erythema or rash.     Nails: There is no clubbing.  Neurological:     Mental Status: She is alert.    Diagnostics:   Oxygen saturation on rest at room air was 97%.  Oxygen saturation walking the hallway on room air was 92%  Results of a high-resolution chest CT scan obtained 19 July 2021 identified the following:  Cardiovascular: Top-normal heart size. No significant pericardial effusion/thickening. Left anterior descending and left circumflex coronary atherosclerosis. Atherosclerotic nonaneurysmal thoracic aorta. Normal caliber pulmonary  arteries.   Mediastinum/Nodes: No discrete thyroid nodules. Unremarkable esophagus. No axillary adenopathy. Mildly enlarged 1.1 cm subcarinal node (series 4/image 66). No discrete hilar adenopathy on these noncontrast images.   Lungs/Pleura: No pneumothorax. Small dependent bilateral pleural effusions, right greater than left. Solid anterior right upper lobe 0.4 cm pulmonary nodule (series 6/image 39). No lung masses or additional significant pulmonary nodules. Mild patchy air trapping in both lungs on the expiration sequence, without evidence of tracheobronchomalacia. Mild platelike consolidation in the dependent and basilar lower lobes bilaterally, right greater than left, with some associated volume loss, favoring passive atelectasis. Patchy subpleural reticulation and ground-glass opacity in the bilateral lower lobes and scattered mild interlobular septal thickening in both lungs. No significant regions of traction  bronchiectasis, architectural distortion or frank honeycombing.  Assessment and Plan:   1. Asthma, well controlled, mild persistent   2. Other allergic rhinitis   3. LPRD (laryngopharyngeal reflux disease)      1. Continue to Treat reflux:   A.  Minimize caffeine and chocolate consumption    B.  Dexilant 60 mg 1 time a day in a.m.   2. Continue to Treat inflammation:   A.  Asmanex 200 HFA - 2 inhalations 2 time per day (empty lungs)  B.  OTC Rhinocort - one spray each nostril once a day  3. If needed:   A. Proventil HFA 2 puffs every 4-6 hours  B. OTC antihistamine  C. OTC Mucinex DM  D. Nasal saline  E. OTC Pataday-1 drop each eye 1 time per day  4.  Return to clinic in 6 months or earlier if problem  Zyanna appeared to be in fluid overload and having an adverse effect secondary to amiodarone administration during her last visit and she is much better at this point in time now that those issues have been addressed.  I am going to continue to have her use  her chronic therapy directed against reflux induced respiratory disease and inflammation of her airway as noted above and she can follow-up with cardiology and pulmonology regarding further evaluation and management of her recent hypoxic event.  I will see her back in this clinic in 6 months or earlier if there is a problem.   Allena Katz, MD Allergy / Immunology [place]

## 2021-08-15 NOTE — Patient Instructions (Addendum)
°  1. Continue to Treat reflux:   A.  Minimize caffeine and chocolate consumption    B.  Dexilant 60 mg 1 time a day in a.m.   2. Continue to Treat inflammation:   A.  Asmanex 200 HFA - 2 inhalations 2 time per day (empty lungs)  B.  OTC Rhinocort - one spray each nostril once a day  3. If needed:   A. Proventil HFA 2 puffs every 4-6 hours  B. OTC antihistamine  C. OTC Mucinex DM  D. Nasal saline  E. OTC Pataday-1 drop each eye 1 time per day  4.  Return to clinic in 6 months or earlier if problem

## 2021-08-16 ENCOUNTER — Telehealth: Payer: Self-pay | Admitting: Cardiology

## 2021-08-16 MED ORDER — POTASSIUM CHLORIDE ER 10 MEQ PO CPCR: 10.0000 meq | ORAL_CAPSULE | Freq: Every day | ORAL | 3 refills | Status: DC

## 2021-08-16 NOTE — Addendum Note (Signed)
Addended by: Eleonore Chiquito on: 08/16/2021 04:05 PM   Modules accepted: Orders

## 2021-08-16 NOTE — Telephone Encounter (Signed)
Pt states the pulmonary doctor has put her on lasix 20 mg daily and pt would like to know if she need the potassium 10 mEq daily. How do you advise?

## 2021-08-16 NOTE — Telephone Encounter (Signed)
Pt aware that she is to continue her potassium per Dr. Dulce Sellar. RX sent to pharmacy.

## 2021-08-16 NOTE — Telephone Encounter (Signed)
Patient has questions about her potassium medication, if she could get a call back at [phone]

## 2021-08-19 ENCOUNTER — Encounter: Payer: Self-pay | Admitting: Allergy and Immunology

## 2021-08-23 ENCOUNTER — Other Ambulatory Visit: Payer: Self-pay | Admitting: *Deleted

## 2021-08-23 ENCOUNTER — Other Ambulatory Visit: Payer: Self-pay | Admitting: Cardiology

## 2021-08-23 ENCOUNTER — Ambulatory Visit (INDEPENDENT_AMBULATORY_CARE_PROVIDER_SITE_OTHER): Payer: Medicare PPO

## 2021-08-23 ENCOUNTER — Other Ambulatory Visit: Payer: Self-pay

## 2021-08-23 DIAGNOSIS — Z79899 Other long term (current) drug therapy: Secondary | ICD-10-CM

## 2021-08-23 DIAGNOSIS — I480 Paroxysmal atrial fibrillation: Secondary | ICD-10-CM

## 2021-08-23 NOTE — Progress Notes (Signed)
Patient came in today for an EKG and lab work post stopping amiodarone. EKG was reviewed by Dr. Bettina Gavia who states that it is good. She had her labs drawn and was discharged.

## 2021-08-23 NOTE — Addendum Note (Signed)
Addended by: Roosvelt Harps R on: 08/23/2021 01:50 PM   Modules accepted: Orders

## 2021-08-29 LAB — AMIODARONE (CORDARONE), S/P
AMIODARONE: 679 ng/mL — ABNORMAL LOW (ref 1000–2500)
DESETHYLAMIODARONE: 1039 ng/mL

## 2021-09-12 ENCOUNTER — Other Ambulatory Visit: Payer: Self-pay | Admitting: Cardiology

## 2021-09-18 NOTE — Progress Notes (Signed)
?Cardiology Office Note:   ? ?Date:  09/19/2021  ? ?ID:  [name], DOB 16-Oct-1940, MRN [id number] ? ?PCP:  Myrlene Broker, MD  ?Cardiologist:  Shirlee More, MD   ? ?Referring MD: Myrlene Broker, MD  ? ? ?ASSESSMENT:   ? ?1. Paroxysmal atrial fibrillation (HCC)   ?2. Chronic anticoagulation   ?3. Hypertensive heart disease without heart failure   ? ?PLAN:   ? ?In order of problems listed above: ? ?Clinically appears to still be in sinus rhythm check EKG if confirmed start her on antiarrhythmic therapy with dronedarone as she is washed out her amiodarone. ?Continue anticoagulation ?Improved on stopping amiodarone and increasing her diuretic and continue the higher dose and recheck labs including potassium renal function and a proBNP level ? ? ?Next appointment: 3 months she will come back and repeat an EKG 2 weeks ? ? ?Medication Adjustments/Labs and Tests Ordered: ?Current medicines are reviewed at length with the patient today.  Concerns regarding medicines are outlined above.  ?No orders of the defined types were placed in this encounter. ? ?No orders of the defined types were placed in this encounter. ? ?Chief complaint follow-up for heart failure atrial fibrillation off amiodarone ? ? ?History of Present Illness:   ? ?Lyria Leyden is a 81 y.o. female with a hx of atypical atrial flutter previously suppressed on amiodarone but discontinued with concerns of pulmonary injury chronic anticoagulation hypertensive heart disease with heart failure last seen 05/11/2021.  She follows at allergy and immunology as per Dr. Neldon Mc who noted hypoxemia place her on oxygen. ?Compliance with diet, lifestyle and medications: Yes ?She is clearly improved off amiodarone with no desaturation. ? she had a heart rate greater than 100 on 1 occasion ?Mild exertional shortness of breath improved.  No edema or palpitation. ?She had a pulmonary appointment they increased her diuretic to daily however endeavors has improved the quality  of her life. ?Past Medical History:  ?Diagnosis Date  ? A-fib ([place])   ? Asthma   ? Blepharitis of upper and lower eyelids of both eyes 07/19/2019  ? Capsulitis of metatarsophalangeal (MTP) joint of left foot 07/01/2016  ? Dependent edema 06/20/2019  ? 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/19/2019  ? Environmental and seasonal allergies 04/15/2018  ? Essential hypertension 04/15/2018  ? Exertional dyspnea 06/20/2019  ? GERD (gastroesophageal reflux disease)   ? GERD without esophagitis 04/15/2018  ? High blood pressure   ? 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 Mild persistent asthma without complication 123XX123  ? Peripheral drusen, bilateral 07/19/2019  ? Postmenopausal bleeding 08/23/2015  ? Rhinosinusitis 01/11/2020  ? Screening for colon cancer 08/15/2019  ? Tachycardia 01/24/2021  ? TMJ tenderness, right 08/15/2019  ? ? ?Past Surgical History:  ?Procedure Laterality Date  ? ADENOIDECTOMY  1946  ? HERNIA REPAIR  2016  ? TONSILLECTOMY  1946  ? ? ?Current Medications: ?Current Meds  ?Medication Sig  ? albuterol (PROVENTIL HFA) 108 (90 Base) MCG/ACT inhaler Inhale two puffs every four to six hours as needed for cough or wheeze.  ? apixaban (ELIQUIS) 5 MG TABS tablet Take 1 tablet (5 mg total) by mouth in the morning and at bedtime.  ? ASMANEX HFA 200 MCG/ACT AERO INHALE TWO DOSES TWICE DAILY TO PREVENT COUGH OR WHEEZE.  RINSE, GARGLE, AND SPIT AFTER USE.  ? BIOTIN FORTE PO Take 1 tablet by mouth daily.  ? budesonide (RHINOCORT AQUA) 32 MCG/ACT nasal spray Place 1 spray into  both nostrils daily.  ? calcium-vitamin D (OSCAL WITH D) 500-200 MG-UNIT tablet Take 1 tablet by mouth daily.  ? DEXILANT 60 MG capsule Take 60 mg by mouth in the morning.   ? dextromethorphan-guaiFENesin (MUCINEX DM) 30-600 MG 12hr tablet Take 1 tablet by mouth 2 (two) times daily as needed for cough.  ? diltiazem (DILACOR XR) 120 MG 24 hr capsule Take 120 mg by mouth daily.  ? furosemide (LASIX) 20 MG tablet Take  1 tablet (20 mg total) by mouth daily.  ? Melatonin 10 MG CAPS Take 10 mg by mouth at bedtime.  ? Multiple Vitamin (MULTI-VITAMIN DAILY PO) Take 1 tablet by mouth daily.  ? potassium chloride (MICRO-K) 10 MEQ CR capsule Take 1 capsule (10 mEq total) by mouth daily.  ? telmisartan (MICARDIS) 40 MG tablet Take 1 tablet (40 mg total) by mouth daily.  ?  ? ?Allergies:   Patient has no known allergies.  ? ?Social History  ? ?Socioeconomic History  ? Marital status: Married  ?  Spouse name: Not on file  ? Number of children: Not on file  ? Years of education: Not on file  ? Highest education level: Not on file  ?Occupational History  ? Not on file  ?Tobacco Use  ? Smoking status: Never  ?  Passive exposure: Never  ? Smokeless tobacco: Never  ?Vaping Use  ? Vaping Use: Never used  ?Substance and Sexual Activity  ? Alcohol use: No  ? Drug use: No  ? Sexual activity: Not on file  ?Other Topics Concern  ? Not on file  ?Social History Narrative  ? Not on file  ? ?Social Determinants of Health  ? ?Financial Resource Strain: Not on file  ?Food Insecurity: Not on file  ?Transportation Needs: Not on file  ?Physical Activity: Not on file  ?Stress: Not on file  ?Social Connections: Not on file  ?  ? ?Family History: ?The patient's family history includes Atrial fibrillation in her mother; Eczema in her sister; Heart Problems in her maternal grandfather and maternal grandmother; Heart attack in her father; Hypertension in her father and mother. ?ROS:   ?Please see the history of present illness.    ?All other systems reviewed and are negative. ? ?EKGs/Labs/Other Studies Reviewed:   ? ?The following studies were reviewed today: ? ?EKG:  EKG ordered today and personally reviewed.  The ekg ordered today demonstrates sinus rhythm rightward axis otherwise normal EKG, there may be lead I and aVL reversal. ? ?Recent Labs: ?02/04/2021: Magnesium 2.0 ?05/09/2021: ALT 14; TSH 5.840 ?06/17/2021: NT-Pro BNP 499 ?07/19/2021: B Natriuretic Peptide  187.4; Hemoglobin 11.1; Platelets 366 ?07/30/2021: BUN 17; Creatinine, Ser 0.92; Potassium 3.7; Sodium 133  ?Recent Lipid Panel ?No results found for: CHOL, TRIG, HDL, CHOLHDL, VLDL, LDLCALC, LDLDIRECT ? ?Physical Exam:   ? ?VS:  BP 130/60 (BP Location: Left Arm, Patient Position: Sitting, Cuff Size: Normal)   Pulse 76   Ht 5\' 2"  (1.575 m)   Wt 165 lb (74.8 kg)   SpO2 97%   BMI 30.18 kg/m?    ? ?Wt Readings from Last 3 Encounters:  ?09/19/21 165 lb (74.8 kg)  ?08/23/21 165 lb (74.8 kg)  ?08/15/21 164 lb (74.4 kg)  ?  ? ?GEN: She looks quite improved well nourished, well developed in no acute distress ?HEENT: Normal ?NECK: No JVD; No carotid bruits ?LYMPHATICS: No lymphadenopathy ?CARDIAC: RRR, no murmurs, rubs, gallops ?RESPIRATORY:  Clear to auscultation without rales, wheezing or rhonchi  ?ABDOMEN: Soft,  non-tender, non-distended ?MUSCULOSKELETAL:  No edema; No deformity  ?SKIN: Warm and dry ?NEUROLOGIC:  Alert and oriented x 3 ?PSYCHIATRIC:  Normal affect  ? ? ?Signed, ?Shirlee More, MD  ?09/19/2021 10:57 AM    ?[place]  ?

## 2021-09-19 ENCOUNTER — Encounter: Payer: Self-pay | Admitting: Cardiology

## 2021-09-19 ENCOUNTER — Ambulatory Visit: Payer: Medicare PPO | Admitting: Cardiology

## 2021-09-19 ENCOUNTER — Other Ambulatory Visit: Payer: Self-pay

## 2021-09-19 ENCOUNTER — Telehealth: Payer: Self-pay | Admitting: *Deleted

## 2021-09-19 VITALS — BP 130/60 | HR 76 | Ht 62.0 in | Wt 165.0 lb

## 2021-09-19 DIAGNOSIS — I119 Hypertensive heart disease without heart failure: Secondary | ICD-10-CM

## 2021-09-19 DIAGNOSIS — Z7901 Long term (current) use of anticoagulants: Secondary | ICD-10-CM | POA: Diagnosis not present

## 2021-09-19 DIAGNOSIS — I480 Paroxysmal atrial fibrillation: Secondary | ICD-10-CM | POA: Diagnosis not present

## 2021-09-19 MED ORDER — MULTAQ 400 MG PO TABS: 400.0000 mg | ORAL_TABLET | Freq: Two times a day (BID) | ORAL | 1 refills | Status: DC

## 2021-09-19 NOTE — Addendum Note (Signed)
Addended by: Roosvelt Harps R on: 09/19/2021 04:44 PM ? ? Modules accepted: Orders ? ?

## 2021-09-19 NOTE — Telephone Encounter (Signed)
Lvm for patient to come back to office when she can  to get her samples of Multaq 400 mg twice a day 1 bottle.  ?

## 2021-09-19 NOTE — Patient Instructions (Addendum)
Medication Instructions:  ? ?START TAKING MULTAQ 400 MG  TWICE  A DAY  ? ?*If you need a refill on your cardiac medications before your next appointment, please call your pharmacy* ? ? ?Lab Work: CMP AND PRO BNP  ? ?If you have labs (blood work) drawn today and your tests are completely normal, you will receive your results only by: ?MyChart Message (if you have MyChart) OR ?A paper copy in the mail ?If you have any lab test that is abnormal or we need to change your treatment, we will call you to review the results. ? ? ?Testing/Procedures:NONE ORDERED  TODAY ? ? ? ?Follow-Up: ?At [facility], you and your health needs are our priority.  As part of our continuing mission to provide you with exceptional heart care, we have created designated Provider Care Teams.  These Care Teams include your primary Cardiologist (physician) and Advanced Practice Providers (APPs -  Physician Assistants and Nurse Practitioners) who all work together to provide you with the care you need, when you need it. ? ?We recommend signing up for the patient portal called "MyChart".  Sign up information is provided on this After Visit Summary.  MyChart is used to connect with patients for Virtual Visits (Telemedicine).  Patients are able to view lab/test results, encounter notes, upcoming appointments, etc.  Non-urgent messages can be sent to your provider as well.   ?To learn more about what you can do with MyChart, go to [email or web address].   ? ?Your next appointment:  ?  ?2 week(s) FOR EKG  NURSE VISIT . ? ?AND 3 MONTHS  ? ?The format for your next appointment:   ?In Person ? ?Provider:   ?Norman Herrlich, MD  ? ? ?Other Instructions ? ?

## 2021-09-20 LAB — COMPREHENSIVE METABOLIC PANEL
ALT: 14 IU/L (ref 0–32)
AST: 15 IU/L (ref 0–40)
Albumin/Globulin Ratio: 1.7 (ref 1.2–2.2)
Albumin: 4.5 g/dL (ref 3.6–4.6)
Alkaline Phosphatase: 77 IU/L (ref 44–121)
BUN/Creatinine Ratio: 21 (ref 12–28)
BUN: 18 mg/dL (ref 8–27)
Bilirubin Total: 0.4 mg/dL (ref 0.0–1.2)
CO2: 24 mmol/L (ref 20–29)
Calcium: 9.4 mg/dL (ref 8.7–10.3)
Chloride: 100 mmol/L (ref 96–106)
Creatinine, Ser: 0.85 mg/dL (ref 0.57–1.00)
Globulin, Total: 2.6 g/dL (ref 1.5–4.5)
Glucose: 92 mg/dL (ref 70–99)
Potassium: 4.4 mmol/L (ref 3.5–5.2)
Sodium: 141 mmol/L (ref 134–144)
Total Protein: 7.1 g/dL (ref 6.0–8.5)
eGFR: 69 mL/min/{1.73_m2} (ref >59)

## 2021-09-20 LAB — PRO B NATRIURETIC PEPTIDE: NT-Pro BNP: 381 pg/mL (ref 0–738)

## 2021-10-03 ENCOUNTER — Ambulatory Visit (INDEPENDENT_AMBULATORY_CARE_PROVIDER_SITE_OTHER): Payer: Medicare PPO

## 2021-10-03 ENCOUNTER — Other Ambulatory Visit: Payer: Self-pay

## 2021-10-03 VITALS — BP 140/68 | HR 68 | Ht 62.0 in | Wt 167.0 lb

## 2021-10-03 DIAGNOSIS — Z79899 Other long term (current) drug therapy: Secondary | ICD-10-CM | POA: Diagnosis not present

## 2021-10-03 NOTE — Progress Notes (Signed)
? ?  Nurse Visit  ?  ?Date of Encounter: 10/03/2021 ?ID: Karen Snyder, DOB 01-Dec-1940, MRN [id number] ? ?PCP:  Hadley Pen, MD ?  ?Cardiologist:  Dr. Dulce Sellar ?Advanced Practice Provider:  No care team member to display ?Electrophysiologist:  None  ? ? ?Visit Details  ? ?VS:  BP 140/68 (BP Location: Right Arm, Patient Position: Sitting, Cuff Size: Normal)   Pulse 68   Ht 5\' 2"  (1.575 m)   Wt 167 lb (75.8 kg)   SpO2 92%   BMI 30.54 kg/m?  , BMI Body mass index is 30.54 kg/m?. ? ?Wt Readings from Last 3 Encounters:  ?10/03/21 167 lb (75.8 kg)  ?09/19/21 165 lb (74.8 kg)  ?08/23/21 165 lb (74.8 kg)  ?  ? ?Reason for visit: To perform an EKG because the patient was switched from amiodarone to multaq and has been on multaq for 2 weeks ?Performed today: Vitals, EKG, Education ?Changes (medications, testing, etc.) : There were no new orders for this patient ?Length of Visit: 20 minutes ? ?Medications Adjustments/Labs and Tests Ordered: ?Orders Placed This Encounter  ?Procedures  ? EKG 12-Lead  ? ?No orders of the defined types were placed in this encounter. ? ? ?Signed, ?10/21/21, RN  ?10/03/2021 1:25 PM ? ? ? ? ?

## 2021-10-16 ENCOUNTER — Encounter: Payer: Self-pay | Admitting: Cardiology

## 2021-10-22 ENCOUNTER — Ambulatory Visit (INDEPENDENT_AMBULATORY_CARE_PROVIDER_SITE_OTHER): Payer: Medicare PPO

## 2021-10-22 VITALS — BP 138/58 | HR 78 | Resp 20 | Ht 63.0 in | Wt 166.2 lb

## 2021-10-22 DIAGNOSIS — I4891 Unspecified atrial fibrillation: Secondary | ICD-10-CM | POA: Diagnosis not present

## 2021-10-22 NOTE — Progress Notes (Signed)
? ?  Nurse Visit  ? ?Date of Encounter: 10/22/2021 ?ID: Karen Snyder, DOB [birth year], MRN [id number] ? ?PCP:  Hadley Pen, MD ?  ?[facility] Providers ?Cardiologist:  None    ? ? ?Visit Details  ? ?VS:  There were no vitals taken for this visit. , BMI There is no height or weight on file to calculate BMI. ? ?Wt Readings from Last 3 Encounters:  ?10/03/21 167 lb (75.8 kg)  ?09/19/21 165 lb (74.8 kg)  ?08/23/21 165 lb (74.8 kg)  ?  ? ?Reason for visit: EKG ?Performed today: Vitals, EKG, Provider consulted:[place], and Education ?Changes (medications, testing, etc.) : No changes ?Length of Visit: 5 minutes ? ? ? ?Medications Adjustments/Labs and Tests Ordered: ?No orders of the defined types were placed in this encounter. ? ?No orders of the defined types were placed in this encounter. ? ? ? ?Signed, ?Eleonore Chiquito, RN  ?10/22/2021 11:18 AM  ?

## 2021-10-22 NOTE — Addendum Note (Signed)
Addended by: Bula Cavalieri, Elmarie Shiley L on: 10/22/2021 11:31 AM ? ? Modules accepted: Orders ? ?

## 2021-11-04 ENCOUNTER — Ambulatory Visit: Payer: Medicare PPO | Admitting: Pulmonary Disease

## 2021-11-04 ENCOUNTER — Encounter: Payer: Self-pay | Admitting: Pulmonary Disease

## 2021-11-04 VITALS — BP 120/78 | HR 76 | Temp 98.2°F | Ht 62.0 in | Wt 165.2 lb

## 2021-11-04 DIAGNOSIS — R0609 Other forms of dyspnea: Secondary | ICD-10-CM | POA: Diagnosis not present

## 2021-11-04 DIAGNOSIS — J4530 Mild persistent asthma, uncomplicated: Secondary | ICD-10-CM

## 2021-11-04 NOTE — Progress Notes (Signed)
? ?@Patient  ID: Karen Snyder, female    DOB: 23-Aug-1940, 81 y.o.   MRN: [id number] ? ?Chief Complaint  ?Patient presents with  ? Follow-up  ?  Follow up. Pt states she started Multaq and that she has up and down days She states she started this medication the first week of march.   ? ? ?Referring provider: ?Myrlene Broker, MD ? ?HPI:  ? ?81 y.o. woman whom we are seeing in follow up  for evaluation of acute hypoxemic respiratory failure and DOE felt largely due to hypervolemia related to CHF worsened in atrial arrhythmia.  Most recent cardiology note reviewed.   ? ?Overall doing okay.  Not using oxygen during the day.  Sats stayed in the 90s for the most part.  Occasionally low 90s.  Nothing below 90.  Using nocturnal oxygen.  She would not sometimes wake up with the oxygen off and it would drop down below 90, high 80s.  For the last several weeks when she wakes up afterwards, often staying in the low to mid 90s.  She continues nocturnal oxygen.  She denies any oxygen desaturations at rest or with exertion currently during the day.  She continues her Lasix daily.  This helped tremendously in terms of her swelling as well as her dyspnea and hypoxemia.  She was also recently put on Multaq per the patient's cardiologist.  Per most recent cardiology note this helped tremendously in terms of atrial fibrillation control.  She is in sinus rhythm on exam today.  He does have intermittent short bouts of extreme dyspnea.  Seem to resolve quite quickly.  ? ?HPI at initial visit: ?Patient with few months of gradual dyspnea.  Issue with chronic cough for decades.  Was seen in her allergist and noted to be incidentally hypoxemic.  Oxygen was ordered for her.  Work-up included CT high-res on my review interpretation shows bilateral pleural effusions with streaky opacity in the bases favored to represent atelectasis.  Read concern for possible ILD.  Her diuretic was increased from Lasix 20 mg every Monday Wednesday Friday to 20 mg  daily.  She reports 8 pound weight loss in that week.  Her dyspnea has improved.  She walked in the clinic today and oxygen saturation 96% on room air.  She does at home without oxygen over the last few days her oxygen saturation has not dropped below 88%.  We discussed at length her imaging findings which to me is most consistent with volume overload.  Improvement in symptoms and hypoxemia with diuresis fits with this.  Fibrosis from any kind will not improve with diuresis alone. ? ?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, seasonal allergies, asthma ?Surgical history: Adenoidectomy/tonsillectomy, hernia repair ?Family history: Mother with hypertension, atrial fibrillation, father with hypertension, CAD ?Social history: Never smoker, lives in [place] ? ? ?Questionaires / Pulmonary Flowsheets:  ? ?ACT:  ?Asthma Control Test ACT Total Score  ?07/15/2017 ? 3:00 PM 23  ?02/18/2017 ? 3:00 PM 24  ? ? ?MMRC: ?   ? View : No data to display.  ?  ?  ?  ? ? ?Epworth:  ?   ? View : No data to display.  ?  ?  ?  ? ? ?Tests:  ? ?FENO:  ?No results found for: NITRICOXIDE ? ?PFT: ?   ? View : No data to display.  ?  ?  ?  ? ? ?WALK:  ?   ? View : No data to display.  ?  ?  ?  ? ? ?  Imaging: ?Personally reviewed and as per EMR discussion this note ?No results found. ? ?Lab Results: ?Personally reviewed ?CBC ?   ?Component Value Date/Time  ? WBC 9.0 07/19/2021 1115  ? RBC 4.02 07/19/2021 1115  ? HGB 11.1 (L) 07/19/2021 1115  ? HCT 34.3 (L) 07/19/2021 1115  ? PLT 366 07/19/2021 1115  ? MCV 85.3 07/19/2021 1115  ? MCH 27.6 07/19/2021 1115  ? MCHC 32.4 07/19/2021 1115  ? RDW 13.8 07/19/2021 1115  ? LYMPHSABS 0.7 07/19/2021 1115  ? MONOABS 0.7 07/19/2021 1115  ? EOSABS 0.1 07/19/2021 1115  ? BASOSABS 0.1 07/19/2021 1115  ? ? ?BMET ?   ?Component Value Date/Time  ? NA 141 09/19/2021 1117  ? K 4.4 09/19/2021 1117  ? CL 100 09/19/2021 1117  ? CO2 24 09/19/2021 1117  ? GLUCOSE 92 09/19/2021 1117  ? GLUCOSE 98  07/30/2021 1011  ? BUN 18 09/19/2021 1117  ? CREATININE 0.85 09/19/2021 1117  ? CALCIUM 9.4 09/19/2021 1117  ? GFRNONAA >60 07/19/2021 1115  ? ? ?BNP ?   ?Component Value Date/Time  ? BNP 187.4 (H) 07/19/2021 1115  ? ? ?ProBNP ?   ?Component Value Date/Time  ? PROBNP 381 09/19/2021 1117  ? ? ?Specialty Problems   ? ?  ? Pulmonary Problems  ? Mild persistent asthma without complication  ? Bronchitis  ? Exertional dyspnea  ? Rhinosinusitis  ? Asthma  ? ? ?No Known Allergies ? ?Immunization History  ?Administered Date(s) Administered  ? Fluad Quad(high Dose 65+) 05/09/2021  ? Influenza, High Dose Seasonal PF 04/29/2017, 04/15/2018, 04/25/2019, 05/11/2020  ? Influenza-Unspecified 05/22/2006, 06/09/2007, 04/15/2008  ? Pneumococcal Conjugate-13 01/16/2018  ? Pneumococcal Polysaccharide-23 04/25/2019  ? Tdap 02/16/2007  ? ? ?Past Medical History:  ?Diagnosis Date  ? A-fib ([place])   ? Asthma   ? Blepharitis of upper and lower eyelids of both eyes 07/19/2019  ? Capsulitis of metatarsophalangeal (MTP) joint of left foot 07/01/2016  ? Dependent edema 06/20/2019  ? 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/19/2019  ? Environmental and seasonal allergies 04/15/2018  ? Essential hypertension 04/15/2018  ? Exertional dyspnea 06/20/2019  ? GERD (gastroesophageal reflux disease)   ? GERD without esophagitis 04/15/2018  ? High blood pressure   ? 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 Mild persistent asthma without complication 123XX123  ? Peripheral drusen, bilateral 07/19/2019  ? Postmenopausal bleeding 08/23/2015  ? Rhinosinusitis 01/11/2020  ? Screening for colon cancer 08/15/2019  ? Tachycardia 01/24/2021  ? TMJ tenderness, right 08/15/2019  ? ? ?Tobacco History: ?Social History  ? ?Tobacco Use  ?Smoking Status Never  ? Passive exposure: Never  ?Smokeless Tobacco Never  ? ?Counseling given: Not Answered ? ? ?Continue to not smoke ? ?Outpatient Encounter Medications as of 11/04/2021  ?Medication Sig  ?  albuterol (PROVENTIL HFA) 108 (90 Base) MCG/ACT inhaler Inhale two puffs every four to six hours as needed for cough or wheeze.  ? apixaban (ELIQUIS) 5 MG TABS tablet Take 1 tablet (5 mg total) by mouth in the morning and at bedtime.  ? BIOTIN FORTE PO Take 1 tablet by mouth daily.  ? budesonide (RHINOCORT AQUA) 32 MCG/ACT nasal spray Place 1 spray into both nostrils daily.  ? calcium-vitamin D (OSCAL WITH D) 500-200 MG-UNIT tablet Take 1 tablet by mouth daily.  ? DEXILANT 60 MG capsule Take 60 mg by mouth in the morning.   ? dextromethorphan-guaiFENesin (MUCINEX DM) 30-600 MG 12hr tablet Take 1 tablet by mouth  2 (two) times daily as needed for cough.  ? diltiazem (DILACOR XR) 120 MG 24 hr capsule Take 120 mg by mouth daily.  ? dronedarone (MULTAQ) 400 MG tablet Take 1 tablet (400 mg total) by mouth 2 (two) times daily with a meal.  ? furosemide (LASIX) 20 MG tablet Take 1 tablet (20 mg total) by mouth daily.  ? Melatonin 10 MG CAPS Take 10 mg by mouth at bedtime.  ? Multiple Vitamin (MULTI-VITAMIN DAILY PO) Take 1 tablet by mouth daily.  ? potassium chloride (MICRO-K) 10 MEQ CR capsule Take 1 capsule (10 mEq total) by mouth daily.  ? telmisartan (MICARDIS) 40 MG tablet Take 1 tablet (40 mg total) by mouth daily.  ? [DISCONTINUED] ASMANEX HFA 200 MCG/ACT AERO INHALE TWO DOSES TWICE DAILY TO PREVENT COUGH OR WHEEZE.  RINSE, GARGLE, AND SPIT AFTER USE.  ? ?No facility-administered encounter medications on file as of 11/04/2021.  ? ? ? ?Review of Systems ? ?Review of Systems  ?No chest pain with exertion.  No orthopnea or PND.  Comprehensive review of systems otherwise negative. ?Physical Exam ? ?BP 120/78 (BP Location: Left Arm, Patient Position: Sitting, Cuff Size: Normal)   Pulse 76   Temp 98.2 ?F (36.8 ?C) (Oral)   Ht 5\' 2"  (1.575 m)   Wt 165 lb 3.2 oz (74.9 kg)   SpO2 98%   BMI 30.22 kg/m?  ? ?Wt Readings from Last 5 Encounters:  ?11/04/21 165 lb 3.2 oz (74.9 kg)  ?10/22/21 166 lb 3.2 oz (75.4 kg)  ?10/03/21  167 lb (75.8 kg)  ?09/19/21 165 lb (74.8 kg)  ?08/23/21 165 lb (74.8 kg)  ? ? ?BMI Readings from Last 5 Encounters:  ?11/04/21 30.22 kg/m?  ?10/22/21 29.44 kg/m?  ?10/03/21 30.54 kg/m?  ?09/19/21 30.18 kg/m?  ?08/23/21

## 2021-11-04 NOTE — Patient Instructions (Signed)
Nice to see you again ? ?We will get pulmonary function tests in a bout 4 weeks and talk about the results with a visit with me after th tests are performed ? ?Return to clinic in 4 weeks or sooner as needed  ?

## 2021-12-25 NOTE — Progress Notes (Signed)
Cardiology Office Note:    Date:  12/26/2021   ID:  Karen Snyder, DOB April 22, 1941, MRN [id number]  PCP:  Myrlene Broker, MD  Cardiologist:  Shirlee More, MD    Referring MD: Myrlene Broker, MD    ASSESSMENT:    1. Paroxysmal atrial fibrillation (HCC)   2. High risk medication use   3. Chronic anticoagulation   4. Hypertensive heart disease without heart failure    PLAN:    In order of problems listed above:  She continues to do well maintaining sinus rhythm on dronedarone continue the same along with her current anticoagulant Heart failure is compensated continue her current loop diuretic and antihypertensive medications   Next appointment: 6 months   Medication Adjustments/Labs and Tests Ordered: Current medicines are reviewed at length with the patient today.  Concerns regarding medicines are outlined above.  No orders of the defined types were placed in this encounter.  No orders of the defined types were placed in this encounter.   No chief complaint on file.   History of Present Illness:    Karen Snyder is a 81 y.o. female with a hx of atrial flutter previously suppressed on amiodarone but discontinued with concern of pulmonary injury chronic anticoagulation hypertensive heart disease with heart failure and hypoxemia improved with discontinuation of amiodarone therapy last seen 09/19/2021. Compliance with diet, lifestyle and medications: yes  This became a complicated visit I was unaware she was admitted to [facility] with respiratory failure parainfluenza as well as pneumonia and was treated with antibiotics.  She had an EKG 12/04/2021 I personally reviewed sinus rhythm right axis deviation nonspecific T waves Since discharge she has been much better less short of breath oxygen sats were 97% weight does not vary no edema orthopnea and less exertional shortness of breath She has a chronic cough they put her on Tussionex and I directed her primary care.   Physician for refills No chest pain palpitation or syncope Past Medical History:  Diagnosis Date   A-fib ([place])    Asthma    Blepharitis of upper and lower eyelids of both eyes 07/19/2019   Capsulitis of metatarsophalangeal (MTP) joint of left foot 07/01/2016   Dependent edema 06/20/2019   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/19/2019   Environmental and seasonal allergies 04/15/2018   Essential hypertension 04/15/2018   Exertional dyspnea 06/20/2019   GERD (gastroesophageal reflux disease)    GERD without esophagitis 04/15/2018   High blood pressure    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   Mild persistent asthma without complication 123XX123   Peripheral drusen, bilateral 07/19/2019   Postmenopausal bleeding 08/23/2015   Rhinosinusitis 01/11/2020   Screening for colon cancer 08/15/2019   Tachycardia 01/24/2021   TMJ tenderness, right 08/15/2019    Past Surgical History:  Procedure Laterality Date   ADENOIDECTOMY  1946   HERNIA REPAIR  2016   TONSILLECTOMY  1946    Current Medications: Current Meds  Medication Sig   albuterol (PROVENTIL HFA) 108 (90 Base) MCG/ACT inhaler Inhale two puffs every four to six hours as needed for cough or wheeze.   apixaban (ELIQUIS) 5 MG TABS tablet Take 1 tablet (5 mg total) by mouth in the morning and at bedtime.   BIOTIN FORTE PO Take 1 tablet by mouth daily.   budesonide (RHINOCORT AQUA) 32 MCG/ACT nasal spray Place 1 spray into both nostrils daily.   calcium-vitamin D (OSCAL WITH D) 500-200 MG-UNIT tablet Take 1 tablet by  mouth daily.   chlorpheniramine-HYDROcodone (TUSSIONEX PENNKINETIC ER) 10-8 MG/5ML Take 5 mLs by mouth every 12 (twelve) hours as needed for cough.   DEXILANT 60 MG capsule Take 60 mg by mouth in the morning.    diltiazem (DILACOR XR) 120 MG 24 hr capsule Take 120 mg by mouth daily.   dronedarone (MULTAQ) 400 MG tablet Take 1 tablet (400 mg total) by mouth 2 (two) times daily with a meal.    furosemide (LASIX) 20 MG tablet Take 1 tablet (20 mg total) by mouth daily.   Melatonin 10 MG CAPS Take 10 mg by mouth at bedtime.   Multiple Vitamin (MULTI-VITAMIN DAILY PO) Take 1 tablet by mouth daily.   potassium chloride (MICRO-K) 10 MEQ CR capsule Take 1 capsule (10 mEq total) by mouth daily.   telmisartan (MICARDIS) 40 MG tablet Take 1 tablet (40 mg total) by mouth daily.     Allergies:   Patient has no known allergies.   Social History   Socioeconomic History   Marital status: Married    Spouse name: Not on file   Number of children: Not on file   Years of education: Not on file   Highest education level: Not on file  Occupational History   Not on file  Tobacco Use   Smoking status: Never    Passive exposure: Never   Smokeless tobacco: Never  Vaping Use   Vaping Use: Never used  Substance and Sexual Activity   Alcohol use: No   Drug use: No   Sexual activity: Not on file  Other Topics Concern   Not on file  Social History Narrative   Not on file   Social Determinants of Health   Financial Resource Strain: Not on file  Food Insecurity: Not on file  Transportation Needs: Not on file  Physical Activity: Not on file  Stress: Not on file  Social Connections: Not on file     Family History: The patient's family history includes Atrial fibrillation in her mother; Eczema in her sister; Heart Problems in her maternal grandfather and maternal grandmother; Heart attack in her father; Hypertension in her father and mother. ROS:   Please see the history of present illness.    All other systems reviewed and are negative.  EKGs/Labs/Other Studies Reviewed:    The following studies were reviewed today:   Recent Labs: 02/04/2021: Magnesium 2.0 05/09/2021: TSH 5.840 07/19/2021: B Natriuretic Peptide 187.4; Hemoglobin 11.1; Platelets 366 09/19/2021: ALT 14; BUN 18; Creatinine, Ser 0.85; NT-Pro BNP 381; Potassium 4.4; Sodium 141  Recent Lipid Panel No results found for:  "CHOL", "TRIG", "HDL", "CHOLHDL", "VLDL", "LDLCALC", "LDLDIRECT"  Physical Exam:    VS:  BP 132/76 (BP Location: Left Arm, Patient Position: Sitting)   Pulse 82   Ht 5\' 2"  (1.575 m)   Wt 159 lb 6.4 oz (72.3 kg)   SpO2 98%   BMI 29.15 kg/m     Wt Readings from Last 3 Encounters:  12/26/21 159 lb 6.4 oz (72.3 kg)  11/04/21 165 lb 3.2 oz (74.9 kg)  10/22/21 166 lb 3.2 oz (75.4 kg)     GEN:  Well nourished, well developed in no acute distress HEENT: Normal NECK: No JVD; No carotid bruits LYMPHATICS: No lymphadenopathy CARDIAC: RRR, no murmurs, rubs, gallops RESPIRATORY:  Clear to auscultation without rales, wheezing or rhonchi  ABDOMEN: Soft, non-tender, non-distended MUSCULOSKELETAL:  No edema; No deformity  SKIN: Warm and dry NEUROLOGIC:  Alert and oriented x 3 PSYCHIATRIC:  Normal affect  Signed, Shirlee More, MD  12/26/2021 1:38 PM    [place]

## 2021-12-26 ENCOUNTER — Encounter: Payer: Self-pay | Admitting: Cardiology

## 2021-12-26 ENCOUNTER — Ambulatory Visit: Payer: Medicare PPO | Admitting: Cardiology

## 2021-12-26 VITALS — BP 132/76 | HR 82 | Ht 62.0 in | Wt 159.4 lb

## 2021-12-26 DIAGNOSIS — I119 Hypertensive heart disease without heart failure: Secondary | ICD-10-CM | POA: Diagnosis not present

## 2021-12-26 DIAGNOSIS — I480 Paroxysmal atrial fibrillation: Secondary | ICD-10-CM | POA: Diagnosis not present

## 2021-12-26 DIAGNOSIS — Z7901 Long term (current) use of anticoagulants: Secondary | ICD-10-CM | POA: Diagnosis not present

## 2021-12-26 DIAGNOSIS — Z79899 Other long term (current) drug therapy: Secondary | ICD-10-CM

## 2021-12-26 MED ORDER — DILTIAZEM HCL ER 120 MG PO CP24: 120.0000 mg | ORAL_CAPSULE | Freq: Every day | ORAL | 3 refills | Status: DC

## 2021-12-26 MED ORDER — APIXABAN 5 MG PO TABS: 5.0000 mg | ORAL_TABLET | Freq: Two times a day (BID) | ORAL | 3 refills | Status: DC

## 2021-12-26 MED ORDER — MULTAQ 400 MG PO TABS: 400.0000 mg | ORAL_TABLET | Freq: Two times a day (BID) | ORAL | 3 refills | Status: DC

## 2021-12-26 MED ORDER — TELMISARTAN 40 MG PO TABS: 40.0000 mg | ORAL_TABLET | Freq: Every day | ORAL | 3 refills | Status: DC

## 2021-12-26 MED ORDER — FUROSEMIDE 20 MG PO TABS: 20.0000 mg | ORAL_TABLET | Freq: Every day | ORAL | 3 refills | Status: DC

## 2021-12-26 MED ORDER — POTASSIUM CHLORIDE ER 10 MEQ PO CPCR: 10.0000 meq | ORAL_CAPSULE | Freq: Every day | ORAL | 3 refills | Status: DC

## 2021-12-26 NOTE — Patient Instructions (Signed)
Medication Instructions:  ?Your physician recommends that you continue on your current medications as directed. Please refer to the Current Medication list given to you today. ? ?*If you need a refill on your cardiac medications before your next appointment, please call your pharmacy* ? ? ?Lab Work: ?None ?If you have labs (blood work) drawn today and your tests are completely normal, you will receive your results only by: ?MyChart Message (if you have MyChart) OR ?A paper copy in the mail ?If you have any lab test that is abnormal or we need to change your treatment, we will call you to review the results. ? ? ?Testing/Procedures: ?None ? ? ?Follow-Up: ?At [facility], you and your health needs are our priority.  As part of our continuing mission to provide you with exceptional heart care, we have created designated Provider Care Teams.  These Care Teams include your primary Cardiologist (physician) and Advanced Practice Providers (APPs -  Physician Assistants and Nurse Practitioners) who all work together to provide you with the care you need, when you need it. ? ?We recommend signing up for the patient portal called "MyChart".  Sign up information is provided on this After Visit Summary.  MyChart is used to connect with patients for Virtual Visits (Telemedicine).  Patients are able to view lab/test results, encounter notes, upcoming appointments, etc.  Non-urgent messages can be sent to your provider as well.   ?To learn more about what you can do with MyChart, go to [email or web address].   ? ?Your next appointment:   ?Follow up as needed. ? ?The format for your next appointment:   ?In Person ? ?Provider:   ?Brian Munley, MD{ ? ? ?Other Instructions ?None ? ?Important Information About Sugar ? ? ? ? ? ? ?

## 2022-01-02 ENCOUNTER — Encounter: Payer: Self-pay | Admitting: Pulmonary Disease

## 2022-01-02 ENCOUNTER — Ambulatory Visit (INDEPENDENT_AMBULATORY_CARE_PROVIDER_SITE_OTHER): Payer: Medicare PPO | Admitting: Pulmonary Disease

## 2022-01-02 ENCOUNTER — Ambulatory Visit: Payer: Medicare PPO | Admitting: Pulmonary Disease

## 2022-01-02 VITALS — BP 132/68 | HR 87 | Temp 98.2°F | Ht 62.5 in | Wt 159.8 lb

## 2022-01-02 DIAGNOSIS — J4520 Mild intermittent asthma, uncomplicated: Secondary | ICD-10-CM | POA: Diagnosis not present

## 2022-01-02 DIAGNOSIS — J4530 Mild persistent asthma, uncomplicated: Secondary | ICD-10-CM | POA: Diagnosis not present

## 2022-01-02 DIAGNOSIS — R0609 Other forms of dyspnea: Secondary | ICD-10-CM

## 2022-01-02 LAB — PULMONARY FUNCTION TEST
DL/VA % pred: 79 %
DL/VA: 3.3 ml/min/mmHg/L
DLCO cor % pred: 61 %
DLCO cor: 10.84 ml/min/mmHg
DLCO unc % pred: 61 %
DLCO unc: 10.84 ml/min/mmHg
FEF 25-75 Post: 1.88 L/sec
FEF 25-75 Pre: 1.43 L/sec
FEF2575-%Change-Post: 31 %
FEF2575-%Pred-Post: 150 %
FEF2575-%Pred-Pre: 115 %
FEV1-%Change-Post: 6 %
FEV1-%Pred-Post: 98 %
FEV1-%Pred-Pre: 92 %
FEV1-Post: 1.69 L
FEV1-Pre: 1.58 L
FEV1FVC-%Change-Post: 2 %
FEV1FVC-%Pred-Pre: 108 %
FEV6-%Change-Post: 3 %
FEV6-%Pred-Post: 94 %
FEV6-%Pred-Pre: 90 %
FEV6-Post: 2.06 L
FEV6-Pre: 1.99 L
FEV6FVC-%Pred-Post: 106 %
FEV6FVC-%Pred-Pre: 106 %
FVC-%Change-Post: 3 %
FVC-%Pred-Post: 88 %
FVC-%Pred-Pre: 85 %
FVC-Post: 2.06 L
FVC-Pre: 1.99 L
Post FEV1/FVC ratio: 82 %
Post FEV6/FVC ratio: 100 %
Pre FEV1/FVC ratio: 80 %
Pre FEV6/FVC Ratio: 100 %
RV % pred: 72 %
RV: 1.67 L
TLC % pred: 94 %
TLC: 4.48 L

## 2022-01-02 MED ORDER — HYDROCOD POLI-CHLORPHE POLI ER 10-8 MG/5ML PO SUER: 5.0000 mL | Freq: Every evening | ORAL | 0 refills | Status: DC | PRN

## 2022-01-02 NOTE — Progress Notes (Signed)
Full PFT Performed Today  

## 2022-01-02 NOTE — Progress Notes (Signed)
@Patient  ID: , female    DOB: [birth year], 81 y.o.   MRN: [id number]  Chief Complaint  Patient presents with   Follow-up    Pt is here for follow up for asthma. Pt sis her full pfts done today. Pt I son albuterol as needed. Pt states she went to the ED in  May for PNA and had fluid drained off of her.     Referring provider: June, MD  HPI:   81 y.o. woman whom we are seeing in follow up  for evaluation of acute hypoxemic respiratory failure and DOE felt largely due to hypervolemia related to CHF worsened in atrial arrhythmia.  Most recent PCP  note reviewed.    Overall doing okay.  Hospitalized about 1 month ago. Worsened DOE, cough. Needed 4L [place] at first. CT report with bilateral effusions with unilateral GGOs. RVP positive for parainfluenza 3.  Underwent thoracentesis.  Cannot see results.  Reported did not grow any organisms.  Been doing well since discharge.  Continues with bad cough.  Again present for a decade or more.  More like 15 years.  Only thing improved with Tussionex.  HPI at initial visit: Patient with few months of gradual dyspnea.  Issue with chronic cough for decades.  Was seen in her allergist and noted to be incidentally hypoxemic.  Oxygen was ordered for her.  Work-up included CT high-res on my review interpretation shows bilateral pleural effusions with streaky opacity in the bases favored to represent atelectasis.  Read concern for possible ILD.  Her diuretic was increased from Lasix 20 mg every Monday Wednesday Friday to 20 mg daily.  She reports 8 pound weight loss in that week.  Her dyspnea has improved.  She walked in the clinic today and oxygen saturation 96% on room air.  She does at home without oxygen over the last few days her oxygen saturation has not dropped below 88%.  We discussed at length her imaging findings which to me is most consistent with volume overload.  Improvement in symptoms and hypoxemia with diuresis fits with this.   Fibrosis from any kind will not improve with diuresis alone.  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, seasonal allergies, asthma Surgical history: Adenoidectomy/tonsillectomy, hernia repair Family history: Mother with hypertension, atrial fibrillation, father with hypertension, CAD Social history: Never smoker, lives in [facility]   Questionaires / Pulmonary Flowsheets:   ACT:  Asthma Control Test ACT Total Score  07/15/2017  3:00 PM 23  02/18/2017  3:00 PM 24    MMRC:     No data to display           Epworth:      No data to display           Tests:   FENO:  No results found for: "NITRICOXIDE"  PFT:    Latest Ref Rng & Units 01/02/2022    9:45 AM  PFT Results  FVC-Pre L 1.99  P  FVC-Predicted Pre % 85  P  FVC-Post L 2.06  P  FVC-Predicted Post % 88  P  Pre FEV1/FVC % % 80  P  Post FEV1/FCV % % 82  P  FEV1-Pre L 1.58  P  FEV1-Predicted Pre % 92  P  FEV1-Post L 1.69  P  DLCO uncorrected ml/min/mmHg 10.84  P  DLCO UNC% % 61  P  DLCO corrected ml/min/mmHg 10.84  P  DLCO COR %Predicted % 61  P  DLVA Predicted %  79  P  TLC L 4.48  P  TLC % Predicted % 94  P  RV % Predicted % 72  P    P Preliminary result   Spirometry reviewed interpreted as normal, no bronchodilator response, lung volumes within normal limits, DLCO cannot be interpreted given lack of repeatability, lack of quality per ATS standards  WALK:      No data to display           Imaging: Personally reviewed and as per EMR discussion this note No results found.  Lab Results: Personally reviewed CBC    Component Value Date/Time   WBC 9.0 07/19/2021 1115   RBC 4.02 07/19/2021 1115   HGB 11.1 (L) 07/19/2021 1115   HCT 34.3 (L) 07/19/2021 1115   PLT 366 07/19/2021 1115   MCV 85.3 07/19/2021 1115   MCH 27.6 07/19/2021 1115   MCHC 32.4 07/19/2021 1115   RDW 13.8 07/19/2021 1115   LYMPHSABS 0.7 07/19/2021 1115   MONOABS 0.7 07/19/2021 1115   EOSABS 0.1  07/19/2021 1115   BASOSABS 0.1 07/19/2021 1115    BMET    Component Value Date/Time   NA 141 09/19/2021 1117   K 4.4 09/19/2021 1117   CL 100 09/19/2021 1117   CO2 24 09/19/2021 1117   GLUCOSE 92 09/19/2021 1117   GLUCOSE 98 07/30/2021 1011   BUN 18 09/19/2021 1117   CREATININE 0.85 09/19/2021 1117   CALCIUM 9.4 09/19/2021 1117   GFRNONAA >60 07/19/2021 1115    BNP    Component Value Date/Time   BNP 187.4 (H) 07/19/2021 1115    ProBNP    Component Value Date/Time   PROBNP 381 09/19/2021 1117    Specialty Problems       Pulmonary Problems   Mild persistent asthma without complication   Bronchitis   Exertional dyspnea   Rhinosinusitis   Asthma    No Known Allergies  Immunization History  Administered Date(s) Administered   Fluad Quad(high Dose 65+) 05/09/2021   Influenza, High Dose Seasonal PF 04/29/2017, 04/15/2018, 04/25/2019, 05/11/2020   Influenza-Unspecified 05/22/2006, 06/09/2007, 04/15/2008   PFIZER(Purple Top)SARS-COV-2 Vaccination 07/27/2019, 08/17/2019   Pneumococcal Conjugate-13 01/16/2018   Pneumococcal Polysaccharide-23 04/25/2019   Tdap 02/16/2007    Past Medical History:  Diagnosis Date   A-fib ([place])    Asthma    Blepharitis of upper and lower eyelids of both eyes 07/19/2019   Capsulitis of metatarsophalangeal (MTP) joint of left foot 07/01/2016   Dependent edema 06/20/2019   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/19/2019   Environmental and seasonal allergies 04/15/2018   Essential hypertension 04/15/2018   Exertional dyspnea 06/20/2019   GERD (gastroesophageal reflux disease)    GERD without esophagitis 04/15/2018   High blood pressure    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   Mild persistent asthma without complication [id number]   Peripheral drusen, bilateral 07/19/2019   Postmenopausal bleeding 08/23/2015   Rhinosinusitis 01/11/2020   Screening for colon cancer 08/15/2019   Tachycardia 01/24/2021    TMJ tenderness, right 08/15/2019    Tobacco History: Social History   Tobacco Use  Smoking Status Never   Passive exposure: Never  Smokeless Tobacco Never   Counseling given: Not Answered   Continue to not smoke  Outpatient Encounter Medications as of 01/02/2022  Medication Sig   albuterol (PROVENTIL HFA) 108 (90 Base) MCG/ACT inhaler Inhale two puffs every four to six hours as needed for cough or wheeze.   apixaban (ELIQUIS) 5 MG  TABS tablet Take 1 tablet (5 mg total) by mouth in the morning and at bedtime.   BIOTIN FORTE PO Take 1 tablet by mouth daily.   budesonide (RHINOCORT AQUA) 32 MCG/ACT nasal spray Place 1 spray into both nostrils daily.   calcium-vitamin D (OSCAL WITH D) 500-200 MG-UNIT tablet Take 1 tablet by mouth daily.   chlorpheniramine-HYDROcodone 10-8 MG/5ML Take 5 mLs by mouth at bedtime as needed for cough.   DEXILANT 60 MG capsule Take 60 mg by mouth in the morning.    dextromethorphan-guaiFENesin (MUCINEX DM) 30-600 MG 12hr tablet Take 1 tablet by mouth 2 (two) times daily as needed for cough.   diltiazem (DILACOR XR) 120 MG 24 hr capsule Take 1 capsule (120 mg total) by mouth daily.   dronedarone (MULTAQ) 400 MG tablet Take 1 tablet (400 mg total) by mouth 2 (two) times daily with a meal.   furosemide (LASIX) 20 MG tablet Take 1 tablet (20 mg total) by mouth daily.   Melatonin 10 MG CAPS Take 10 mg by mouth at bedtime.   Multiple Vitamin (MULTI-VITAMIN DAILY PO) Take 1 tablet by mouth daily.   potassium chloride (MICRO-K) 10 MEQ CR capsule Take 1 capsule (10 mEq total) by mouth daily.   telmisartan (MICARDIS) 40 MG tablet Take 1 tablet (40 mg total) by mouth daily.   [DISCONTINUED] chlorpheniramine-HYDROcodone (TUSSIONEX PENNKINETIC ER) 10-8 MG/5ML Take 5 mLs by mouth every 12 (twelve) hours as needed for cough. (Patient not taking: Reported on 01/02/2022)   No facility-administered encounter medications on file as of 01/02/2022.     Review of  Systems  Review of Systems  N/a Physical Exam  BP 132/68 (BP Location: Right Arm, Patient Position: Sitting, Cuff Size: Normal)   Pulse 87   Temp 98.2 F (36.8 C) (Oral)   Ht 5' 2.5" (1.588 m)   Wt 159 lb 12.8 oz (72.5 kg)   SpO2 97%   BMI 28.76 kg/m   Wt Readings from Last 5 Encounters:  01/02/22 159 lb 12.8 oz (72.5 kg)  12/26/21 159 lb 6.4 oz (72.3 kg)  11/04/21 165 lb 3.2 oz (74.9 kg)  10/22/21 166 lb 3.2 oz (75.4 kg)  10/03/21 167 lb (75.8 kg)    BMI Readings from Last 5 Encounters:  01/02/22 28.76 kg/m  12/26/21 29.15 kg/m  11/04/21 30.22 kg/m  10/22/21 29.44 kg/m  10/03/21 30.54 kg/m     Physical Exam General: well appearing, in NAD Eyes: EOMI, no icterus Neck: supple, no JVP Pulm: Normal work of breathing, clear CV: Irregular, no murmur Abd: ND, BS present MSK: No synovitis, No joint effusion Neuro: Normal gait, no weakness Psych: Normal mood, full affect  Assessment & Plan:   Acute hypoxemic respiratory failure/nocturnal hypoxemia: Noted by allergist 06/2021 in the office.  She has chronic pleural effusions dating back to at least 01/2021.  CT high-res 07/19/2021 consistent with volume overload.  Unable to wean off oxygen in interim with increased diuresis.  Using at night.  Parainfluenza pneumonia: With groundglass opacities.  Likely superimposed volume overload leading to worsening hypoxemia in the hospital with bilateral pleural effusions.  She is improved.  Thoracentesis reportedly without organisms.  Cannot see results in terms of transudate versus exudate.    Chronic cough: Present since the 1990s.  Suspect will be exceedingly difficult to treat given duration of symptoms.  Ongoing work-up and treatment per allergist. Not improved on ICS.  Small supply of Tussionex to be used at night only as needed.  Asthma: Reported  history of the same.  PFTs without hyperinflation, air trapping, or bronchodilator response.  Cough not improved with  intensification of asthma therapies.  Okay to continue ICS.  Albuterol as needed.  Return in about 3 months (around 04/04/2022).   Lanier Clam, MD 01/02/2022

## 2022-01-02 NOTE — Patient Instructions (Signed)
Full PFT Performed Today  

## 2022-01-02 NOTE — Patient Instructions (Addendum)
Nice to see you  I sent a prescription for Tussionex cough syrup - use at night as needed  No medication changes  Return to clinic in 3 months or sooner as needed

## 2022-02-17 ENCOUNTER — Ambulatory Visit: Payer: Medicare PPO | Admitting: Allergy and Immunology

## 2022-02-17 ENCOUNTER — Encounter: Payer: Self-pay | Admitting: Allergy and Immunology

## 2022-02-17 VITALS — BP 128/64 | HR 76 | Resp 14 | Ht 61.5 in | Wt 162.6 lb

## 2022-02-17 DIAGNOSIS — J4530 Mild persistent asthma, uncomplicated: Secondary | ICD-10-CM | POA: Diagnosis not present

## 2022-02-17 DIAGNOSIS — K219 Gastro-esophageal reflux disease without esophagitis: Secondary | ICD-10-CM | POA: Diagnosis not present

## 2022-02-17 DIAGNOSIS — R0902 Hypoxemia: Secondary | ICD-10-CM

## 2022-02-17 DIAGNOSIS — J3089 Other allergic rhinitis: Secondary | ICD-10-CM | POA: Diagnosis not present

## 2022-02-17 DIAGNOSIS — G4720 Circadian rhythm sleep disorder, unspecified type: Secondary | ICD-10-CM

## 2022-02-17 NOTE — Patient Instructions (Addendum)
  1. Continue to Treat reflux:   A.  Minimize caffeine and chocolate consumption    B.  Dexilant 60 mg 1 time a day in a.m.   2. Continue to Treat inflammation:   A.  Asmanex 200 HFA - 2 inhalations 1-2 time per day (empty lungs)  3. If needed:   A. Proventil HFA 2 puffs every 4-6 hours  B. OTC antihistamine  C. OTC Mucinex DM  D. Nasal saline  E. OTC Pataday-1 drop each eye 1 time per day  F. Astepro - 1-2 sprays each nostril 1-2 times per day  4.  Return to clinic in 6 months or earlier if problem  5. Obtain fall flu vaccine  6. Obtain home sleep study

## 2022-02-17 NOTE — Progress Notes (Unsigned)
[place] [facility] [place] [facility] [place] [facility] [place] [facility] [name]   Follow-up Note  Referring Provider: Hadley Pen, MD Primary Provider: Hadley Pen, MD Date of Office Visit: 02/17/2022  Subjective:   Karen Snyder (DOB: 29-Dec-1940) is a 81 y.o. female who [facility] in re-evaluation of the following:  HPI: Lynnae returns to this clinic in evaluation of asthma, allergic rhinitis, LPR, and hypoxemia.  I last saw her in this clinic on 15 August 2021.  During the tail end of December 2022 she had documented hypoxemia secondary to heart failure and we referred her back to cardiology and she had diuresis and alteration of her atrial fibrillation medication and she has done much better and at this point time she uses oxygen just at nighttime.  She unfortunately contracted parainfluenza infection May 2023 that required hospitalization and what sounds like thoracentesis.  Fortunately, this issue appears to have resolved and she is now back to her chronic cough as it existed prior to this event.  Currently she does not use any albuterol and continues on a low-dose of inhaled steroids and continues to treat LPR with a proton pump inhibitor.  She relates to me the fact that she does not sleep.  She has very fractured sleep at nighttime.  There has been an attempt to obtain a sleep study in the past but logistically this has been very difficult.  She still continues on oxygen at nighttime.  Allergies as of 02/17/2022   No Known Allergies      Medication List    albuterol 108 (90 Base) MCG/ACT inhaler Commonly known as: Proventil HFA Inhale two puffs every four to six hours as needed for cough or wheeze.   apixaban 5 MG Tabs tablet Commonly known as: ELIQUIS Take 1 tablet (5 mg total) by mouth in the morning and at bedtime.   Asmanex HFA 200 MCG/ACT Aero Generic drug: Mometasone Furoate Inhale 2 puffs into the lungs 2 (two) times daily.    Astepro 0.15 % Soln Generic drug: Azelastine HCl Place 1 spray into both nostrils at bedtime.   BIOTIN FORTE PO Take 1 tablet by mouth daily.   budesonide 32 MCG/ACT nasal spray Commonly known as: RHINOCORT AQUA Place 1 spray into both nostrils daily.   calcium-vitamin D 500-200 MG-UNIT tablet Commonly known as: OSCAL WITH D Take 1 tablet by mouth daily.   chlorpheniramine-HYDROcodone 10-8 MG/5ML Take 5 mLs by mouth at bedtime as needed for cough.   Dexilant 60 MG capsule Generic drug: dexlansoprazole Take 60 mg by mouth in the morning.   diltiazem 120 MG 24 hr capsule Commonly known as: DILACOR XR Take 1 capsule (120 mg total) by mouth daily.   furosemide 20 MG tablet Commonly known as: LASIX Take 1 tablet (20 mg total) by mouth daily.   Melatonin 10 MG Caps Take 10 mg by mouth at bedtime.   Multaq 400 MG tablet Generic drug: dronedarone Take 1 tablet (400 mg total) by mouth 2 (two) times daily with a meal.   MULTI-VITAMIN DAILY PO Take 1 tablet by mouth daily.   potassium chloride 10 MEQ CR capsule Commonly known as: MICRO-K Take 1 capsule (10 mEq total) by mouth daily.   ROBITUSSIN NIGHT RELIEF PO Take by mouth.   telmisartan 40 MG tablet Commonly known as: MICARDIS Take 1 tablet (40 mg total) by mouth daily.    Past Medical History:  Diagnosis Date   A-fib ([facility])  Asthma    Blepharitis of upper and lower eyelids of both eyes 07/19/2019   Capsulitis of metatarsophalangeal (MTP) joint of left foot 07/01/2016   Dependent edema 06/20/2019   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/19/2019   Environmental and seasonal allergies 04/15/2018   Essential hypertension 04/15/2018   Exertional dyspnea 06/20/2019   GERD (gastroesophageal reflux disease)    GERD without esophagitis 04/15/2018   High blood pressure    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   Mild persistent asthma without complication 04/15/2018   Peripheral drusen,  bilateral 07/19/2019   Postmenopausal bleeding 08/23/2015   Rhinosinusitis 01/11/2020   Screening for colon cancer 08/15/2019   Tachycardia 01/24/2021   TMJ tenderness, right 08/15/2019    Past Surgical History:  Procedure Laterality Date   ADENOIDECTOMY  1946   HERNIA REPAIR  2016   TONSILLECTOMY  1946    Review of systems negative except as noted in HPI / PMHx or noted below:  Review of Systems  Constitutional: Negative.   HENT: Negative.    Eyes: Negative.   Respiratory: Negative.    Cardiovascular: Negative.   Gastrointestinal: Negative.   Genitourinary: Negative.   Musculoskeletal: Negative.   Skin: Negative.   Neurological: Negative.   Endo/Heme/Allergies: Negative.   Psychiatric/Behavioral: Negative.       Objective:   Vitals:   02/17/22 1145  BP: 128/64  Pulse: 76  Resp: 14  SpO2: 96%   Height: 5' 1.5" (156.2 cm)  Weight: 162 lb 9.6 oz (73.8 kg)   Physical Exam Constitutional:      Appearance: She is not diaphoretic.  HENT:     Head: Normocephalic.     Right Ear: Tympanic membrane, ear canal and external ear normal.     Left Ear: Tympanic membrane, ear canal and external ear normal.     Nose: Nose normal. No mucosal edema or rhinorrhea.     Mouth/Throat:     Pharynx: Uvula midline. No oropharyngeal exudate.  Eyes:     Conjunctiva/sclera: Conjunctivae normal.  Neck:     Thyroid: No thyromegaly.     Trachea: Trachea normal. No tracheal tenderness or tracheal deviation.  Cardiovascular:     Rate and Rhythm: Normal rate and regular rhythm.     Heart sounds: Normal heart sounds, S1 normal and S2 normal. No murmur heard. Pulmonary:     Effort: No respiratory distress.     Breath sounds: Normal breath sounds. No stridor. No wheezing or rales.  Lymphadenopathy:     Head:     Right side of head: No tonsillar adenopathy.     Left side of head: No tonsillar adenopathy.     Cervical: No cervical adenopathy.  Skin:    Findings: No erythema or rash.      Nails: There is no clubbing.  Neurological:     Mental Status: She is alert.     Diagnostics:    Spirometry was performed and demonstrated an FEV1 of 1.36 at 80 % of predicted.  Assessment and Plan:   1. Asthma, well controlled, mild persistent   2. Other allergic rhinitis   3. LPRD (laryngopharyngeal reflux disease)   4. Hypoxemia   5. Dysfunction of sleep stage or arousal      1. Continue to Treat reflux:   A.  Minimize caffeine and chocolate consumption    B.  Dexilant 60 mg 1 time a day in a.m.   2. Continue to Treat inflammation:   A.  Asmanex 200 HFA - 2 inhalations 1-2 time per day (empty lungs)  3. If needed:   A. Proventil HFA 2 puffs every 4-6 hours  B. OTC antihistamine  C. OTC Mucinex DM  D. Nasal saline  E. OTC Pataday-1 drop each eye 1 time per day  F. Astepro - 1-2 sprays each nostril 1-2 times per day  4.  Return to clinic in 6 months or earlier if problem  5. Obtain fall flu vaccine  6. Obtain home sleep study  Araina will remain on therapy for LPR and inflammation of her airway with the use of a proton pump inhibitor and a inhaled steroid.  We need to work through whether or not she still has a requirement for oxygen at nighttime.  She has very disrupted sleep and will obtain a home sleep study as she logistically cannot attend a sleep lab.  I will contact her with the results of that study once it is available for review.  Laurette Schimke, MD Allergy / Immunology [facility]

## 2022-02-18 ENCOUNTER — Encounter: Payer: Self-pay | Admitting: Allergy and Immunology

## 2022-02-19 ENCOUNTER — Telehealth: Payer: Self-pay

## 2022-02-19 NOTE — Telephone Encounter (Signed)
Faxed order for Home Sleep Study to [facility].

## 2022-03-21 ENCOUNTER — Encounter: Payer: Self-pay | Admitting: *Deleted

## 2022-04-04 ENCOUNTER — Ambulatory Visit: Payer: Medicare PPO | Admitting: Pulmonary Disease

## 2022-04-04 ENCOUNTER — Encounter: Payer: Self-pay | Admitting: Pulmonary Disease

## 2022-04-04 VITALS — BP 124/66 | HR 79 | Temp 98.2°F | Wt 167.0 lb

## 2022-04-04 DIAGNOSIS — R053 Chronic cough: Secondary | ICD-10-CM

## 2022-04-04 DIAGNOSIS — G4733 Obstructive sleep apnea (adult) (pediatric): Secondary | ICD-10-CM | POA: Diagnosis not present

## 2022-04-04 NOTE — Progress Notes (Signed)
@Patient  ID: , female    DOB: 1940/12/23, 81 y.o.   MRN: [id number]  Chief Complaint  Patient presents with   Follow-up    Pt is here for follow up. Pt states she does not think she has asthma. Pt states that all she has is a cough. Pt does have a little bit of Tussionex left. Pt is on Asmanex HFA from allergy doctor. Pt also takes robitussin as well.     Referring provider: [id number], MD  HPI:   81 y.o. woman whom we are seeing in follow up  for evaluation of acute hypoxemic respiratory failure and DOE felt largely due to hypervolemia related to CHF worsened in atrial arrhythmia.  Most recent PCP  note reviewed.  Most recent allergy note reviewed.  Overall doing okay.  Cough unchanged.  Dyspnea of the same.  No longer being hypoxemic during the day which is stable since last visit.  Had recent sleep test ordered by her allergist..  Was told to discuss with me.  This demonstrates mild to moderate sleep apnea with AHI 30.  Long discussion regarding potential therapies which she is leery of.  At the end of the visit she agrees to try CPAP.  HPI at initial visit: Patient with few months of gradual dyspnea.  Issue with chronic cough for decades.  Was seen in her allergist and noted to be incidentally hypoxemic.  Oxygen was ordered for her.  Work-up included CT high-res on my review interpretation shows bilateral pleural effusions with streaky opacity in the bases favored to represent atelectasis.  Read concern for possible ILD.  Her diuretic was increased from Lasix 20 mg every Monday Wednesday Friday to 20 mg daily.  She reports 8 pound weight loss in that week.  Her dyspnea has improved.  She walked in the clinic today and oxygen saturation 96% on room air.  She does at home without oxygen over the last few days her oxygen saturation has not dropped below 88%.  We discussed at length her imaging findings which to me is most consistent with volume overload.  Improvement in  symptoms and hypoxemia with diuresis fits with this.  Fibrosis from any kind will not improve with diuresis alone.  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, seasonal allergies, asthma Surgical history: Adenoidectomy/tonsillectomy, hernia repair Family history: Mother with hypertension, atrial fibrillation, father with hypertension, CAD Social history: Never smoker, lives in [facility]   Questionaires / Pulmonary Flowsheets:   ACT:  Asthma Control Test ACT Total Score  07/15/2017  3:00 PM 23  02/18/2017  3:00 PM 24    MMRC:     No data to display           Epworth:      No data to display           Tests:   FENO:  No results found for: "NITRICOXIDE"  PFT:    Latest Ref Rng & Units 01/02/2022    9:45 AM  PFT Results  FVC-Pre L 1.99   FVC-Predicted Pre % 85   FVC-Post L 2.06   FVC-Predicted Post % 88   Pre FEV1/FVC % % 80   Post FEV1/FCV % % 82   FEV1-Pre L 1.58   FEV1-Predicted Pre % 92   FEV1-Post L 1.69   DLCO uncorrected ml/min/mmHg 10.84   DLCO UNC% % 61   DLCO corrected ml/min/mmHg 10.84   DLCO COR %Predicted % 61   DLVA Predicted % 79  TLC L 4.48   TLC % Predicted % 94   RV % Predicted % 72   Spirometry reviewed interpreted as normal, no bronchodilator response, lung volumes within normal limits, DLCO cannot be interpreted given lack of repeatability, lack of quality per ATS standards  WALK:      No data to display           Imaging: Personally reviewed and as per EMR discussion this note No results found.  Lab Results: Personally reviewed CBC    Component Value Date/Time   WBC 9.0 07/19/2021 1115   RBC 4.02 07/19/2021 1115   HGB 11.1 (L) 07/19/2021 1115   HCT 34.3 (L) 07/19/2021 1115   PLT 366 07/19/2021 1115   MCV 85.3 07/19/2021 1115   MCH 27.6 07/19/2021 1115   MCHC 32.4 07/19/2021 1115   RDW 13.8 07/19/2021 1115   LYMPHSABS 0.7 07/19/2021 1115   MONOABS 0.7 07/19/2021 1115   EOSABS 0.1  07/19/2021 1115   BASOSABS 0.1 07/19/2021 1115    BMET    Component Value Date/Time   NA 141 09/19/2021 1117   K 4.4 09/19/2021 1117   CL 100 09/19/2021 1117   CO2 24 09/19/2021 1117   GLUCOSE 92 09/19/2021 1117   GLUCOSE 98 07/30/2021 1011   BUN 18 09/19/2021 1117   CREATININE 0.85 09/19/2021 1117   CALCIUM 9.4 09/19/2021 1117   GFRNONAA >60 07/19/2021 1115    BNP    Component Value Date/Time   BNP 187.4 (H) 07/19/2021 1115    ProBNP    Component Value Date/Time   PROBNP 381 09/19/2021 1117    Specialty Problems       Pulmonary Problems   Mild persistent asthma without complication   Bronchitis   Exertional dyspnea   Rhinosinusitis   Asthma    No Known Allergies  Immunization History  Administered Date(s) Administered   Fluad Quad(high Dose 65+) 05/09/2021   Influenza, High Dose Seasonal PF 04/29/2017, 04/15/2018, 04/25/2019, 05/11/2020   Influenza-Unspecified 05/22/2006, 06/09/2007, 04/15/2008   PFIZER(Purple Top)SARS-COV-2 Vaccination 07/27/2019, 08/17/2019   Pneumococcal Conjugate-13 01/16/2018   Pneumococcal Polysaccharide-23 04/25/2019   Tdap 02/16/2007    Past Medical History:  Diagnosis Date   A-fib (HCC)    Asthma    Blepharitis of upper and lower eyelids of both eyes 07/19/2019   Capsulitis of metatarsophalangeal (MTP) joint of left foot 07/01/2016   Dependent edema 06/20/2019   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/19/2019   Environmental and seasonal allergies 04/15/2018   Essential hypertension 04/15/2018   Exertional dyspnea 06/20/2019   GERD (gastroesophageal reflux disease)    GERD without esophagitis 04/15/2018   High blood pressure    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   Mild persistent asthma without complication 04/15/2018   Peripheral drusen, bilateral 07/19/2019   Postmenopausal bleeding 08/23/2015   Rhinosinusitis 01/11/2020   Screening for colon cancer 08/15/2019   Tachycardia 01/24/2021    TMJ tenderness, right 08/15/2019    Tobacco History: Social History   Tobacco Use  Smoking Status Never   Passive exposure: Never  Smokeless Tobacco Never   Counseling given: Not Answered   Continue to not smoke  Outpatient Encounter Medications as of 04/04/2022  Medication Sig   albuterol (PROVENTIL HFA) 108 (90 Base) MCG/ACT inhaler Inhale two puffs every four to six hours as needed for cough or wheeze.   apixaban (ELIQUIS) 5 MG TABS tablet Take 1 tablet (5 mg total) by mouth in the morning and  at bedtime.   ASMANEX HFA 200 MCG/ACT AERO Inhale 2 puffs into the lungs 2 (two) times daily.   Azelastine HCl (ASTEPRO) 0.15 % SOLN Place 1 spray into both nostrils at bedtime.   BIOTIN FORTE PO Take 1 tablet by mouth daily.   budesonide (RHINOCORT AQUA) 32 MCG/ACT nasal spray Place 1 spray into both nostrils daily.   calcium-vitamin D (OSCAL WITH D) 500-200 MG-UNIT tablet Take 1 tablet by mouth daily.   chlorpheniramine-HYDROcodone 10-8 MG/5ML Take 5 mLs by mouth at bedtime as needed for cough.   DEXILANT 60 MG capsule Take 60 mg by mouth in the morning.    diltiazem (DILACOR XR) 120 MG 24 hr capsule Take 1 capsule (120 mg total) by mouth daily.   dronedarone (MULTAQ) 400 MG tablet Take 1 tablet (400 mg total) by mouth 2 (two) times daily with a meal.   furosemide (LASIX) 20 MG tablet Take 1 tablet (20 mg total) by mouth daily.   Melatonin 10 MG CAPS Take 10 mg by mouth at bedtime.   Multiple Vitamin (MULTI-VITAMIN DAILY PO) Take 1 tablet by mouth daily.   potassium chloride (MICRO-K) 10 MEQ CR capsule Take 1 capsule (10 mEq total) by mouth daily.   telmisartan (MICARDIS) 40 MG tablet Take 1 tablet (40 mg total) by mouth daily.   Phenyleph-Pyril-DM-APAP (ROBITUSSIN NIGHT RELIEF PO) Take by mouth. (Patient not taking: Reported on 04/04/2022)   No facility-administered encounter medications on file as of 04/04/2022.     Review of Systems  Review of Systems  N/a Physical Exam  BP  124/66 (BP Location: Left Arm, Patient Position: Sitting, Cuff Size: Normal)   Pulse 79   Temp 98.2 F (36.8 C) (Oral)   Wt 167 lb (75.8 kg)   SpO2 100%   BMI 31.04 kg/m   Wt Readings from Last 5 Encounters:  04/04/22 167 lb (75.8 kg)  02/17/22 162 lb 9.6 oz (73.8 kg)  01/02/22 159 lb 12.8 oz (72.5 kg)  12/26/21 159 lb 6.4 oz (72.3 kg)  11/04/21 165 lb 3.2 oz (74.9 kg)    BMI Readings from Last 5 Encounters:  04/04/22 31.04 kg/m  02/17/22 30.23 kg/m  01/02/22 28.76 kg/m  12/26/21 29.15 kg/m  11/04/21 30.22 kg/m     Physical Exam General: well appearing, in NAD Eyes: EOMI, no icterus Neck: supple, no JVP Pulm: Normal work of breathing, clear CV: Irregular, no murmur Abd: ND, BS present MSK: No synovitis, No joint effusion Neuro: Normal gait, no weakness Psych: Normal mood, full affect  Assessment & Plan:   Nocturnal hypoxemia: Noted by allergist 06/2021 in the office.  She has chronic pleural effusions dating back to at least 01/2021.  CT high-res 07/19/2021 consistent with volume overload. Able to wean off oxygen in interim with increased diuresis.  Using only at night.  Obstructive sleep apnea: On recent polysomnography at home.  CPAP ordered today, auto titrate 5-15 cm H2O.  She will need to schedule follow-up with me to assess adherence, improvement on CPAP therapy.   Chronic cough: Present since the 1990s.  Suspect will be exceedingly difficult to treat given duration of symptoms.  Ongoing work-up and treatment per allergist. Not improved on ICS.  She has small supply of Tussionex to be used at night only as needed.   Return in about 6 months (around 10/03/2022).   Karren Burly, MD 04/04/2022

## 2022-04-04 NOTE — Patient Instructions (Signed)
Nice to see you again  We will order CPAP for you.  Once you get the machine, please make an appointment between 30 and 90 days after receiving the machine so we can go over the report to see if it is helping you or not.  The goal is to wear this at least 4 hours a night every night.  The longer you wear at the better.  The first few nights may be tough and you may not be able to get to 4 hours but keep trying to wear it as long as possible.  See if you can increase every day.  Return to clinic in 6 months or sooner as needed, this is just a placeholder in case CPAP is delayed, please call and schedule a sooner appointment after CPAP is delivered.

## 2022-04-04 NOTE — Addendum Note (Signed)
Addended by: Lavetta Nielsen L on: 04/04/2022 04:21 PM   Modules accepted: Orders

## 2022-05-14 ENCOUNTER — Ambulatory Visit: Payer: Medicare PPO | Attending: Cardiology | Admitting: Cardiology

## 2022-05-14 ENCOUNTER — Encounter: Payer: Self-pay | Admitting: Cardiology

## 2022-05-14 VITALS — BP 130/64 | HR 76 | Ht 61.5 in | Wt 168.0 lb

## 2022-05-14 DIAGNOSIS — I110 Hypertensive heart disease with heart failure: Secondary | ICD-10-CM | POA: Diagnosis not present

## 2022-05-14 DIAGNOSIS — I484 Atypical atrial flutter: Secondary | ICD-10-CM | POA: Diagnosis not present

## 2022-05-14 DIAGNOSIS — Z79899 Other long term (current) drug therapy: Secondary | ICD-10-CM

## 2022-05-14 DIAGNOSIS — Z7901 Long term (current) use of anticoagulants: Secondary | ICD-10-CM

## 2022-05-14 NOTE — Patient Instructions (Signed)
Medication Instructions:  Your physician has recommended you make the following change in your medication:   Take Furosemide twice daily for 2 days. Weigh your self daily in the morning after using the bathroom and record.  *If you need a refill on your cardiac medications before your next appointment, please call your pharmacy*   Lab Work: Your physician recommends that you have a CMP and ProBNP today.  If you have labs (blood work) drawn today and your tests are completely normal, you will receive your results only by: [place] (if you have MyChart) OR A paper copy in the mail If you have any lab test that is abnormal or we need to change your treatment, we will call you to review the results.   Testing/Procedures: None ordered   Follow-Up: At [facility], you and your health needs are our priority.  As part of our continuing mission to provide you with exceptional heart care, we have created designated Provider Care Teams.  These Care Teams include your primary Cardiologist (physician) and Advanced Practice Providers (APPs -  Physician Assistants and Nurse Practitioners) who all work together to provide you with the care you need, when you need it.  We recommend signing up for the patient portal called "MyChart".  Sign up information is provided on this After Visit Summary.  MyChart is used to connect with patients for Virtual Visits (Telemedicine).  Patients are able to view lab/test results, encounter notes, upcoming appointments, etc.  Non-urgent messages can be sent to your provider as well.   To learn more about what you can do with MyChart, go to [email or web address].    Your next appointment:   6 month(s)  The format for your next appointment:   In Person  Provider:   Shirlee More, MD   Other Instructions NA

## 2022-05-14 NOTE — Progress Notes (Signed)
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 Atypical atrial flutter (HCC)   3. Medication management   4. Chronic anticoagulation   5. High risk medication use    PLAN:    In order of problems listed above:  She clearly is changed compared to her baseline and I suspect there is an element of decompensated heart failure previously her proBNP level had been elevated and I am going to have her double her diuretic for 2 days recheck labs including a proBNP and see if we can get her back to her previous level of function.  I asked her to get back to self-management weighing and recording her blood pressure daily. Continue to maintain sinus rhythm on dronedarone recheck CMP. Continue her anticoagulant   Next appointment: 6 months   Medication Adjustments/Labs and Tests Ordered: Current medicines are reviewed at length with the patient today.  Concerns regarding medicines are outlined above.  Orders Placed This Encounter  Procedures   Comprehensive metabolic panel   Pro b natriuretic peptide (BNP)   EKG 12-Lead   No orders of the defined types were placed in this encounter.   Chief Complaint  Patient presents with   Follow-up   Atrial Flutter   Congestive Heart Failure    History of Present Illness:    Karen Snyder is a 81 y.o. female with a hx of atrial flutter previously suppressed on amiodarone but discontinued with concern of pulmonary injury chronic anticoagulation hypertensive heart disease with heart failure and hypoxemia improved with discontinuation of amiodarone last seen 12/26/2021 following [facility] admission with respiratory failure and parainfluenza pneumonia.  Compliance with diet, lifestyle and medications: Yes  She  recently had Moderna booster and since then has not done as well she had been off of oxygen now is using it during the day and finds himself short of breath which had resolved. She had travel to the beach she missed 1 day of her diuretic but also she has stopped weighing herself. She has had no palpitations or syncope. She had a brief episode of hematuria that is has resolved.  She continues her anticoagulant Past Medical History:  Diagnosis Date   A-fib (HCC)    Asthma    Blepharitis of upper and lower eyelids of both eyes 07/19/2019   Capsulitis of metatarsophalangeal (MTP) joint of left foot 07/01/2016   Dependent edema 06/20/2019   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/19/2019   Environmental and seasonal allergies 04/15/2018   Essential hypertension 04/15/2018   Exertional dyspnea 06/20/2019   GERD (gastroesophageal reflux disease)    GERD without esophagitis 04/15/2018   High blood pressure    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   Mild persistent asthma without complication 04/15/2018   Peripheral drusen, bilateral 07/19/2019   Postmenopausal bleeding 08/23/2015   Rhinosinusitis 01/11/2020   Screening for colon cancer 08/15/2019   Tachycardia 01/24/2021   TMJ tenderness, right 08/15/2019    Past Surgical History:  Procedure Laterality Date   ADENOIDECTOMY  1946   HERNIA REPAIR  2016   TONSILLECTOMY  1946    Current Medications: Current Meds  Medication Sig   albuterol (PROVENTIL HFA) 108 (90 Base) MCG/ACT  inhaler Inhale two puffs every four to six hours as needed for cough or wheeze.   apixaban (ELIQUIS) 5 MG TABS tablet Take 1 tablet (5 mg total) by mouth in the morning and at bedtime.   ASMANEX HFA 200 MCG/ACT AERO Inhale 2 puffs into the lungs 2 (two) times daily.   Azelastine HCl (ASTEPRO) 0.15 % SOLN Place 1 spray into both nostrils at bedtime.   BIOTIN FORTE PO Take 1 tablet by mouth daily.   calcium-vitamin D (OSCAL WITH D) 500-200  MG-UNIT tablet Take 1 tablet by mouth daily.   chlorpheniramine-HYDROcodone 10-8 MG/5ML Take 5 mLs by mouth at bedtime as needed for cough.   DEXILANT 60 MG capsule Take 60 mg by mouth in the morning.    diltiazem (DILACOR XR) 120 MG 24 hr capsule Take 1 capsule (120 mg total) by mouth daily.   dronedarone (MULTAQ) 400 MG tablet Take 1 tablet (400 mg total) by mouth 2 (two) times daily with a meal.   furosemide (LASIX) 20 MG tablet Take 1 tablet (20 mg total) by mouth daily.   Melatonin 10 MG CAPS Take 10 mg by mouth at bedtime.   Multiple Vitamin (MULTI-VITAMIN DAILY PO) Take 1 tablet by mouth daily.   potassium chloride (MICRO-K) 10 MEQ CR capsule Take 1 capsule (10 mEq total) by mouth daily.   telmisartan (MICARDIS) 40 MG tablet Take 1 tablet (40 mg total) by mouth daily.     Allergies:   Patient has no known allergies.   Social History   Socioeconomic History   Marital status: Married    Spouse name: Not on file   Number of children: Not on file   Years of education: Not on file   Highest education level: Not on file  Occupational History   Not on file  Tobacco Use   Smoking status: Never    Passive exposure: Never   Smokeless tobacco: Never  Vaping Use   Vaping Use: Never used  Substance and Sexual Activity   Alcohol use: No   Drug use: No   Sexual activity: Not on file  Other Topics Concern   Not on file  Social History Narrative   Not on file   Social Determinants of Health   Financial Resource Strain: Not on file  Food Insecurity: Not on file  Transportation Needs: Not on file  Physical Activity: Not on file  Stress: Not on file  Social Connections: Not on file     Family History: The patient's family history includes Atrial fibrillation in her mother; Eczema in her sister; Heart Problems in her maternal grandfather and maternal grandmother; Heart attack in her father; Hypertension in her father and mother. ROS:   Please see the history of present illness.     All other systems reviewed and are negative.  EKGs/Labs/Other Studies Reviewed:    The following studies were reviewed today:  EKG:  EKG ordered today and personally reviewed.  The ekg ordered today demonstrates sinus rhythm minor ST-T abnormality  Recent Labs: 07/19/2021: B Natriuretic Peptide 187.4; Hemoglobin 11.1; Platelets 366 09/19/2021: ALT 14; BUN 18; Creatinine, Ser 0.85; NT-Pro BNP 381; Potassium 4.4; Sodium 141  Recent Lipid Panel No results found for: "CHOL", "TRIG", "HDL", "CHOLHDL", "VLDL", "LDLCALC", "LDLDIRECT"  Physical Exam:    VS:  BP 130/64 (BP Location: Left Arm, Patient Position: Sitting, Cuff Size: Normal)   Pulse 76   Ht 5' 1.5" (1.562 m)   Wt 168 lb (76.2 kg)   SpO2 93%  BMI 31.23 kg/m     Wt Readings from Last 3 Encounters:  05/14/22 168 lb (76.2 kg)  04/04/22 167 lb (75.8 kg)  02/17/22 162 lb 9.6 oz (73.8 kg)     GEN: She is anxious wearing nasal oxygen well nourished, well developed in no acute distress HEENT: Normal NECK: No JVD; No carotid bruits LYMPHATICS: No lymphadenopathy CARDIAC: RRR, no murmurs, rubs, gallops RESPIRATORY:  Clear to auscultation without rales, wheezing or rhonchi  ABDOMEN: Soft, non-tender, non-distended MUSCULOSKELETAL: She has 1+ bilateral lower extremity ankle to knee pitting edema; No deformity  SKIN: Warm and dry NEUROLOGIC:  Alert and oriented x 3 PSYCHIATRIC:  Normal affect    Signed, Norman Herrlich, MD  05/14/2022 4:57 PM    [facility]

## 2022-05-15 LAB — COMPREHENSIVE METABOLIC PANEL
ALT: 8 IU/L (ref 0–32)
AST: 14 IU/L (ref 0–40)
Albumin/Globulin Ratio: 1.5 (ref 1.2–2.2)
Albumin: 4.1 g/dL (ref 3.7–4.7)
Alkaline Phosphatase: 59 IU/L (ref 44–121)
BUN/Creatinine Ratio: 19 (ref 12–28)
BUN: 20 mg/dL (ref 8–27)
Bilirubin Total: 0.5 mg/dL (ref 0.0–1.2)
CO2: 24 mmol/L (ref 20–29)
Calcium: 8.9 mg/dL (ref 8.7–10.3)
Chloride: 105 mmol/L (ref 96–106)
Creatinine, Ser: 1.03 mg/dL — ABNORMAL HIGH (ref 0.57–1.00)
Globulin, Total: 2.7 g/dL (ref 1.5–4.5)
Glucose: 95 mg/dL (ref 70–99)
Potassium: 3.6 mmol/L (ref 3.5–5.2)
Sodium: 144 mmol/L (ref 134–144)
Total Protein: 6.8 g/dL (ref 6.0–8.5)
eGFR: 55 mL/min/{1.73_m2} — ABNORMAL LOW (ref >59)

## 2022-05-15 LAB — PRO B NATRIURETIC PEPTIDE: NT-Pro BNP: 493 pg/mL (ref 0–738)

## 2022-05-21 ENCOUNTER — Other Ambulatory Visit: Payer: Self-pay | Admitting: Cardiology

## 2022-06-19 ENCOUNTER — Ambulatory Visit: Payer: Medicare PPO | Admitting: Pulmonary Disease

## 2022-06-19 ENCOUNTER — Encounter: Payer: Self-pay | Admitting: Pulmonary Disease

## 2022-06-19 ENCOUNTER — Ambulatory Visit (INDEPENDENT_AMBULATORY_CARE_PROVIDER_SITE_OTHER): Payer: Medicare PPO

## 2022-06-19 ENCOUNTER — Telehealth: Payer: Self-pay | Admitting: Pulmonary Disease

## 2022-06-19 VITALS — BP 124/64 | HR 80 | Ht 62.0 in | Wt 167.8 lb

## 2022-06-19 DIAGNOSIS — R0902 Hypoxemia: Secondary | ICD-10-CM | POA: Diagnosis not present

## 2022-06-19 DIAGNOSIS — G4733 Obstructive sleep apnea (adult) (pediatric): Secondary | ICD-10-CM | POA: Diagnosis not present

## 2022-06-19 DIAGNOSIS — R053 Chronic cough: Secondary | ICD-10-CM | POA: Diagnosis not present

## 2022-06-19 MED ORDER — HYDROCOD POLI-CHLORPHE POLI ER 10-8 MG/5ML PO SUER: 5.0000 mL | Freq: Every evening | ORAL | 0 refills | Status: DC | PRN

## 2022-06-19 MED ORDER — RAMELTEON 8 MG PO TABS: 8.0000 mg | ORAL_TABLET | Freq: Every day | ORAL | 11 refills | Status: DC

## 2022-06-19 NOTE — Progress Notes (Signed)
@Patient  ID: , female    DOB: [birth year], 81 y.o.   MRN: [id number]  Chief Complaint  Patient presents with   Follow-up    Pt is here fro cpap compliance. I could not get a download but I will call [employer]. Pt states she been wearing it everynight for 4-6 hours. Pt states it is taking some time to get use to it. She wears full face mask due to being mouth breather.     Referring provider: [id number], MD  HPI:   81 y.o. woman whom we are seeing in follow up  for evaluation of acute hypoxemic respiratory failure and DOE felt largely due to hypervolemia related to CHF worsened in atrial arrhythmia now following up after newly diagnosed OSA.  Most recent cardiology note reviewed.  Overall doing okay.  Cough unchanged.  Dyspnea the same.  She notes her oxygen saturation has been dropping intermittently.  She notes it will be high to mid 80s and then a few seconds later when walking around into the mid 90s.  Today she ambulated in clinic on room air 98%.  Checked later on her monitor and 100% while sitting there.  We discussed that likely the desaturations are not true desaturations but rather poor signal or data from the monitor.  Especially with lack of symptoms.  We again discussed the only explanation for her hypoxemia in the past has been volume overload.  We agreed for repeat chest x-ray today.  She has been using her CPAP.  Average 5 to 6 hours a night every night for the last 30 days.  AHI 1.5.  She had issues with fit, does not like the way the mask is at times.  She is still having a lot of insomnia.  Difficulty falling asleep.    HPI at initial visit: Patient with few months of gradual dyspnea.  Issue with chronic cough for decades.  Was seen in her allergist and noted to be incidentally hypoxemic.  Oxygen was ordered for her.  Work-up included CT high-res on my review interpretation shows bilateral pleural effusions with streaky opacity in the bases favored to  represent atelectasis.  Read concern for possible ILD.  Her diuretic was increased from Lasix 20 mg every Monday Wednesday Friday to 20 mg daily.  She reports 8 pound weight loss in that week.  Her dyspnea has improved.  She walked in the clinic today and oxygen saturation 96% on room air.  She does at home without oxygen over the last few days her oxygen saturation has not dropped below 88%.  We discussed at length her imaging findings which to me is most consistent with volume overload.  Improvement in symptoms and hypoxemia with diuresis fits with this.  Fibrosis from any kind will not improve with diuresis alone.  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, seasonal allergies, asthma Surgical history: Adenoidectomy/tonsillectomy, hernia repair Family history: Mother with hypertension, atrial fibrillation, father with hypertension, CAD Social history: Never smoker, lives in [facility] / Pulmonary Flowsheets:   ACT:  Asthma Control Test ACT Total Score  07/15/2017  3:00 PM 23  02/18/2017  3:00 PM 24    MMRC:     No data to display           Epworth:      No data to display           Tests:   FENO:  No results found for: "NITRICOXIDE"  PFT:  Latest Ref Rng & Units 01/02/2022    9:45 AM  PFT Results  FVC-Pre L 1.99   FVC-Predicted Pre % 85   FVC-Post L 2.06   FVC-Predicted Post % 88   Pre FEV1/FVC % % 80   Post FEV1/FCV % % 82   FEV1-Pre L 1.58   FEV1-Predicted Pre % 92   FEV1-Post L 1.69   DLCO uncorrected ml/min/mmHg 10.84   DLCO UNC% % 61   DLCO corrected ml/min/mmHg 10.84   DLCO COR %Predicted % 61   DLVA Predicted % 79   TLC L 4.48   TLC % Predicted % 94   RV % Predicted % 72   Spirometry reviewed interpreted as normal, no bronchodilator response, lung volumes within normal limits, DLCO cannot be interpreted given lack of repeatability, lack of quality per ATS standards  WALK:      No data to display            Imaging: Personally reviewed and as per EMR discussion this note No results found.  Lab Results: Personally reviewed CBC    Component Value Date/Time   WBC 9.0 07/19/2021 1115   RBC 4.02 07/19/2021 1115   HGB 11.1 (L) 07/19/2021 1115   HCT 34.3 (L) 07/19/2021 1115   PLT 366 07/19/2021 1115   MCV 85.3 07/19/2021 1115   MCH 27.6 07/19/2021 1115   MCHC 32.4 07/19/2021 1115   RDW 13.8 07/19/2021 1115   LYMPHSABS 0.7 07/19/2021 1115   MONOABS 0.7 07/19/2021 1115   EOSABS 0.1 07/19/2021 1115   BASOSABS 0.1 07/19/2021 1115    BMET    Component Value Date/Time   NA 144 05/14/2022 1612   K 3.6 05/14/2022 1612   CL 105 05/14/2022 1612   CO2 24 05/14/2022 1612   GLUCOSE 95 05/14/2022 1612   GLUCOSE 98 07/30/2021 1011   BUN 20 05/14/2022 1612   CREATININE 1.03 (H) 05/14/2022 1612   CALCIUM 8.9 05/14/2022 1612   GFRNONAA >60 07/19/2021 1115    BNP    Component Value Date/Time   BNP 187.4 (H) 07/19/2021 1115    ProBNP    Component Value Date/Time   PROBNP 493 05/14/2022 1612    Specialty Problems       Pulmonary Problems   Mild persistent asthma without complication   Bronchitis   Exertional dyspnea   Rhinosinusitis   Asthma    No Known Allergies  Immunization History  Administered Date(s) Administered   Fluad Quad(high Dose 65+) 05/09/2021   Influenza, High Dose Seasonal PF 04/29/2017, 04/15/2018, 04/25/2019, 05/11/2020   Influenza-Unspecified 05/22/2006, 06/09/2007, 04/15/2008   PFIZER(Purple Top)SARS-COV-2 Vaccination 07/27/2019, 08/17/2019   Pneumococcal Conjugate-13 01/16/2018   Pneumococcal Polysaccharide-23 04/25/2019   Tdap 02/16/2007    Past Medical History:  Diagnosis Date   A-fib (HCC)    Asthma    Blepharitis of upper and lower eyelids of both eyes 07/19/2019   Capsulitis of metatarsophalangeal (MTP) joint of left foot 07/01/2016   Dependent edema 06/20/2019   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/19/2019   Environmental  and seasonal allergies 04/15/2018   Essential hypertension 04/15/2018   Exertional dyspnea 06/20/2019   GERD (gastroesophageal reflux disease)    GERD without esophagitis 04/15/2018   High blood pressure    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   Mild persistent asthma without complication 04/15/2018   Peripheral drusen, bilateral 07/19/2019   Postmenopausal bleeding 08/23/2015   Rhinosinusitis 01/11/2020   Screening for colon cancer 08/15/2019  Tachycardia 01/24/2021   TMJ tenderness, right 08/15/2019    Tobacco History: Social History   Tobacco Use  Smoking Status Never   Passive exposure: Never  Smokeless Tobacco Never   Counseling given: Not Answered   Continue to not smoke  Outpatient Encounter Medications as of 06/19/2022  Medication Sig   albuterol (PROVENTIL HFA) 108 (90 Base) MCG/ACT inhaler Inhale two puffs every four to six hours as needed for cough or wheeze.   apixaban (ELIQUIS) 5 MG TABS tablet Take 1 tablet (5 mg total) by mouth in the morning and at bedtime.   ASMANEX HFA 200 MCG/ACT AERO Inhale 2 puffs into the lungs 2 (two) times daily.   Azelastine HCl (ASTEPRO) 0.15 % SOLN Place 1 spray into both nostrils at bedtime.   BIOTIN FORTE PO Take 1 tablet by mouth daily.   calcium-vitamin D (OSCAL WITH D) 500-200 MG-UNIT tablet Take 1 tablet by mouth daily.   DEXILANT 60 MG capsule Take 60 mg by mouth in the morning.    diltiazem (DILACOR XR) 120 MG 24 hr capsule Take 1 capsule (120 mg total) by mouth daily.   dronedarone (MULTAQ) 400 MG tablet Take 1 tablet (400 mg total) by mouth 2 (two) times daily with a meal.   furosemide (LASIX) 20 MG tablet Take 1 tablet (20 mg total) by mouth daily.   Multiple Vitamin (MULTI-VITAMIN DAILY PO) Take 1 tablet by mouth daily.   potassium chloride (MICRO-K) 10 MEQ CR capsule Take 1 capsule (10 mEq total) by mouth daily.   ramelteon (ROZEREM) 8 MG tablet Take 1 tablet (8 mg total) by mouth at bedtime.    telmisartan (MICARDIS) 40 MG tablet Take 1 tablet (40 mg total) by mouth daily.   [DISCONTINUED] chlorpheniramine-HYDROcodone 10-8 MG/5ML Take 5 mLs by mouth at bedtime as needed for cough.   [DISCONTINUED] Melatonin 10 MG CAPS Take 10 mg by mouth at bedtime.   chlorpheniramine-HYDROcodone (TUSSIONEX) 10-8 MG/5ML Take 5 mLs by mouth at bedtime as needed for cough.   No facility-administered encounter medications on file as of 06/19/2022.     Review of Systems  Review of Systems  N/a Physical Exam  BP 124/64 (BP Location: Left Arm, Patient Position: Sitting, Cuff Size: Normal)   Pulse 80   Ht 5\' 2"  (1.575 m)   Wt 167 lb 12.8 oz (76.1 kg)   SpO2 98%   BMI 30.69 kg/m   Wt Readings from Last 5 Encounters:  06/19/22 167 lb 12.8 oz (76.1 kg)  05/14/22 168 lb (76.2 kg)  04/04/22 167 lb (75.8 kg)  02/17/22 162 lb 9.6 oz (73.8 kg)  01/02/22 159 lb 12.8 oz (72.5 kg)    BMI Readings from Last 5 Encounters:  06/19/22 30.69 kg/m  05/14/22 31.23 kg/m  04/04/22 31.04 kg/m  02/17/22 30.23 kg/m  01/02/22 28.76 kg/m     Physical Exam General: well appearing, in NAD Eyes: EOMI, no icterus Neck: supple, no JVP Pulm: Normal work of breathing, diminished sounds of bilateral bases, right greater than left CV: Irregular, no murmur Abd: ND, BS present MSK: No synovitis, No joint effusion Neuro: Normal gait, no weakness Psych: Normal mood, full affect  Assessment & Plan:   Nocturnal hypoxemia: Noted by allergist 06/2021 in the office.  She has chronic pleural effusions dating back to at least 01/2021.  CT high-res 07/19/2021 consistent with volume overload. Able to wean off oxygen in interim with increased diuresis.  Using only at night.  Recent desaturations noted feel are not  true desaturations but rather poor signal leading to falsely low readings on her home monitor.  She is 98% after ambulating into the room today.  100% on room air when sitting in the chair.  We will repeat chest  x-ray.  In the past, only explanation has been volume overload.  She has chronic effusions on most recent chest x-ray 07/2021, right greater than left on my review and interpretation.  Obstructive sleep apnea: On recent polysomnography at home.  She is using CPAP, auto titrating.  AHI 1.5.  Using 5 to 6 hours a night over the last 30 days.  She is benefiting from this.  To continue.  Ongoing mask fitting.  Chronic cough: Present since the 1990s.  Suspect will be exceedingly difficult to treat given duration of symptoms.  Ongoing work-up and treatment per allergist. Not improved on ICS.  She has small supply of Tussionex to be used at night only as needed.  Most likely reflux related given her hiatal hernia on imaging.  Especially with lack of response to other therapies.     Return in about 3 months (around 09/18/2022).   Karren Burly, MD 06/19/2022  I spent 41 minutes in the care of the patient including face-to-face visit, review of records, coordination of care.

## 2022-06-19 NOTE — Telephone Encounter (Signed)
Dr Judeth Horn please note message from pharmacy:  laura with [facility] states that they are unable to get the tussinex in stock at the moment and is unabke to transfer the rx due to it being a controlled med. states the pt would like the rx to be sent to [facility] [address] [place]

## 2022-06-19 NOTE — Telephone Encounter (Signed)
Script resent

## 2022-06-19 NOTE — Patient Instructions (Signed)
Nice to see you again  For the insomnia, take ramelteon 1 tablet at night.  Stop melatonin.  I refilled the cough syrup  Please get a chest x-ray today given your reports of the low oxygen recently.  Your oxygen today is great.  Continue trying to use the CPAP.  Return to clinic in 3 months or sooner as needed with Dr. Judeth Horn

## 2022-06-20 NOTE — Progress Notes (Signed)
CXR shows evidence of fluid build up - similar to prior chest xrays

## 2022-06-23 ENCOUNTER — Telehealth: Payer: Self-pay | Admitting: Pulmonary Disease

## 2022-06-24 ENCOUNTER — Other Ambulatory Visit (HOSPITAL_COMMUNITY): Payer: Self-pay

## 2022-06-24 ENCOUNTER — Telehealth: Payer: Self-pay

## 2022-06-24 NOTE — Telephone Encounter (Signed)
Called and spoke with patient. Patient verbalized understanding. Patient also stated that her insurance needed something from Dr. Judeth Horn for the ramelteon prescription that needed to be sent to her insurance. Prior auth team, could you check to see if she needs a prior authorization?

## 2022-06-24 NOTE — Telephone Encounter (Signed)
PA has been DENIED due to no notation of trial/failure of Belsomra or Trazodone. Please advise.

## 2022-06-24 NOTE — Telephone Encounter (Signed)
Trazadone 50 mg nightly

## 2022-06-24 NOTE — Telephone Encounter (Signed)
Dr Judeth Horn,   Please note message from pharmacy in regards to Ramelteon 8mg  tablet.  PA has been DENIED due to no notation of trial/failure of Belsomra or Trazodone. Please advise.   Please advise sir

## 2022-06-24 NOTE — Telephone Encounter (Signed)
PA has been started and will be updated on separate encounter.

## 2022-06-24 NOTE — Telephone Encounter (Signed)
Called and left patient a voicemail for her to call office back for recommendations from Dr Judeth Horn on her medication and also for her chest xray results.

## 2022-06-24 NOTE — Telephone Encounter (Signed)
PA request received through patient for Ramelteon 8MG  tablets.   PA started and submitted through [facility].  PA is awaiting determination.   Key: [facility] - PA Case ID: [id number]

## 2022-06-24 NOTE — Telephone Encounter (Signed)
Called and spoke with patient. Patient is aware of prior authorization being started.   Nothing further needed at this time.

## 2022-06-25 NOTE — Telephone Encounter (Signed)
Patient is returning a call.  Please call back to discuss at this cell number - [phone]

## 2022-06-26 MED ORDER — TRAZODONE HCL 50 MG PO TABS: 50.0000 mg | ORAL_TABLET | Freq: Every day | ORAL | 1 refills | Status: DC

## 2022-06-26 NOTE — Telephone Encounter (Signed)
Called and spoke to patient about her chest xray results and her medication that he is wanting to send in. Went over medication and directions. She verbalized understanding. Nothing further needed

## 2022-08-19 ENCOUNTER — Encounter: Payer: Self-pay | Admitting: Cardiology

## 2022-08-20 ENCOUNTER — Encounter: Payer: Self-pay | Admitting: Allergy and Immunology

## 2022-08-20 ENCOUNTER — Ambulatory Visit: Payer: Medicare PPO | Admitting: Allergy and Immunology

## 2022-08-20 VITALS — BP 124/60 | HR 76 | Resp 16 | Ht 61.5 in | Wt 162.4 lb

## 2022-08-20 DIAGNOSIS — J4530 Mild persistent asthma, uncomplicated: Secondary | ICD-10-CM | POA: Diagnosis not present

## 2022-08-20 DIAGNOSIS — J3089 Other allergic rhinitis: Secondary | ICD-10-CM | POA: Diagnosis not present

## 2022-08-20 DIAGNOSIS — K219 Gastro-esophageal reflux disease without esophagitis: Secondary | ICD-10-CM

## 2022-08-20 DIAGNOSIS — G4733 Obstructive sleep apnea (adult) (pediatric): Secondary | ICD-10-CM

## 2022-08-20 DIAGNOSIS — G4720 Circadian rhythm sleep disorder, unspecified type: Secondary | ICD-10-CM

## 2022-08-20 MED ORDER — ALPRAZOLAM 0.25 MG PO TABS: ORAL_TABLET | ORAL | 3 refills | Status: DC

## 2022-08-20 NOTE — Progress Notes (Unsigned)
[facility]   Follow-up Note  Referring Provider: Myrlene Broker, MD Primary Provider: Myrlene Broker, MD Date of Office Visit: 08/20/2022  Subjective:   Karen Snyder (DOB: [birth year]) is a 82 y.o. female who returns to the [facility] [place] [facility] on 08/20/2022 in re-evaluation of the following:  HPI: Karen Snyder returns to this clinic in evaluation of chronic cough with component of asthma, rhinitis, LPR, and history of hypoxemic respiratory failure secondary to systolic congestive heart failure and parainfluenza infection and true sleep apnea.  I last saw her in this clinic 17 February 2022.  During her last visit we maintain therapy with an inhaled steroid and Dexilant and arrange for a home sleep study to address her sleep dysfunction.  Her sleep study identified significant AHI and we have referred her to pulmonology for further management of this issue and she is now using a CPAP device.  Fortunately, she has very bad insomnia and she has been given some medications in the past which have not really helped this issue and she is relying on the use of Tussionex to suppress her cough and give her some benefit regarding her sleep dysfunction every night.  She uses 0.125 to 2.5 mL every night.  She states she cannot turn off her brain and that is the reason she cannot fall asleep.  She still continues to cough even in the face of utilizing therapy directed against reflux and some anti-inflammatory medications for her airway.  She believes that Tussionex is probably the only medication that has given rise to some of her cough in the past.  She follows up with her cardiologist, Dr. Bettina Gavia, concerning her heart status and hypoxemia.  She has oxygen at home and intermittently uses this agent but for the most part she has done much better with each passing month from her hypoxic event this past summer.  She has received a flu vaccine and COVID-vaccine.   She will not be receiving RSV vaccine.  Allergies as of 08/20/2022   No Known Allergies      Medication List    albuterol 108 (90 Base) MCG/ACT inhaler Commonly known as: Proventil HFA Inhale two puffs every four to six hours as needed for cough or wheeze.   apixaban 5 MG Tabs tablet Commonly known as: ELIQUIS Take 1 tablet (5 mg total) by mouth in the morning and at bedtime.   Asmanex HFA 200 MCG/ACT Aero Generic drug: Mometasone Furoate Inhale 2 puffs into the lungs 2 (two) times daily.   Astepro 0.15 % Soln Generic drug: Azelastine HCl Place 1 spray into both nostrils at bedtime.   BIOTIN FORTE PO Take 1 tablet by mouth daily.   calcium-vitamin D 500-200 MG-UNIT tablet Commonly known as: OSCAL WITH D Take 1 tablet by mouth daily.   chlorpheniramine-HYDROcodone 10-8 MG/5ML Commonly known as: TUSSIONEX Take 5 mLs by mouth at bedtime as needed for cough.   Dexilant 60 MG capsule Generic drug: dexlansoprazole Take 60 mg by mouth in the morning.   diltiazem 120 MG 24 hr capsule Commonly known as: DILACOR XR Take 1 capsule (120 mg total) by mouth daily.   furosemide 20 MG tablet Commonly known as: LASIX Take 1 tablet (20 mg total) by mouth daily.   Multaq 400 MG tablet Generic drug: dronedarone Take 1 tablet (400 mg total) by mouth 2 (two) times daily with a meal.   MULTI-VITAMIN DAILY PO Take 1 tablet by mouth daily.  potassium chloride 10 MEQ CR capsule Commonly known as: MICRO-K Take 1 capsule (10 mEq total) by mouth daily.   telmisartan 40 MG tablet Commonly known as: MICARDIS Take 1 tablet (40 mg total) by mouth daily.   traZODone 50 MG tablet Commonly known as: DESYREL Take 1 tablet (50 mg total) by mouth at bedtime.    Past Medical History:  Diagnosis Date   A-fib [facility])    Asthma    Blepharitis of upper and lower eyelids of both eyes 07/19/2019   Capsulitis of metatarsophalangeal (MTP) joint of left foot 07/01/2016   Dependent edema  06/20/2019   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/19/2019   Environmental and seasonal allergies 04/15/2018   Essential hypertension 04/15/2018   Exertional dyspnea 06/20/2019   GERD (gastroesophageal reflux disease)    GERD without esophagitis 04/15/2018   High blood pressure    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   Mild persistent asthma without complication 32/67/1245   Peripheral drusen, bilateral 07/19/2019   Postmenopausal bleeding 08/23/2015   Rhinosinusitis 01/11/2020   Screening for colon cancer 08/15/2019   Tachycardia 01/24/2021   TMJ tenderness, right 08/15/2019    Past Surgical History:  Procedure Laterality Date   ADENOIDECTOMY  1946   HERNIA REPAIR  2016   TONSILLECTOMY  1946    Review of systems negative except as noted in HPI / PMHx or noted below:  Review of Systems  Constitutional: Negative.   HENT: Negative.    Eyes: Negative.   Respiratory: Negative.    Cardiovascular: Negative.   Gastrointestinal: Negative.   Genitourinary: Negative.   Musculoskeletal: Negative.   Skin: Negative.   Neurological: Negative.   Endo/Heme/Allergies: Negative.   Psychiatric/Behavioral: Negative.       Objective:   Vitals:   08/20/22 1023  BP: 124/60  Pulse: 76  Resp: 16  SpO2: 96%   Height: 5' 1.5" (156.2 cm)  Weight: 162 lb 6.4 oz (73.7 kg)   Physical Exam Constitutional:      Appearance: She is not diaphoretic.  HENT:     Head: Normocephalic.     Right Ear: Tympanic membrane, ear canal and external ear normal.     Left Ear: Tympanic membrane, ear canal and external ear normal.     Nose: Nose normal. No mucosal edema or rhinorrhea.     Mouth/Throat:     Pharynx: Uvula midline. No oropharyngeal exudate.  Eyes:     Conjunctiva/sclera: Conjunctivae normal.  Neck:     Thyroid: No thyromegaly.     Trachea: Trachea normal. No tracheal tenderness or tracheal deviation.  Cardiovascular:     Rate and Rhythm: Normal rate and  regular rhythm.     Heart sounds: Normal heart sounds, S1 normal and S2 normal. No murmur heard. Pulmonary:     Effort: No respiratory distress.     Breath sounds: Normal breath sounds. No stridor. No wheezing or rales.  Lymphadenopathy:     Head:     Right side of head: No tonsillar adenopathy.     Left side of head: No tonsillar adenopathy.     Cervical: No cervical adenopathy.  Skin:    Findings: No erythema or rash.     Nails: There is no clubbing.  Neurological:     Mental Status: She is alert.     Diagnostics:    Spirometry was performed and demonstrated an FEV1 of 1.36 at 78 % of predicted.  Results of a chest x-ray obtained 19 June 2022 identified the following:  1. Interval small bilateral pleural effusions, right greater than left. 2. Mild right basilar and minimal left basilar atelectasis and possible pneumonia. 3. Stable cardiomegaly and moderately large hiatal hernia.  Results of a home sleep study obtained 06 March 2022 identified RDI = 32, oxygen saturation less than 88% for 1 hour and 17 minutes.  Assessment and Plan:   1. Asthma, well controlled, mild persistent   2. Other allergic rhinitis   3. LPRD (laryngopharyngeal reflux disease)   4. Obstructive sleep apnea syndrome   5. Dysfunction of sleep stage or arousal       1. Continue to Treat reflux:   A.  Minimize caffeine and chocolate consumption    B.  Dexilant 60 mg 1 time a day in a.m.   2. Continue to Treat inflammation:   A.  Asmanex 200 HFA - 2 inhalations 1-2 time per day (empty lungs)  3. If needed:   A. Proventil HFA 2 puffs every 4-6 hours  B. OTC antihistamine  C. OTC Mucinex DM  D. Nasal saline  E. OTC Pataday-1 drop each eye 1 time per day  F. Astepro - 1-2 sprays each nostril 1-2 times per day  4. Do not use Tussionex long term  5. Can try alprazolam 0.25 mg before CPAP use.  6. Further treatment for sleep issue??? Side effect from alprazolam???  7. Contact clinic  next week concerning response to alprazolam  8. Return to clinic in 6 months or earlier if problem  Iolani is doing okay concerning her chronic cough on her current plan and she will remain on Dexilant and Asmanex and she has a selection of other agents to be utilized should they be required.  She has a problem with her sleep apnea and her nocturnal hypoxemia and using her CPAP machine because she cannot turn off her brain and she cannot fall asleep.  She is relying on the use of Tussionex at nighttime to help with these issues but is not really working.  I will give her a low-dose of alprazolam to use at night with a prolonged discussion about some of the side effects of using alprazolam completed today.  Hopefully this is going to allow her to sleep and wear her CPAP and she wont have any side effects from the use of this medicine.  She will contact us next week regarding her response to this approach.  If she does okay I will see her back in this clinic in 6 months or earlier if there is a problem.   Allena Katz, MD Allergy / Immunology [place]

## 2022-08-20 NOTE — Patient Instructions (Addendum)
  1. Continue to Treat reflux:   A.  Minimize caffeine and chocolate consumption    B.  Dexilant 60 mg 1 time a day in a.m.   2. Continue to Treat inflammation:   A.  Asmanex 200 HFA - 2 inhalations 1-2 time per day (empty lungs)  3. If needed:   A. Proventil HFA 2 puffs every 4-6 hours  B. OTC antihistamine  C. OTC Mucinex DM  D. Nasal saline  E. OTC Pataday-1 drop each eye 1 time per day  F. Astepro - 1-2 sprays each nostril 1-2 times per day  4. Do not use Tussionex long term  5. Can try alprazolam 0.25 mg before CPAP use.  6. Further treatment for sleep issue??? Side effect from alprazolam???  7. Contact clinic next week concerning response to alprazolam  8. Return to clinic in 6 months or earlier if problem

## 2022-08-21 ENCOUNTER — Encounter: Payer: Self-pay | Admitting: Allergy and Immunology

## 2022-09-04 ENCOUNTER — Encounter: Payer: Self-pay | Admitting: Pulmonary Disease

## 2022-09-04 ENCOUNTER — Ambulatory Visit: Payer: Medicare PPO | Admitting: Pulmonary Disease

## 2022-09-04 VITALS — BP 122/68 | HR 88 | Wt 162.6 lb

## 2022-09-04 DIAGNOSIS — G4733 Obstructive sleep apnea (adult) (pediatric): Secondary | ICD-10-CM

## 2022-09-04 DIAGNOSIS — J4530 Mild persistent asthma, uncomplicated: Secondary | ICD-10-CM | POA: Diagnosis not present

## 2022-09-04 NOTE — Patient Instructions (Signed)
Nice to see you again  No changes to medications  Continue the CPAP as you are!  Return to clinic in 6 months or sooner as needed

## 2022-09-04 NOTE — Progress Notes (Signed)
$@Patientg$  ID: Karen Snyder, female    DOB: 12/13/1940, 82 y.o.   MRN: [id number]  Chief Complaint  Patient presents with   Follow-up    Follow up for cough and hypoxemia. Pt states that she is doing well. She is not taking trazadone at night. She states the xanax is better for her to sleep. Pt is using cpap at night.     Referring provider: Myrlene Broker, MD  HPI:   82 y.o. woman whom we are seeing in follow up  for evaluation of acute hypoxemic respiratory failure and DOE felt largely due to hypervolemia related to CHF worsened in atrial arrhythmia now following up after newly diagnosed OSA.  Most recent cardiology note reviewed.  Overall doing okay.  At last visit some concern for dropping saturations at home.  Her saturations here were fine.  She notes that checking at home, sats have been fine.  Reviewed CPAP usage.  Good adherence, AHI usually below 5.  HPI at initial visit: Patient with few months of gradual dyspnea.  Issue with chronic cough for decades.  Was seen in her allergist and noted to be incidentally hypoxemic.  Oxygen was ordered for her.  Work-up included CT high-res on my review interpretation shows bilateral pleural effusions with streaky opacity in the bases favored to represent atelectasis.  Read concern for possible ILD.  Her diuretic was increased from Lasix 20 mg every Monday Wednesday Friday to 20 mg daily.  She reports 8 pound weight loss in that week.  Her dyspnea has improved.  She walked in the clinic today and oxygen saturation 96% on room air.  She does at home without oxygen over the last few days her oxygen saturation has not dropped below 88%.  We discussed at length her imaging findings which to me is most consistent with volume overload.  Improvement in symptoms and hypoxemia with diuresis fits with this.  Fibrosis from any kind will not improve with diuresis alone.  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, seasonal allergies,  asthma Surgical history: Adenoidectomy/tonsillectomy, hernia repair Family history: Mother with hypertension, atrial fibrillation, father with hypertension, CAD Social history: Never smoker, lives in [facility] / Pulmonary Flowsheets:   ACT:  Asthma Control Test ACT Total Score  07/15/2017  3:00 PM 23  02/18/2017  3:00 PM 24    MMRC:     No data to display           Epworth:      No data to display           Tests:   FENO:  No results found for: "NITRICOXIDE"  PFT:    Latest Ref Rng & Units 01/02/2022    9:45 AM  PFT Results  FVC-Pre L 1.99   FVC-Predicted Pre % 85   FVC-Post L 2.06   FVC-Predicted Post % 88   Pre FEV1/FVC % % 80   Post FEV1/FCV % % 82   FEV1-Pre L 1.58   FEV1-Predicted Pre % 92   FEV1-Post L 1.69   DLCO uncorrected ml/min/mmHg 10.84   DLCO UNC% % 61   DLCO corrected ml/min/mmHg 10.84   DLCO COR %Predicted % 61   DLVA Predicted % 79   TLC L 4.48   TLC % Predicted % 94   RV % Predicted % 72   Spirometry reviewed interpreted as normal, no bronchodilator response, lung volumes within normal limits, DLCO cannot be interpreted given lack of repeatability, lack of  quality per ATS standards  WALK:      No data to display           Imaging: Personally reviewed and as per EMR discussion this note No results found.  Lab Results: Personally reviewed CBC    Component Value Date/Time   WBC 9.0 07/19/2021 1115   RBC 4.02 07/19/2021 1115   HGB 11.1 (L) 07/19/2021 1115   HCT 34.3 (L) 07/19/2021 1115   PLT 366 07/19/2021 1115   MCV 85.3 07/19/2021 1115   MCH 27.6 07/19/2021 1115   MCHC 32.4 07/19/2021 1115   RDW 13.8 07/19/2021 1115   LYMPHSABS 0.7 07/19/2021 1115   MONOABS 0.7 07/19/2021 1115   EOSABS 0.1 07/19/2021 1115   BASOSABS 0.1 07/19/2021 1115    BMET    Component Value Date/Time   NA 144 05/14/2022 1612   K 3.6 05/14/2022 1612   CL 105 05/14/2022 1612   CO2 24 05/14/2022 1612   GLUCOSE  95 05/14/2022 1612   GLUCOSE 98 07/30/2021 1011   BUN 20 05/14/2022 1612   CREATININE 1.03 (H) 05/14/2022 1612   CALCIUM 8.9 05/14/2022 1612   GFRNONAA >60 07/19/2021 1115    BNP    Component Value Date/Time   BNP 187.4 (H) 07/19/2021 1115    ProBNP    Component Value Date/Time   PROBNP 493 05/14/2022 1612    Specialty Problems       Pulmonary Problems   Mild persistent asthma without complication   Bronchitis   Exertional dyspnea   Rhinosinusitis   Asthma    No Known Allergies  Immunization History  Administered Date(s) Administered   Fluad Quad(high Dose 65+) 05/09/2021   Influenza, High Dose Seasonal PF 04/29/2017, 04/15/2018, 04/25/2019, 05/11/2020   Influenza-Unspecified 05/22/2006, 06/09/2007, 04/15/2008   PFIZER(Purple Top)SARS-COV-2 Vaccination 07/27/2019, 08/17/2019   Pneumococcal Conjugate-13 01/16/2018   Pneumococcal Polysaccharide-23 04/25/2019   Tdap 02/16/2007    Past Medical History:  Diagnosis Date   A-fib ([place])    Asthma    Blepharitis of upper and lower eyelids of both eyes 07/19/2019   Capsulitis of metatarsophalangeal (MTP) joint of left foot 07/01/2016   Dependent edema 06/20/2019   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/19/2019   Environmental and seasonal allergies 04/15/2018   Essential hypertension 04/15/2018   Exertional dyspnea 06/20/2019   GERD (gastroesophageal reflux disease)    GERD without esophagitis 04/15/2018   High blood pressure    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   Mild persistent asthma without complication [id number]   Peripheral drusen, bilateral 07/19/2019   Postmenopausal bleeding 08/23/2015   Rhinosinusitis 01/11/2020   Screening for colon cancer 08/15/2019   Tachycardia 01/24/2021   TMJ tenderness, right 08/15/2019    Tobacco History: Social History   Tobacco Use  Smoking Status Never   Passive exposure: Never  Smokeless Tobacco Never   Counseling given: Not  Answered   Continue to not smoke  Outpatient Encounter Medications as of 09/04/2022  Medication Sig   albuterol (PROVENTIL HFA) 108 (90 Base) MCG/ACT inhaler Inhale two puffs every four to six hours as needed for cough or wheeze.   ALPRAZolam (XANAX) 0.25 MG tablet Take one tablet at bedtime as directed.   apixaban (ELIQUIS) 5 MG TABS tablet Take 1 tablet (5 mg total) by mouth in the morning and at bedtime.   ASMANEX HFA 200 MCG/ACT AERO Inhale 2 puffs into the lungs 2 (two) times daily.   Azelastine HCl (ASTEPRO) 0.15 % SOLN  Place 1 spray into both nostrils at bedtime.   BIOTIN FORTE PO Take 1 tablet by mouth daily.   calcium-vitamin D (OSCAL WITH D) 500-200 MG-UNIT tablet Take 1 tablet by mouth daily.   chlorpheniramine-HYDROcodone (TUSSIONEX) 10-8 MG/5ML Take 5 mLs by mouth at bedtime as needed for cough.   DEXILANT 60 MG capsule Take 60 mg by mouth in the morning.    diltiazem (DILACOR XR) 120 MG 24 hr capsule Take 1 capsule (120 mg total) by mouth daily.   dronedarone (MULTAQ) 400 MG tablet Take 1 tablet (400 mg total) by mouth 2 (two) times daily with a meal.   furosemide (LASIX) 20 MG tablet Take 1 tablet (20 mg total) by mouth daily.   Multiple Vitamin (MULTI-VITAMIN DAILY PO) Take 1 tablet by mouth daily.   potassium chloride (MICRO-K) 10 MEQ CR capsule Take 1 capsule (10 mEq total) by mouth daily.   telmisartan (MICARDIS) 40 MG tablet Take 1 tablet (40 mg total) by mouth daily.   [DISCONTINUED] traZODone (DESYREL) 50 MG tablet Take 1 tablet (50 mg total) by mouth at bedtime. (Patient not taking: Reported on 08/20/2022)   No facility-administered encounter medications on file as of 09/04/2022.     Review of Systems  Review of Systems  N/a Physical Exam  BP 122/68 (BP Location: Left Arm, Patient Position: Sitting, Cuff Size: Normal)   Pulse 88   Wt 162 lb 9.6 oz (73.8 kg)   SpO2 98%   BMI 30.23 kg/m   Wt Readings from Last 5 Encounters:  09/04/22 162 lb 9.6 oz (73.8 kg)   08/20/22 162 lb 6.4 oz (73.7 kg)  06/19/22 167 lb 12.8 oz (76.1 kg)  05/14/22 168 lb (76.2 kg)  04/04/22 167 lb (75.8 kg)    BMI Readings from Last 5 Encounters:  09/04/22 30.23 kg/m  08/20/22 30.19 kg/m  06/19/22 30.69 kg/m  05/14/22 31.23 kg/m  04/04/22 31.04 kg/m     Physical Exam General: well appearing, in NAD Eyes: EOMI, no icterus Neck: supple, no JVP Pulm: Normal work of breathing, clear CV: Irregular, no murmur Abd: ND, BS present MSK: No synovitis, No joint effusion Neuro: Normal gait, no weakness Psych: Normal mood, full affect  Assessment & Plan:    Obstructive sleep apnea: On recent polysomnography at home.  She is using CPAP, auto titrating.  She is currently benefiting clinically from ongoing CPAP use.  Chronic cough: Present since the 1990s.  Suspect will be exceedingly difficult to treat given duration of symptoms.  Ongoing work-up and treatment per allergist. Not improved on ICS.  She has small supply of Tussionex to be used at night only as needed.  Most likely reflux related given her hiatal hernia on imaging.  Especially with lack of response to other therapies.     Return in about 6 months (around 03/05/2023).   Lanier Clam, MD 09/04/2022

## 2022-11-07 NOTE — Progress Notes (Unsigned)
Cardiology Office Note:    Date:  11/10/2022   ID:  Karen Snyder, DOB [birth year], MRN [id number]  PCP:  Hadley Pen, MD  Cardiologist:  Norman Herrlich, MD    Referring MD: Hadley Pen, MD    ASSESSMENT:    1. Atypical atrial flutter   2. High risk medication use   3. 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  Concerns regarding medicines are outlined above.  No orders of the defined types were placed in this encounter.  No orders of the defined types were placed in this encounter.   No chief complaint on file.   History of Present Illness:    Karen Snyder is a 82 y.o. female with a hx of atrial flutter previously suppressed with amiodarone but discontinued with concern of pulmonary injury chronic anticoagulation hypertensive heart disease with heart failure and hypoxemia requiring discontinuation of amiodarone last seen 05/14/2022.  She was placed on dronedarone and maintained sinus rhythm.  Compliance with diet, lifestyle and medications: Yes  This become a somewhat complicated visit She is having recurrent gross hematuria she intermittently stops her anticoagulant I told her that this could be indicative of underlying GU pathology and she needs to undergo evaluation including cystoscopy. Her other prominent complaint is trouble sleeping apparently she took some hypnotic that it QT effects she stopped and she has been taking  Xanax which is up some assistance Very upset about her husband's illness She still has shortness of breath with more than usual activities and is frustrated She is not having edema orthopnea chest pain palpitation or syncope. Past Medical History:  Diagnosis Date   A-fib    Asthma    Blepharitis of upper and lower eyelids of both eyes 07/19/2019   Capsulitis of metatarsophalangeal (MTP) joint of left foot 07/01/2016   Dependent edema 06/20/2019   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/19/2019   Environmental and seasonal allergies 04/15/2018   Essential hypertension 04/15/2018   Exertional dyspnea 06/20/2019   GERD (gastroesophageal reflux disease)    GERD without esophagitis 04/15/2018   High blood pressure    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   Mild persistent asthma without complication 04/15/2018   Peripheral drusen, bilateral 07/19/2019   Postmenopausal bleeding 08/23/2015   Rhinosinusitis 01/11/2020   Screening for colon cancer 08/15/2019   Tachycardia 01/24/2021   TMJ tenderness, right 08/15/2019    Past Surgical History:  Procedure Laterality Date   ADENOIDECTOMY  1946   HERNIA REPAIR  2016   TONSILLECTOMY  1946    Current Medications: Current Meds  Medication Sig   albuterol (PROVENTIL HFA) 108 (90 Base) MCG/ACT inhaler Inhale two puffs every four to six hours as needed for cough or wheeze.   ALPRAZolam (XANAX) 0.25 MG tablet Take one tablet at bedtime as directed.   apixaban (ELIQUIS) 5 MG TABS tablet Take 1 tablet (5 mg total) by mouth in the morning and at bedtime.   ASMANEX  HFA 200 MCG/ACT AERO Inhale 2 puffs into the lungs 2 (two) times daily.   Azelastine HCl (ASTEPRO) 0.15 % SOLN Place 1 spray into both nostrils at bedtime.   BIOTIN FORTE PO Take 1 tablet by mouth daily.   calcium-vitamin D (OSCAL WITH D) 500-200 MG-UNIT tablet Take 1 tablet by mouth daily.   DEXILANT 60 MG capsule Take 60 mg by mouth in the morning.     diltiazem (DILACOR XR) 120 MG 24 hr capsule Take 1 capsule (120 mg total) by mouth daily.   dronedarone (MULTAQ) 400 MG tablet Take 1 tablet (400 mg total) by mouth 2 (two) times daily with a meal.   furosemide (LASIX) 20 MG tablet Take 1 tablet (20 mg total) by mouth daily.   Multiple Vitamin (MULTI-VITAMIN DAILY PO) Take 1 tablet by mouth daily.   potassium chloride (MICRO-K) 10 MEQ CR capsule Take 1 capsule (10 mEq total) by mouth daily.   telmisartan (MICARDIS) 40 MG tablet Take 1 tablet (40 mg total) by mouth daily.     Allergies:   Patient has no known allergies.   Social History   Socioeconomic History   Marital status: Married    Spouse name: Not on file   Number of children: Not on file   Years of education: Not on file   Highest education level: Not on file  Occupational History   Not on file  Tobacco Use   Smoking status: Never    Passive exposure: Never   Smokeless tobacco: Never  Vaping Use   Vaping Use: Never used  Substance and Sexual Activity   Alcohol use: No   Drug use: No   Sexual activity: Not on file  Other Topics Concern   Not on file  Social History Narrative   Not on file   Social Determinants of Health   Financial Resource Strain: Not on file  Food Insecurity: Not on file  Transportation Needs: Not on file  Physical Activity: Not on file  Stress: Not on file  Social Connections: Not on file     Family History: The patient's family history includes Atrial fibrillation in her mother; Eczema in her sister; Heart Problems in her maternal grandfather and maternal grandmother; Heart attack in her father; Hypertension in her father and mother. ROS:   Please see the history of present illness.    All other systems reviewed and are negative.  EKGs/Labs/Other Studies Reviewed:    The following studies were reviewed today:  Cardiac Studies & Procedures       ECHOCARDIOGRAM  ECHOCARDIOGRAM COMPLETE 03/11/2021  Narrative ECHOCARDIOGRAM  REPORT    Patient Name:   Karen Snyder Date of Exam: 02/19/2021 Medical Rec #:  [id number]  Height:       61.0 in Accession #:    [id number] Weight:       160.0 lb Date of Birth:  10-26-1940 BSA:          1.718 m Patient Age:    79 years   BP:           140/90 mmHg Patient Gender: F          HR:           78 bpm. Exam Location:  [facility]  Procedure: 2D Echo  Indications:    PAF (paroxysmal atrial fibrillation) (HCC) [I48.0 (ICD-10-CM)]; SOB (shortness of breath) [R06.02 (ICD-10-CM)]; Hypertension, unspecified type [I10 (ICD-10-CM)]  History:        Patient has no prior history of  Echocardiogram examinations. Arrythmias:Tachycardia, Signs/Symptoms:Dependent edema; Risk Factors:Hypertension.  Sonographer:    Louie Boston RDCS Referring Phys: [id number] KARDIE TOBB  IMPRESSIONS   1. Left ventricular ejection fraction, by estimation, is 60 to 65%. The left ventricle has normal function. The left ventricle has no regional wall motion abnormalities. Left ventricular diastolic parameters are consistent with Grade III diastolic dysfunction (restrictive). The average left ventricular global longitudinal strain is -11.2 %. The global longitudinal strain is abnormal. 2. Right ventricular systolic function is normal. The right ventricular size is normal. There is normal pulmonary artery systolic pressure. 3. Left atrial size was mildly dilated. 4. The mitral valve is normal in structure. Mild mitral valve regurgitation. No evidence of mitral stenosis. 5. The aortic valve is normal in structure. Aortic valve regurgitation is not visualized. Mild aortic valve sclerosis is present, with no evidence of aortic valve stenosis. 6. The inferior vena cava is normal in size with greater than 50% respiratory variability, suggesting right atrial pressure of 3 mmHg.  FINDINGS Left Ventricle: Left ventricular ejection fraction, by estimation, is 60 to 65%. The left ventricle has normal function. The left ventricle  has no regional wall motion abnormalities. The average left ventricular global longitudinal strain is -11.2 %. The global longitudinal strain is abnormal. The left ventricular internal cavity size was normal in size. There is no left ventricular hypertrophy. Left ventricular diastolic parameters are consistent with Grade III diastolic dysfunction (restrictive).  Right Ventricle: The right ventricular size is normal. No increase in right ventricular wall thickness. Right ventricular systolic function is normal. There is normal pulmonary artery systolic pressure. The tricuspid regurgitant velocity is 2.61 m/s, and with an assumed right atrial pressure of 3 mmHg, the estimated right ventricular systolic pressure is 30.2 mmHg.  Left Atrium: Left atrial size was mildly dilated.  Right Atrium: Right atrial size was normal in size.  Pericardium: There is no evidence of pericardial effusion.  Mitral Valve: The mitral valve is normal in structure. Mild mitral valve regurgitation. No evidence of mitral valve stenosis.  Tricuspid Valve: The tricuspid valve is normal in structure. Tricuspid valve regurgitation is mild . No evidence of tricuspid stenosis.  Aortic Valve: The aortic valve is normal in structure. Aortic valve regurgitation is not visualized. Mild aortic valve sclerosis is present, with no evidence of aortic valve stenosis.  Pulmonic Valve: The pulmonic valve was normal in structure. Pulmonic valve regurgitation is not visualized. No evidence of pulmonic stenosis.  Aorta: The aortic arch was not well visualized.  Venous: A systolic blunting flow pattern is recorded from the right upper pulmonary vein. The inferior vena cava is normal in size with greater than 50% respiratory variability, suggesting right atrial pressure of 3 mmHg.  IAS/Shunts: No atrial level shunt detected by color flow Doppler.   LEFT VENTRICLE PLAX 2D LVIDd:         4.40 cm  Diastology LVIDs:         2.40 cm  LV e'  medial:    4.67 cm/s LV PW:         1.00 cm  LV E/e' medial:  30.6 LV IVS:        1.00 cm  LV e' lateral:   5.22 cm/s LVOT diam:     1.80 cm  LV E/e' lateral: 27.4 LV SV:         65 LV SV Index:   38       2D Longitudinal Strain LVOT Area:     2.54 cm 2D  Strain GLS Avg:     -11.2 %   RIGHT VENTRICLE             IVC RV S prime:     14.00 cm/s  IVC diam: 1.60 cm TAPSE (M-mode): 2.1 cm  LEFT ATRIUM             Index       RIGHT ATRIUM           Index LA diam:        4.10 cm 2.39 cm/m  RA Area:     12.70 cm LA Vol (A2C):   63.7 ml 37.08 ml/m RA Volume:   29.40 ml  17.11 ml/m LA Vol (A4C):   69.1 ml 40.22 ml/m LA Biplane Vol: 67.3 ml 39.18 ml/m AORTIC VALVE LVOT Vmax:   119.00 cm/s LVOT Vmean:  82.900 cm/s LVOT VTI:    0.256 m  AORTA Ao Root diam: 2.70 cm Ao Asc diam:  2.70 cm Ao Desc diam: 1.70 cm  MITRAL VALVE                TRICUSPID VALVE MV Area (PHT): 4.71 cm     TR Peak grad:   27.2 mmHg MV Decel Time: 161 msec     TR Vmax:        261.00 cm/s MV E velocity: 143.00 cm/s MV A velocity: 44.70 cm/s   SHUNTS MV E/A ratio:  3.20         Systemic VTI:  0.26 m Systemic Diam: 1.80 cm  Norman Herrlich MD Electronically signed by Norman Herrlich MD Signature Date/Time: 03/11/2021/5:48:28 PM    Final               Recent Labs: 05/14/2022: ALT 8; BUN 20; Creatinine, Ser 1.03; NT-Pro BNP 493; Potassium 3.6; Sodium 144  Recent Lipid Panel No results found for: "CHOL", "TRIG", "HDL", "CHOLHDL", "VLDL", "LDLCALC", "LDLDIRECT"  Physical Exam:    VS:  BP 138/74 (BP Location: Right Arm, Patient Position: Sitting)   Pulse 82   Ht 5' 2.5" (1.588 m)   Wt 165 lb (74.8 kg)   SpO2 97%   BMI 29.70 kg/m     Wt Readings from Last 3 Encounters:  11/10/22 165 lb (74.8 kg)  09/04/22 162 lb 9.6 oz (73.8 kg)  08/20/22 162 lb 6.4 oz (73.7 kg)     GEN: Anxious well nourished, well developed in no acute distress HEENT: Normal NECK: No JVD; No carotid bruits LYMPHATICS: No  lymphadenopathy CARDIAC: RRR, no murmurs, rubs, gallops RESPIRATORY:  Clear to auscultation without rales, wheezing or rhonchi  ABDOMEN: Soft, non-tender, non-distended MUSCULOSKELETAL:  No edema; No deformity  SKIN: Warm and dry NEUROLOGIC:  Alert and oriented x 3 PSYCHIATRIC:  Normal affect    Signed, Norman Herrlich, MD  11/10/2022 4:39 PM    [facility]

## 2022-11-10 ENCOUNTER — Encounter: Payer: Self-pay | Admitting: Cardiology

## 2022-11-10 ENCOUNTER — Ambulatory Visit: Payer: Medicare PPO | Attending: Cardiology | Admitting: Cardiology

## 2022-11-10 VITALS — BP 138/74 | HR 82 | Ht 62.5 in | Wt 165.0 lb

## 2022-11-10 DIAGNOSIS — Z7901 Long term (current) use of anticoagulants: Secondary | ICD-10-CM

## 2022-11-10 DIAGNOSIS — I484 Atypical atrial flutter: Secondary | ICD-10-CM | POA: Diagnosis not present

## 2022-11-10 DIAGNOSIS — I110 Hypertensive heart disease with heart failure: Secondary | ICD-10-CM

## 2022-11-10 DIAGNOSIS — Z79899 Other long term (current) drug therapy: Secondary | ICD-10-CM

## 2022-11-10 NOTE — Patient Instructions (Signed)
Medication Instructions:  Your physician recommends that you continue on your current medications as directed. Please refer to the Current Medication list given to you today.  *If you need a refill on your cardiac medications before your next appointment, please call your pharmacy*   Lab Work: Your physician recommends that you return for lab work in:   Labs today: CBC, CMP, Pro BNP, TSH  If you have labs (blood work) drawn today and your tests are completely normal, you will receive your results only by: MyChart Message (if you have MyChart) OR A paper copy in the mail If you have any lab test that is abnormal or we need to change your treatment, we will call you to review the results.   Testing/Procedures: None   Follow-Up: At [facility], you and your health needs are our priority.  As part of our continuing mission to provide you with exceptional heart care, we have created designated Provider Care Teams.  These Care Teams include your primary Cardiologist (physician) and Advanced Practice Providers (APPs -  Physician Assistants and Nurse Practitioners) who all work together to provide you with the care you need, when you need it.  We recommend signing up for the patient portal called "MyChart".  Sign up information is provided on this After Visit Summary.  MyChart is used to connect with patients for Virtual Visits (Telemedicine).  Patients are able to view lab/test results, encounter notes, upcoming appointments, etc.  Non-urgent messages can be sent to your provider as well.   To learn more about what you can do with MyChart, go to [email or web address].    Your next appointment:   6 month(s)  Provider:   Norman Herrlich, MD    Other Instructions None

## 2022-11-11 ENCOUNTER — Telehealth: Payer: Self-pay

## 2022-11-11 ENCOUNTER — Other Ambulatory Visit: Payer: Self-pay

## 2022-11-11 MED ORDER — APIXABAN 5 MG PO TABS: 5.0000 mg | ORAL_TABLET | Freq: Two times a day (BID) | ORAL | 1 refills | Status: DC

## 2022-11-11 MED ORDER — MULTAQ 400 MG PO TABS: 400.0000 mg | ORAL_TABLET | Freq: Two times a day (BID) | ORAL | 3 refills | Status: DC

## 2022-11-11 NOTE — Addendum Note (Signed)
Addended by: Satira Sark on: 11/11/2022 10:46 AM   Modules accepted: Orders

## 2022-11-11 NOTE — Telephone Encounter (Signed)
Pt last saw Dr Dulce Sellar 11/10/22, last labs 05/14/22 Creat 1.03, age 82, weight 74.8kg, based on specified criteria pt is on appropriate dosage of Eliquis  BID for aflutter.  Will refill rx.

## 2022-11-13 ENCOUNTER — Other Ambulatory Visit: Payer: Self-pay

## 2022-11-13 ENCOUNTER — Telehealth: Payer: Self-pay | Admitting: Cardiology

## 2022-11-13 ENCOUNTER — Telehealth: Payer: Self-pay

## 2022-11-13 DIAGNOSIS — N950 Postmenopausal bleeding: Secondary | ICD-10-CM

## 2022-11-13 LAB — COMPREHENSIVE METABOLIC PANEL
ALT: 11 IU/L (ref 0–32)
AST: 15 IU/L (ref 0–40)
Albumin/Globulin Ratio: 1.5 (ref 1.2–2.2)
Albumin: 4 g/dL (ref 3.7–4.7)
Alkaline Phosphatase: 74 IU/L (ref 44–121)
BUN/Creatinine Ratio: 16 (ref 12–28)
BUN: 15 mg/dL (ref 8–27)
Bilirubin Total: 0.4 mg/dL (ref 0.0–1.2)
CO2: 22 mmol/L (ref 20–29)
Calcium: 9.3 mg/dL (ref 8.7–10.3)
Chloride: 104 mmol/L (ref 96–106)
Creatinine, Ser: 0.91 mg/dL (ref 0.57–1.00)
Globulin, Total: 2.7 g/dL (ref 1.5–4.5)
Glucose: 97 mg/dL (ref 70–99)
Potassium: 4.2 mmol/L (ref 3.5–5.2)
Sodium: 141 mmol/L (ref 134–144)
Total Protein: 6.7 g/dL (ref 6.0–8.5)
eGFR: 63 mL/min/{1.73_m2} (ref >59)

## 2022-11-13 LAB — CBC
Hematocrit: 33.5 % — ABNORMAL LOW (ref 34.0–46.6)
Hemoglobin: 10.1 g/dL — ABNORMAL LOW (ref 11.1–15.9)
MCH: 24.8 pg — ABNORMAL LOW (ref 26.6–33.0)
MCHC: 30.1 g/dL — ABNORMAL LOW (ref 31.5–35.7)
MCV: 82 fL (ref 79–97)
Platelets: 320 10*3/uL (ref 150–450)
RBC: 4.08 x10E6/uL (ref 3.77–5.28)
RDW: 15.2 % (ref 11.7–15.4)
WBC: 4.8 10*3/uL (ref 3.4–10.8)

## 2022-11-13 LAB — TSH: TSH: 2.91 u[IU]/mL (ref 0.450–4.500)

## 2022-11-13 LAB — PRO B NATRIURETIC PEPTIDE: NT-Pro BNP: 528 pg/mL (ref 0–738)

## 2022-11-13 NOTE — Telephone Encounter (Signed)
Called patient and informed her of the results of her lab work and Dr. Munley's recommendation below:  "She has become anemic, she should stop her anticoagulant until seen by urology contact us after that visit."  Patient stated that she had contacted the urology office and she was told they did not received the consult. I placed the consult again in Epic and asked that she contact the urology office for an appointment. She stated that before when she was unable to get an appointment with the urology office she had scheduled an appointment with her PCP on 11/24/22 to address her hematuria. I confirmed with Dr. Munley that it was alright for her to be off her anticoagulant for that long and he was fine with the time frame. I informed the patient of this and she had no further questions at this time. 

## 2022-11-13 NOTE — Telephone Encounter (Signed)
Called patient and informed her of the results of her lab work and Dr. Hulen Shouts recommendation below:  "She has become anemic, she should stop her anticoagulant until seen by urology contact us after that visit."  Patient stated that she had contacted the urology office and she was told they did not received the consult. I placed the consult again in Epic and asked that she contact the urology office for an appointment. She stated that before when she was unable to get an appointment with the urology office she had scheduled an appointment with her PCP on 11/24/22 to address her hematuria. I confirmed with Dr. Dulce Sellar that it was alright for her to be off her anticoagulant for that long and he was fine with the time frame. I informed the patient of this and she had no further questions at this time.

## 2022-11-13 NOTE — Telephone Encounter (Signed)
Patient is requesting to speak with RN Gerlene Burdock about a referral for Urology. Please advise

## 2022-11-13 NOTE — Telephone Encounter (Signed)
° °  Pt is returning call to get lab result °

## 2022-11-27 ENCOUNTER — Other Ambulatory Visit: Payer: Self-pay | Admitting: Allergy and Immunology

## 2022-12-16 ENCOUNTER — Other Ambulatory Visit (HOSPITAL_COMMUNITY): Payer: Self-pay

## 2022-12-16 ENCOUNTER — Telehealth: Payer: Self-pay

## 2022-12-16 ENCOUNTER — Telehealth: Payer: Self-pay | Admitting: Cardiology

## 2022-12-16 NOTE — Telephone Encounter (Signed)
Patient has enough Multaq to last until Thursday- the pharmacy [facility] on [address]) is telling her that she needs a prior auth and they have sent [place] 2 notifications.  Please advise  Best number for patient is [phone]

## 2022-12-16 NOTE — Telephone Encounter (Signed)
Pharmacy Patient Advocate Encounter  Prior Authorization for Karie Soda has been approved by [employer] (ins).    KEY# [id number]  Effective dates: 1.1.24 through 12.31.24

## 2022-12-16 NOTE — Telephone Encounter (Signed)
Pharmacy Patient Advocate Encounter  Prior Authorization for Multaq has been approved by [employer] (ins).    KEY# [id number]  Effective dates: 1.1.24 through 12.31.24 

## 2022-12-16 NOTE — Telephone Encounter (Signed)
Pharmacy Patient Advocate Encounter   Received notification from RN that prior authorization for MULTAQ 400 is required/requested.   PA submitted on 5.28.24 to (ins) [employer] via CoverMyMeds Key or (Medicaid) confirmation # [id number]    Status is pending

## 2022-12-17 NOTE — Telephone Encounter (Signed)
Pt is aware.  

## 2022-12-23 ENCOUNTER — Telehealth: Payer: Self-pay | Admitting: *Deleted

## 2022-12-23 NOTE — Telephone Encounter (Signed)
Dr. Dulce Sellar,  You saw this patient on 11/10/2022. Will you please comment on medical clearance for cystoscopy/transurethral resection of bladder tumor?  Please route your response to P CV DIV Preop. I will communicate with requesting office once you have given recommendations.   Thank you!  Carlos Levering, NP

## 2022-12-23 NOTE — Telephone Encounter (Signed)
Patient with diagnosis of afib on Eliquis for anticoagulation.    Procedure: cystoscopy/transurethral resection of bladder tumor  Date of procedure: TBD  CHA2DS2-VASc Score = 5  This indicates a 7.2% annual risk of stroke. The patient's score is based upon: CHF History: 1 HTN History: 1 Diabetes History: 0 Stroke History: 0 Vascular Disease History: 0 Age Score: 2 Gender Score: 1   CrCl 46mL/min using adj body weight Platelet count 320K  Request is to hold Eliquis for 3 days prior and 3 days post procedure which would be a total of 7 days off of anticoagulation. Longer than typical hold request however acceptable based on her CV risk. Ok to hold as requested.  **This guidance is not considered finalized until pre-operative APP has relayed final recommendations.**

## 2022-12-23 NOTE — Telephone Encounter (Signed)
   Pre-operative Risk Assessment    Patient Name: Karen Snyder  DOB: October 06, 1940 MRN: [id number]      Request for Surgical Clearance    Procedure:   Cystoscopy,Transurethral Resection of Bladder Tumor  Date of Surgery:  Clearance TBD                                 Surgeon:  Dr. Lewanda Rife Group or Practice Name:  [facility] Phone number:  [phone] Fax number:  [phone]   Type of Clearance Requested:   - Medical  - Pharmacy:  Hold Apixaban (Eliquis) 3 days prior and 3 days post   Type of Anesthesia:  Not Indicated   Additional requests/questions:    Signed, Emmit Pomfret   12/23/2022, 8:20 AM

## 2022-12-23 NOTE — Telephone Encounter (Signed)
Please advise holding Eliquis prior to cystoscopy/transurethral resection of bladder tumor.  Thank you!  DW

## 2022-12-23 NOTE — Telephone Encounter (Signed)
   Name: Karen Snyder  DOB: 05-Oct-1940  MRN: [id number]   Primary Cardiologist: None  Chart reviewed as part of pre-operative protocol coverage. [name] was last seen on 11/10/2022 by Dr. Dulce Sellar.  Per Dr. Dulce Sellar "I went back and reviewed my office note.  From a cardiology perspective she is optimized fortunately she is maintaining sinus rhythm on dronedarone and she can withdraw her anticoagulant for the urologic procedure."  Therefore, based on ACC/AHA guidelines, the patient would be at acceptable risk for the planned procedure without further cardiovascular testing.   Per Pharm.D.: Patient with diagnosis of afib on Eliquis for anticoagulation.     Procedure: cystoscopy/transurethral resection of bladder tumor  Date of procedure: TBD   CHA2DS2-VASc Score = 5  This indicates a 7.2% annual risk of stroke. The patient's score is based upon: CHF History: 1 HTN History: 1 Diabetes History: 0 Stroke History: 0 Vascular Disease History: 0 Age Score: 2 Gender Score: 1   CrCl 84mL/min using adj body weight Platelet count 320K   Request is to hold Eliquis for 3 days prior and 3 days post procedure which would be a total of 7 days off of anticoagulation. Longer than typical hold request however acceptable based on her CV risk. Ok to hold as requested.   I will route this recommendation to the requesting party via Epic fax function and remove from pre-op pool. Please call with questions.  Carlos Levering, NP 12/23/2022, 1:25 PM

## 2023-01-23 ENCOUNTER — Other Ambulatory Visit: Payer: Self-pay | Admitting: Cardiology

## 2023-02-19 ENCOUNTER — Ambulatory Visit: Payer: Medicare PPO | Admitting: Allergy and Immunology

## 2023-02-24 ENCOUNTER — Other Ambulatory Visit: Payer: Self-pay | Admitting: Cardiology

## 2023-03-02 ENCOUNTER — Other Ambulatory Visit: Payer: Self-pay | Admitting: Cardiology

## 2023-03-16 ENCOUNTER — Encounter: Payer: Self-pay | Admitting: Allergy and Immunology

## 2023-03-16 ENCOUNTER — Ambulatory Visit: Payer: Medicare PPO | Admitting: Allergy and Immunology

## 2023-03-16 VITALS — BP 116/72 | HR 68 | Resp 16 | Ht 61.5 in | Wt 156.0 lb

## 2023-03-16 DIAGNOSIS — K219 Gastro-esophageal reflux disease without esophagitis: Secondary | ICD-10-CM

## 2023-03-16 DIAGNOSIS — J3089 Other allergic rhinitis: Secondary | ICD-10-CM | POA: Diagnosis not present

## 2023-03-16 DIAGNOSIS — J4530 Mild persistent asthma, uncomplicated: Secondary | ICD-10-CM

## 2023-03-16 NOTE — Progress Notes (Unsigned)
[place] - [place] - [place] - [place] - [name]   Follow-up Note  Referring Provider: Hadley Pen, MD Primary Provider: Hadley Pen, MD Date of Office Visit: 03/16/2023  Subjective:   Karen Snyder (DOB: January 14, 1941) is a 82 y.o. 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  I last saw him in his clinic 20 August 2022.  Overall she has done really well.  She did contract COVID apparently in June or July with a high temperature and a cough but that has since resolved without any therapy and overall she thinks that her cough is under pretty good control at this point in time when she continues to treat both reflux and inflammation of her airway with a specific plan.  Rarely does she use any short acting bronchodilator.  She has been consistently using Asmanex and she has been consistently using Dexilant to address her inflammation and reflux induced respiratory disease.  She does use Astepro on occasion.  She does use CPAP and during her last visit she was having some difficulty falling asleep and I gave her short course of alprazolam and now she is using Unisom.  She has an anemia for which she has started iron supplementation.  She had removal of a bladder cyst giving rise to hematuria.  Allergies as of 03/16/2023   No Known Allergies      Medication List    albuterol 108 (90 Base) MCG/ACT inhaler Commonly known as: Proventil HFA Inhale two puffs every four to six hours as needed for cough or wheeze.   ALPRAZolam 0.25 MG tablet Commonly known as: XANAX Take one tablet at bedtime as directed.   apixaban 5 MG Tabs tablet Commonly known as: ELIQUIS Take 1 tablet (5 mg total) by mouth in the morning  and at bedtime.   Asmanex HFA 200 MCG/ACT Aero Generic drug: Mometasone Furoate INHALE TWO DOSES TWICE DAILY TO PREVENT COUGH OR WHEEZE. RINSE, GARGLE, AND SPIT AFTER USE.   Astepro 0.15 % Soln Generic drug: Azelastine HCl Place 1 spray into both nostrils at bedtime.   BIOTIN FORTE PO Take 1 tablet by mouth daily.   calcium-vitamin D 500-200 MG-UNIT tablet Commonly known as: OSCAL WITH D Take 1 tablet by mouth daily.   Dexilant 60 MG capsule Generic drug: dexlansoprazole Take 60 mg by mouth in the morning.   diltiazem 120 MG 24 hr capsule Commonly known as: Dilt-XR Take 1 capsule (120 mg total) by mouth daily.   furosemide 20 MG tablet Commonly known as: LASIX Take 1 tablet (20 mg total) by mouth daily.   Multaq 400 MG tablet Generic drug: dronedarone Take 1 tablet (400 mg total) by mouth 2 (two) times daily with a meal.   MULTI-VITAMIN DAILY PO Take 1 tablet by mouth daily.   potassium chloride 10 MEQ CR capsule Commonly known as: MICRO-K TAKE 1 CAPSULE BY MOUTH DAILY.   telmisartan 40 MG tablet Commonly known as: MICARDIS TAKE 1 TABLET BY MOUTH DAILY      Past Medical History:  Diagnosis Date   A-fib (HCC)    Asthma    Blepharitis of upper and lower eyelids of both eyes 07/19/2019   Capsulitis of metatarsophalangeal (MTP) joint of left foot 07/01/2016   Dependent edema 06/20/2019  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/19/2019   Environmental and seasonal allergies 04/15/2018   Essential hypertension 04/15/2018   Exertional dyspnea 06/20/2019   GERD (gastroesophageal reflux disease)    GERD without esophagitis 04/15/2018   High blood pressure    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   Mild persistent asthma without complication 04/15/2018   Peripheral drusen, bilateral 07/19/2019   Postmenopausal bleeding 08/23/2015   Rhinosinusitis 01/11/2020   Screening for colon cancer 08/15/2019   Tachycardia 01/24/2021   TMJ  tenderness, right 08/15/2019    Past Surgical History:  Procedure Laterality Date   ADENOIDECTOMY  1946   HERNIA REPAIR  2016   TONSILLECTOMY  1946    Review of systems negative except as noted in HPI / PMHx or noted below:  Review of Systems  Constitutional: Negative.   HENT: Negative.    Eyes: Negative.   Respiratory: Negative.    Cardiovascular: Negative.   Gastrointestinal: Negative.   Genitourinary: Negative.   Musculoskeletal: Negative.   Skin: Negative.   Neurological: Negative.   Endo/Heme/Allergies: Negative.   Psychiatric/Behavioral: Negative.       Objective:   Vitals:   03/16/23 1521  BP: 116/72  Pulse: 68  Resp: 16  SpO2: 98%   Height: 5' 1.5" (156.2 cm)  Weight: 156 lb (70.8 kg)   Physical Exam Constitutional:      Appearance: She is not diaphoretic.  HENT:     Head: Normocephalic.     Right Ear: Tympanic membrane, ear canal and external ear normal.     Left Ear: Tympanic membrane, ear canal and external ear normal.     Nose: Nose normal. No mucosal edema or rhinorrhea.     Mouth/Throat:     Pharynx: Uvula midline. No oropharyngeal exudate.  Eyes:     Conjunctiva/sclera: Conjunctivae normal.  Neck:     Thyroid: No thyromegaly.     Trachea: Trachea normal. No tracheal tenderness or tracheal deviation.  Cardiovascular:     Rate and Rhythm: Normal rate and regular rhythm.     Heart sounds: Normal heart sounds, S1 normal and S2 normal. No murmur heard. Pulmonary:     Effort: No respiratory distress.     Breath sounds: Normal breath sounds. No stridor. No wheezing or rales.  Lymphadenopathy:     Head:     Right side of head: No tonsillar adenopathy.     Left side of head: No tonsillar adenopathy.     Cervical: No cervical adenopathy.  Skin:    Findings: No erythema or rash.     Nails: There is no clubbing.  Neurological:     Mental Status: She is alert.     Diagnostics: Spirometry was performed and demonstrated an FEV1 of 1.33 at 78 %  of predicted.  Assessment and Plan:   1. Asthma, well controlled, mild persistent   2. Other allergic rhinitis   3. LPRD (laryngopharyngeal reflux disease)      1. Continue to Treat reflux:   A.  Minimize caffeine and chocolate consumption    B.  Dexilant 60 mg 1 time a day in a.m.   2. Continue to Treat inflammation:   A.  Asmanex 200 HFA - 2 inhalations 1-2 time per day (empty lungs)  3. If needed:   A. Proventil HFA 2 puffs every 4-6 hours  B. OTC antihistamine  C. OTC Mucinex DM  D. Nasal saline  E. OTC Pataday-1 drop each eye 1 time per day  FVicki Mallet - 1-2 sprays each nostril 1-2 times per day  4.  Plan for fall flu vaccine  5.  Return to clinic in 6 months or earlier if problem  Overall Zaileigh has done relatively well on her current plan and she has pretty good control of her respiratory tract issues while she addresses both inflammation and reflux induced respiratory disease on her current plan of action and she will continue on this plan and assuming she does well I will see her back in this clinic in 6 months or earlier if there is a problem.  Laurette Schimke, MD Allergy / Immunology [facility]

## 2023-03-16 NOTE — Patient Instructions (Signed)
  1. Continue to Treat reflux:   A.  Minimize caffeine and chocolate consumption    B.  Dexilant 60 mg 1 time a day in a.m.   2. Continue to Treat inflammation:   A.  Asmanex 200 HFA - 2 inhalations 1-2 time per day (empty lungs)  3. If needed:   A. Proventil HFA 2 puffs every 4-6 hours  B. OTC antihistamine  C. OTC Mucinex DM  D. Nasal saline  E. OTC Pataday-1 drop each eye 1 time per day  F. Astepro - 1-2 sprays each nostril 1-2 times per day  4.  Plan for fall flu vaccine  5.  Return to clinic in 6 months or earlier if problem

## 2023-03-17 ENCOUNTER — Encounter: Payer: Self-pay | Admitting: Allergy and Immunology

## 2023-03-17 ENCOUNTER — Other Ambulatory Visit: Payer: Self-pay

## 2023-03-17 MED ORDER — ALBUTEROL SULFATE HFA 108 (90 BASE) MCG/ACT IN AERS: INHALATION_SPRAY | RESPIRATORY_TRACT | 1 refills | Status: AC

## 2023-03-17 NOTE — Addendum Note (Signed)
Addended by: Deborra Medina on: 03/17/2023 03:20 PM   Modules accepted: Orders

## 2023-03-25 ENCOUNTER — Other Ambulatory Visit: Payer: Self-pay | Admitting: *Deleted

## 2023-03-25 MED ORDER — ASMANEX HFA 200 MCG/ACT IN AERO: INHALATION_SPRAY | RESPIRATORY_TRACT | 5 refills | Status: DC

## 2023-04-03 ENCOUNTER — Other Ambulatory Visit: Payer: Self-pay

## 2023-04-03 MED ORDER — DEXLANSOPRAZOLE 60 MG PO CPDR: 60.0000 mg | DELAYED_RELEASE_CAPSULE | Freq: Every morning | ORAL | 5 refills | Status: DC

## 2023-04-09 ENCOUNTER — Telehealth: Payer: Self-pay

## 2023-04-09 NOTE — Telephone Encounter (Signed)
Pre-operative Risk Assessment    Patient Name: Karen Snyder  DOB: [birth year] MRN: [id number]      Request for Surgical Clearance    Procedure:   colonoscopy  Date of Surgery:  Clearance 05/19/23                                 Surgeon:  Dr. Marcial Pacas [place] Surgeon's Group or Practice Name:  [facility] Phone number:  [phone] Fax number:  [phone]   Type of Clearance Requested:   - Pharmacy:  Hold Apixaban (Eliquis) on 05-17-23   Type of Anesthesia:  Not Indicated   Additional requests/questions:    Merlene Laughter   04/09/2023, 2:15 PM

## 2023-04-10 NOTE — Telephone Encounter (Signed)
Patient already has a scheduled appointment with dr. Dulce Sellar 10/21 at 1 pm.

## 2023-04-10 NOTE — Telephone Encounter (Signed)
Name: Karen Snyder  DOB: 03-04-1941  MRN: [id number]  Primary Cardiologist: None   Preoperative team, please contact this patient and set up a phone call appointment for further preoperative risk assessment. Please obtain consent and complete medication review. Thank you for your help.  I confirm that guidance regarding antiplatelet and oral anticoagulation therapy has been completed and, if necessary, noted below.  Per Pharm D, patient may hold Eliquis for 2 days prior to procedure.     Carlos Levering, NP 04/10/2023, 1:26 PM [facility]

## 2023-04-10 NOTE — Telephone Encounter (Signed)
Patient with diagnosis of afib on Eliquis for anticoagulation.    Procedure: colonoscopy Date of procedure: 05/19/23  CHA2DS2-VASc Score = 5  This indicates a 7.2% annual risk of stroke. The patient's score is based upon: CHF History: 1 HTN History: 1 Diabetes History: 0 Stroke History: 0 Vascular Disease History: 0 Age Score: 2 Gender Score: 1   CrCl 42mL/min Platelet count 320K  Per office protocol, patient can hold Eliquis for 2 days prior to procedure as requested.    **This guidance is not considered finalized until pre-operative APP has relayed final recommendations.**

## 2023-04-22 ENCOUNTER — Ambulatory Visit: Payer: Medicare PPO | Admitting: Pulmonary Disease

## 2023-04-22 ENCOUNTER — Encounter: Payer: Self-pay | Admitting: Pulmonary Disease

## 2023-04-22 VITALS — BP 114/60 | HR 74 | Ht 62.5 in | Wt 152.2 lb

## 2023-04-22 DIAGNOSIS — G4733 Obstructive sleep apnea (adult) (pediatric): Secondary | ICD-10-CM | POA: Diagnosis not present

## 2023-04-22 DIAGNOSIS — J4530 Mild persistent asthma, uncomplicated: Secondary | ICD-10-CM | POA: Diagnosis not present

## 2023-04-22 NOTE — Patient Instructions (Addendum)
Nice to see you again  No changes to medication  Once I receive the results of your CPAP adherence I will let you know, we are having trouble accessing the system too, we need to call the company  Return to clinic in 1 year or sooner as needed with Dr. Judeth Horn

## 2023-04-22 NOTE — Progress Notes (Signed)
@Patient  ID: Karen Snyder, female    DOB: October 20, 1940, 82 y.o.   MRN: [id number]  Chief Complaint  Patient presents with   Follow-up    Pt is here for Asthma F/U visit. Pt states she has no problems or concerns today.    Referring provider: Hadley Pen, MD  HPI:   82 y.o. woman whom we are seeing in follow up  for OSA on CPAP  Overall doing okay.  Cough unchanged.  Good to hear his Asmanex.  Reports good adherence to CPAP therapy.  Wearing nightly.  HPI at initial visit: Patient with few months of gradual dyspnea.  Issue with chronic cough for decades.  Was seen in her allergist and noted to be incidentally hypoxemic.  Oxygen was ordered for her.  Work-up included CT high-res on my review interpretation shows bilateral pleural effusions with streaky opacity in the bases favored to represent atelectasis.  Read concern for possible ILD.  Her diuretic was increased from Lasix 20 mg every Monday Wednesday Friday to 20 mg daily.  She reports 8 pound weight loss in that week.  Her dyspnea has improved.  She walked in the clinic today and oxygen saturation 96% on room air.  She does at home without oxygen over the last few days her oxygen saturation has not dropped below 88%.  We discussed at length her imaging findings which to me is most consistent with volume overload.  Improvement in symptoms and hypoxemia with diuresis fits with this.  Fibrosis from any kind will not improve with diuresis alone.  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, seasonal allergies, asthma Surgical history: Adenoidectomy/tonsillectomy, hernia repair Family history: Mother with hypertension, atrial fibrillation, father with hypertension, CAD Social history: Never smoker, lives in [facility] / Pulmonary Flowsheets:   ACT:  Asthma Control Test ACT Total Score  07/15/2017  3:00 PM 23  02/18/2017  3:00 PM 24    MMRC:     No data to display          Epworth:       No data to display          Tests:   FENO:  No results found for: "NITRICOXIDE"  PFT:    Latest Ref Rng & Units 01/02/2022    9:45 AM  PFT Results  FVC-Pre L 1.99   FVC-Predicted Pre % 85   FVC-Post L 2.06   FVC-Predicted Post % 88   Pre FEV1/FVC % % 80   Post FEV1/FCV % % 82   FEV1-Pre L 1.58   FEV1-Predicted Pre % 92   FEV1-Post L 1.69   DLCO uncorrected ml/min/mmHg 10.84   DLCO UNC% % 61   DLCO corrected ml/min/mmHg 10.84   DLCO COR %Predicted % 61   DLVA Predicted % 79   TLC L 4.48   TLC % Predicted % 94   RV % Predicted % 72   Spirometry reviewed interpreted as normal, no bronchodilator response, lung volumes within normal limits, DLCO cannot be interpreted given lack of repeatability, lack of quality per ATS standards  WALK:      No data to display          Imaging: Personally reviewed and as per EMR discussion this note No results found.  Lab Results: Personally reviewed CBC    Component Value Date/Time   WBC 4.8 11/11/2022 1021   WBC 9.0 07/19/2021 1115   RBC 4.08 11/11/2022 1021   RBC 4.02 07/19/2021 1115  HGB 10.1 (L) 11/11/2022 1021   HCT 33.5 (L) 11/11/2022 1021   PLT 320 11/11/2022 1021   MCV 82 11/11/2022 1021   MCH 24.8 (L) 11/11/2022 1021   MCH 27.6 07/19/2021 1115   MCHC 30.1 (L) 11/11/2022 1021   MCHC 32.4 07/19/2021 1115   RDW 15.2 11/11/2022 1021   LYMPHSABS 0.7 07/19/2021 1115   MONOABS 0.7 07/19/2021 1115   EOSABS 0.1 07/19/2021 1115   BASOSABS 0.1 07/19/2021 1115    BMET    Component Value Date/Time   NA 141 11/11/2022 1021   K 4.2 11/11/2022 1021   CL 104 11/11/2022 1021   CO2 22 11/11/2022 1021   GLUCOSE 97 11/11/2022 1021   GLUCOSE 98 07/30/2021 1011   BUN 15 11/11/2022 1021   CREATININE 0.91 11/11/2022 1021   CALCIUM 9.3 11/11/2022 1021   GFRNONAA >60 07/19/2021 1115    BNP    Component Value Date/Time   BNP 187.4 (H) 07/19/2021 1115    ProBNP    Component Value Date/Time   PROBNP 528 11/11/2022  1021    Specialty Problems       Pulmonary Problems   Mild persistent asthma without complication   Bronchitis   Exertional dyspnea   Rhinosinusitis   Asthma    No Known Allergies  Immunization History  Administered Date(s) Administered   Fluad Quad(high Dose 65+) 05/09/2021   Influenza, High Dose Seasonal PF 04/29/2017, 04/15/2018, 04/25/2019, 05/11/2020   Influenza-Unspecified 05/22/2006, 06/09/2007, 04/15/2008   PFIZER(Purple Top)SARS-COV-2 Vaccination 07/27/2019, 08/17/2019   Pneumococcal Conjugate-13 01/16/2018   Pneumococcal Polysaccharide-23 04/25/2019   Tdap 02/16/2007    Past Medical History:  Diagnosis Date   A-fib (HCC)    Asthma    Blepharitis of upper and lower eyelids of both eyes 07/19/2019   Capsulitis of metatarsophalangeal (MTP) joint of left foot 07/01/2016   Dependent edema 06/20/2019   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/19/2019   Environmental and seasonal allergies 04/15/2018   Essential hypertension 04/15/2018   Exertional dyspnea 06/20/2019   GERD (gastroesophageal reflux disease)    GERD without esophagitis 04/15/2018   High blood pressure    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   Mild persistent asthma without complication 04/15/2018   Peripheral drusen, bilateral 07/19/2019   Postmenopausal bleeding 08/23/2015   Rhinosinusitis 01/11/2020   Screening for colon cancer 08/15/2019   Tachycardia 01/24/2021   TMJ tenderness, right 08/15/2019    Tobacco History: Social History   Tobacco Use  Smoking Status Never   Passive exposure: Never  Smokeless Tobacco Never   Counseling given: Not Answered   Continue to not smoke  Outpatient Encounter Medications as of 04/22/2023  Medication Sig   albuterol (PROVENTIL HFA) 108 (90 Base) MCG/ACT inhaler Inhale two puffs every four to six hours as needed for cough or wheeze.   apixaban (ELIQUIS) 5 MG TABS tablet Take 1 tablet (5 mg total) by mouth in the morning and  at bedtime.   Azelastine HCl (ASTEPRO) 0.15 % SOLN Place 1 spray into both nostrils at bedtime.   BIOTIN FORTE PO Take 1 tablet by mouth daily.   calcium-vitamin D (OSCAL WITH D) 500-200 MG-UNIT tablet Take 1 tablet by mouth daily.   dexlansoprazole (DEXILANT) 60 MG capsule Take 1 capsule (60 mg total) by mouth every morning.   diltiazem (DILT-XR) 120 MG 24 hr capsule Take 1 capsule (120 mg total) by mouth daily.   dronedarone (MULTAQ) 400 MG tablet Take 1 tablet (400 mg  total) by mouth 2 (two) times daily with a meal.   furosemide (LASIX) 20 MG tablet Take 1 tablet (20 mg total) by mouth daily.   Mometasone Furoate (ASMANEX HFA) 200 MCG/ACT AERO Inhale two puffs twice daily to prevent cough or wheeze. Rinse mouth after use.   Multiple Vitamin (MULTI-VITAMIN DAILY PO) Take 1 tablet by mouth daily.   potassium chloride (MICRO-K) 10 MEQ CR capsule TAKE 1 CAPSULE BY MOUTH DAILY.   telmisartan (MICARDIS) 40 MG tablet TAKE 1 TABLET BY MOUTH DAILY   [DISCONTINUED] ALPRAZolam (XANAX) 0.25 MG tablet Take one tablet at bedtime as directed.   No facility-administered encounter medications on file as of 04/22/2023.     Review of Systems  Review of Systems  N/a Physical Exam  BP 114/60 (BP Location: Left Arm, Cuff Size: Normal)   Pulse 74   Ht 5' 2.5" (1.588 m)   Wt 152 lb 3.2 oz (69 kg)   SpO2 98%   BMI 27.39 kg/m   Wt Readings from Last 5 Encounters:  04/22/23 152 lb 3.2 oz (69 kg)  03/16/23 156 lb (70.8 kg)  11/10/22 165 lb (74.8 kg)  09/04/22 162 lb 9.6 oz (73.8 kg)  08/20/22 162 lb 6.4 oz (73.7 kg)    BMI Readings from Last 5 Encounters:  04/22/23 27.39 kg/m  03/16/23 29.00 kg/m  11/10/22 29.70 kg/m  09/04/22 30.23 kg/m  08/20/22 30.19 kg/m     Physical Exam General: well appearing, in NAD Eyes: EOMI, no icterus Neck: supple, no JVP Pulm: Normal work of breathing, clear  CV: Warm, no edema Abd: ND, BS present MSK: No synovitis, No joint effusion Neuro: Normal  gait, no weakness Psych: Normal mood, full affect  Assessment & Plan:    Obstructive sleep apnea: On recent polysomnography at home.  She is using CPAP nightly, auto titrating.   AHI < 5. She is currently benefiting clinically from ongoing CPAP use.  Chronic cough: Present since the 1990s.  Suspect will be exceedingly difficult to treat given duration of symptoms.  Ongoing work-up and treatment per allergist. Not significantly improved on ICS.  Most likely reflux related given her hiatal hernia on imaging.  Especially with lack of response to other therapies.     Return in about 1 year (around 04/21/2024) for f/u Dr. Judeth Horn.   Karren Burly, MD 04/22/2023

## 2023-05-08 NOTE — Progress Notes (Unsigned)
Cardiology Office Note:    Date:  05/11/2023   ID:  Karen Snyder, DOB Jul 21, 1941, MRN [id number]  PCP:  Karen Pen, MD  Cardiologist:  Karen Herrlich, MD    Referring MD: Karen Pen, MD    ASSESSMENT:    1. Atypical atrial flutter (HCC)   2. High risk medication use   3. Chronic anticoagulation   4. Hypertensive heart disease with heart failure (HCC)   5. Hypertensive heart disease without heart failure    PLAN:    In order of problems listed above:  She is doing well tolerates Multaq maintain sinus rhythm and will continue her current anticoagulant Blood pressure is at target with her diuretic and calcium channel blocker   Next appointment: 6 months   Medication Adjustments/Labs and Tests Ordered: Current medicines are reviewed at length with the patient today.  Concerns regarding medicines are outlined above.  Orders Placed This Encounter  Procedures   EKG 12-Lead   No orders of the defined types were placed in this encounter.    History of Present Illness:    Karen Snyder is a 82 y.o. female with a hx of atypical atrial flutter maintaining sinus rhythm with Multaq chronic anticoagulation hypertensive heart disease with heart failure and obstructive sleep apnea on CPAP last seen 11/10/2022 with recurrent hematuria.  Compliance with diet, lifestyle and medications: Yes  Her current problems are primarily GI she is having severe diarrhea even at times fecal incontinence she is iron deficient and she is awaiting upper and lower endoscopy. I gave her the usual instructions to skip her anticoagulant to full days 4 doses of Eliquis prior to procedure usually restart 1 to 2 days afterwards at the discretion of her GI physician She thinks she has lost weight She is not having edema shortness of breath chest pain palpitation or syncope She continues on her Multaq Past Medical History:  Diagnosis Date   A-fib (HCC)    Asthma    Blepharitis of upper and lower  eyelids of both eyes 07/19/2019   Capsulitis of metatarsophalangeal (MTP) joint of left foot 07/01/2016   Dependent edema 06/20/2019   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/19/2019   Environmental and seasonal allergies 04/15/2018   Essential hypertension 04/15/2018   Exertional dyspnea 06/20/2019   GERD (gastroesophageal reflux disease)    GERD without esophagitis 04/15/2018   High blood pressure    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   Mild persistent asthma without complication 04/15/2018   Peripheral drusen, bilateral 07/19/2019   Postmenopausal bleeding 08/23/2015   Rhinosinusitis 01/11/2020   Screening for colon cancer 08/15/2019   Tachycardia 01/24/2021   TMJ tenderness, right 08/15/2019    Current Medications: Current Meds  Medication Sig   albuterol (PROVENTIL HFA) 108 (90 Base) MCG/ACT inhaler Inhale two puffs every four to six hours as needed for cough or wheeze.   apixaban (ELIQUIS) 5 MG TABS tablet Take 1 tablet (5 mg total) by mouth in the morning and at bedtime.   Azelastine HCl (ASTEPRO) 0.15 % SOLN Place 1 spray into both nostrils at bedtime.   BIOTIN FORTE PO Take 1 tablet by mouth daily.   calcium-vitamin D (OSCAL WITH D) 500-200 MG-UNIT tablet Take 1 tablet by mouth daily.   dexlansoprazole (DEXILANT) 60 MG capsule Take 1 capsule (60 mg total) by mouth every morning.   diltiazem (DILT-XR) 120 MG 24 hr capsule Take 1 capsule (120 mg total) by mouth daily.   dronedarone (  MULTAQ) 400 MG tablet Take 1 tablet (400 mg total) by mouth 2 (two) times daily with a meal.   furosemide (LASIX) 20 MG tablet Take 1 tablet (20 mg total) by mouth daily.   Mometasone Furoate (ASMANEX HFA) 200 MCG/ACT AERO Inhale two puffs twice daily to prevent cough or wheeze. Rinse mouth after use.   Multiple Vitamin (MULTI-VITAMIN DAILY PO) Take 1 tablet by mouth daily.   potassium chloride (MICRO-K) 10 MEQ CR capsule TAKE 1 CAPSULE BY MOUTH DAILY.   telmisartan  (MICARDIS) 40 MG tablet TAKE 1 TABLET BY MOUTH DAILY      EKGs/Labs/Other Studies Reviewed:    The following studies were reviewed today:  Cardiac Studies & Procedures       ECHOCARDIOGRAM  ECHOCARDIOGRAM COMPLETE 03/11/2021  Narrative ECHOCARDIOGRAM REPORT    Patient Name:   Karen Snyder Date of Exam: 02/19/2021 Medical Rec #:  [id number]  Height:       61.0 in Accession #:    [id number] Weight:       160.0 lb Date of Birth:  [birth year] BSA:          1.718 m Patient Age:    82 years   BP:           140/90 mmHg Patient Gender: F          HR:           78 bpm. Exam Location:  [facility]  Procedure: 2D Echo  Indications:    PAF (paroxysmal atrial fibrillation) (HCC) [I48.0 (ICD-10-CM)]; SOB (shortness of breath) [R06.02 (ICD-10-CM)]; Hypertension, unspecified type [I10 (ICD-10-CM)]  History:        Patient has no prior history of Echocardiogram examinations. Arrythmias:Tachycardia, Signs/Symptoms:Dependent edema; Risk Factors:Hypertension.  Sonographer:    Karen Snyder RDCS Referring Phys: [id number] KARDIE TOBB  IMPRESSIONS   1. Left ventricular ejection fraction, by estimation, is 60 to 65%. The left ventricle has normal function. The left ventricle has no regional wall motion abnormalities. Left ventricular diastolic parameters are consistent with Grade III diastolic dysfunction (restrictive). The average left ventricular global longitudinal strain is -11.2 %. The global longitudinal strain is abnormal. 2. Right ventricular systolic function is normal. The right ventricular size is normal. There is normal pulmonary artery systolic pressure. 3. Left atrial size was mildly dilated. 4. The mitral valve is normal in structure. Mild mitral valve regurgitation. No evidence of mitral stenosis. 5. The aortic valve is normal in structure. Aortic valve regurgitation is not visualized. Mild aortic valve sclerosis is present, with no evidence of aortic valve stenosis. 6. The inferior vena  cava is normal in size with greater than 50% respiratory variability, suggesting right atrial pressure of 3 mmHg.  FINDINGS Left Ventricle: Left ventricular ejection fraction, by estimation, is 60 to 65%. The left ventricle has normal function. The left ventricle has no regional wall motion abnormalities. The average left ventricular global longitudinal strain is -11.2 %. The global longitudinal strain is abnormal. The left ventricular internal cavity size was normal in size. There is no left ventricular hypertrophy. Left ventricular diastolic parameters are consistent with Grade III diastolic dysfunction (restrictive).  Right Ventricle: The right ventricular size is normal. No increase in right ventricular wall thickness. Right ventricular systolic function is normal. There is normal pulmonary artery systolic pressure. The tricuspid regurgitant velocity is 2.61 m/s, and with an assumed right atrial pressure of 3 mmHg, the estimated right ventricular systolic pressure is 30.2 mmHg.  Left Atrium: Left atrial size was mildly dilated.  Right Atrium: Right atrial size was normal in size.  Pericardium: There is no evidence of pericardial effusion.  Mitral Valve: The mitral valve is normal in structure. Mild mitral valve regurgitation. No evidence of mitral valve stenosis.  Tricuspid Valve: The tricuspid valve is normal in structure. Tricuspid valve regurgitation is mild . No evidence of tricuspid stenosis.  Aortic Valve: The aortic valve is normal in structure. Aortic valve regurgitation is not visualized. Mild aortic valve sclerosis is present, with no evidence of aortic valve stenosis.  Pulmonic Valve: The pulmonic valve was normal in structure. Pulmonic valve regurgitation is not visualized. No evidence of pulmonic stenosis.  Aorta: The aortic arch was not well visualized.  Venous: A systolic blunting flow pattern is recorded from the right upper pulmonary vein. The inferior vena cava is  normal in size with greater than 50% respiratory variability, suggesting right atrial pressure of 3 mmHg.  IAS/Shunts: No atrial level shunt detected by color flow Doppler.   LEFT VENTRICLE PLAX 2D LVIDd:         4.40 cm  Diastology LVIDs:         2.40 cm  LV e' medial:    4.67 cm/s LV PW:         1.00 cm  LV E/e' medial:  30.6 LV IVS:        1.00 cm  LV e' lateral:   5.22 cm/s LVOT diam:     1.80 cm  LV E/e' lateral: 27.4 LV SV:         65 LV SV Index:   38       2D Longitudinal Strain LVOT Area:     2.54 cm 2D Strain GLS Avg:     -11.2 %   RIGHT VENTRICLE             IVC RV S prime:     14.00 cm/s  IVC diam: 1.60 cm TAPSE (M-mode): 2.1 cm  LEFT ATRIUM             Index       RIGHT ATRIUM           Index LA diam:        4.10 cm 2.39 cm/m  RA Area:     12.70 cm LA Vol (A2C):   63.7 ml 37.08 ml/m RA Volume:   29.40 ml  17.11 ml/m LA Vol (A4C):   69.1 ml 40.22 ml/m LA Biplane Vol: 67.3 ml 39.18 ml/m AORTIC VALVE LVOT Vmax:   119.00 cm/s LVOT Vmean:  82.900 cm/s LVOT VTI:    0.256 m  AORTA Ao Root diam: 2.70 cm Ao Asc diam:  2.70 cm Ao Desc diam: 1.70 cm  MITRAL VALVE                TRICUSPID VALVE MV Area (PHT): 4.71 cm     TR Peak grad:   27.2 mmHg MV Decel Time: 161 msec     TR Vmax:        261.00 cm/s MV E velocity: 143.00 cm/s MV A velocity: 44.70 cm/s   SHUNTS MV E/A ratio:  3.20         Systemic VTI:  0.26 m Systemic Diam: 1.80 cm  Karen Herrlich MD Electronically signed by Karen Herrlich MD Signature Date/Time: 03/11/2021/5:48:28 PM    Final            EKG Interpretation Date/Time:  Monday May 11 2023 13:04:37 EDT Ventricular Rate:  70 PR Interval:  156 QRS Duration:  78 QT Interval:  414 QTC Calculation: 447 R Axis:   98  Text Interpretation: Normal sinus rhythm Rightward axis When compared with ECG of 19-Jul-2021 11:24, PREVIOUS ECG IS PRESENT Confirmed by Karen Snyder ([id number]) on 05/11/2023 1:11:31 PM   Recent Labs: 11/11/2022: ALT 11;  BUN 15; Creatinine, Ser 0.91; Hemoglobin 10.1; NT-Pro BNP 528; Platelets 320; Potassium 4.2; Sodium 141; TSH 2.910  Recent Lipid Panel No results found for: "CHOL", "TRIG", "HDL", "CHOLHDL", "VLDL", "LDLCALC", "LDLDIRECT"  Physical Exam:    VS:  BP 122/60   Pulse 70   Ht 5' 2.5" (1.588 m)   Wt 153 lb (69.4 kg)   SpO2 98%   BMI 27.54 kg/m     Wt Readings from Last 3 Encounters:  05/11/23 153 lb (69.4 kg)  04/22/23 152 lb 3.2 oz (69 kg)  03/16/23 156 lb (70.8 kg)     GEN: Well nourished, well developed in no acute distress HEENT: Normal NECK: No JVD; No carotid bruits LYMPHATICS: No lymphadenopathy CARDIAC: RRR, no murmurs, rubs, gallops RESPIRATORY:  Clear to auscultation without rales, wheezing or rhonchi  ABDOMEN: Soft, non-tender, non-distended MUSCULOSKELETAL:  No edema; No deformity  SKIN: Warm and dry NEUROLOGIC:  Alert and oriented x 3 PSYCHIATRIC:  Normal affect    Signed, Karen Herrlich, MD  05/11/2023 1:26 PM    [facility]

## 2023-05-11 ENCOUNTER — Ambulatory Visit: Payer: Medicare PPO | Attending: Cardiology | Admitting: Cardiology

## 2023-05-11 ENCOUNTER — Encounter: Payer: Self-pay | Admitting: Cardiology

## 2023-05-11 VITALS — BP 122/60 | HR 70 | Ht 62.5 in | Wt 153.0 lb

## 2023-05-11 DIAGNOSIS — I484 Atypical atrial flutter: Secondary | ICD-10-CM | POA: Diagnosis not present

## 2023-05-11 DIAGNOSIS — I110 Hypertensive heart disease with heart failure: Secondary | ICD-10-CM | POA: Diagnosis not present

## 2023-05-11 DIAGNOSIS — Z7901 Long term (current) use of anticoagulants: Secondary | ICD-10-CM | POA: Diagnosis not present

## 2023-05-11 DIAGNOSIS — Z79899 Other long term (current) drug therapy: Secondary | ICD-10-CM

## 2023-05-11 MED ORDER — DILTIAZEM HCL ER 120 MG PO CP24: 120.0000 mg | ORAL_CAPSULE | Freq: Every day | ORAL | 3 refills | Status: DC

## 2023-05-11 MED ORDER — TELMISARTAN 40 MG PO TABS: 40.0000 mg | ORAL_TABLET | Freq: Every day | ORAL | 3 refills | Status: DC

## 2023-05-11 MED ORDER — POTASSIUM CHLORIDE ER 10 MEQ PO CPCR: 10.0000 meq | ORAL_CAPSULE | Freq: Every day | ORAL | 3 refills | Status: DC

## 2023-05-11 MED ORDER — FUROSEMIDE 20 MG PO TABS: 20.0000 mg | ORAL_TABLET | Freq: Every day | ORAL | 3 refills | Status: DC

## 2023-05-11 NOTE — Patient Instructions (Signed)
Medication Instructions:  Your physician recommends that you continue on your current medications as directed. Please refer to the Current Medication list given to you today.  *If you need a refill on your cardiac medications before your next appointment, please call your pharmacy*   Lab Work: None If you have labs (blood work) drawn today and your tests are completely normal, you will receive your results only by: MyChart Message (if you have MyChart) OR A paper copy in the mail If you have any lab test that is abnormal or we need to change your treatment, we will call you to review the results.   Testing/Procedures: None   Follow-Up: At [facility], you and your health needs are our priority.  As part of our continuing mission to provide you with exceptional heart care, we have created designated Provider Care Teams.  These Care Teams include your primary Cardiologist (physician) and Advanced Practice Providers (APPs -  Physician Assistants and Nurse Practitioners) who all work together to provide you with the care you need, when you need it.  We recommend signing up for the patient portal called "MyChart".  Sign up information is provided on this After Visit Summary.  MyChart is used to connect with patients for Virtual Visits (Telemedicine).  Patients are able to view lab/test results, encounter notes, upcoming appointments, etc.  Non-urgent messages can be sent to your provider as well.   To learn more about what you can do with MyChart, go to [email or web address].    Your next appointment:   6 month(s)  Provider:   Norman Herrlich, MD    Other Instructions Stop Eliquis 2 full days = 4 doses pre-colonoscopy and Dr. Dan Europe will decide when to restart.

## 2023-06-22 ENCOUNTER — Other Ambulatory Visit: Payer: Self-pay | Admitting: *Deleted

## 2023-06-22 ENCOUNTER — Other Ambulatory Visit: Payer: Self-pay | Admitting: Cardiology

## 2023-06-22 DIAGNOSIS — I484 Atypical atrial flutter: Secondary | ICD-10-CM

## 2023-06-22 MED ORDER — ASMANEX HFA 200 MCG/ACT IN AERO: INHALATION_SPRAY | RESPIRATORY_TRACT | 2 refills | Status: DC

## 2023-06-22 NOTE — Telephone Encounter (Signed)
Prescription refill request for Eliquis received. Indication: Aflutter Last office visit: 05/11/23 [facility])  Scr: 1.03 (12/24/22)  Age: 82 Weight: 69.4kg  Appropriate dose. Refill sent.

## 2023-07-03 ENCOUNTER — Telehealth: Payer: Self-pay | Admitting: Pharmacy Technician

## 2023-07-03 ENCOUNTER — Other Ambulatory Visit (HOSPITAL_COMMUNITY): Payer: Self-pay

## 2023-07-03 ENCOUNTER — Telehealth: Payer: Self-pay | Admitting: Cardiology

## 2023-07-03 NOTE — Telephone Encounter (Signed)
Patient coming in today to request prior auth for her Karie Soda- she was notified by her insurance that it has to be pre approved again in December      [phone] is the best number for the patient     Thank you

## 2023-07-03 NOTE — Telephone Encounter (Signed)
Pharmacy Patient Advocate Encounter   Received notification from Pt Calls Messages that prior authorization for multaq is required/requested.   Insurance verification completed.   The patient is insured through [employer] .   Per test claim: PA required; PA submitted to above mentioned insurance via CoverMyMeds Key/confirmation #/EOC [id number] Status is pending

## 2023-07-03 NOTE — Telephone Encounter (Signed)
Pharmacy Patient Advocate Encounter  Received notification from [facility] that Prior Authorization for multaq has been APPROVED from 07/21/22 to 07/20/24   PA #/Case ID/Reference #: [id number]

## 2023-07-03 NOTE — Telephone Encounter (Signed)
LM to return my call. 

## 2023-07-03 NOTE — Telephone Encounter (Signed)
Patient notified

## 2023-09-16 ENCOUNTER — Encounter: Payer: Self-pay | Admitting: Allergy and Immunology

## 2023-09-16 ENCOUNTER — Ambulatory Visit: Payer: Medicare PPO | Admitting: Allergy and Immunology

## 2023-09-16 VITALS — BP 128/64 | HR 70 | Resp 16

## 2023-09-16 DIAGNOSIS — K219 Gastro-esophageal reflux disease without esophagitis: Secondary | ICD-10-CM | POA: Diagnosis not present

## 2023-09-16 DIAGNOSIS — J4530 Mild persistent asthma, uncomplicated: Secondary | ICD-10-CM

## 2023-09-16 DIAGNOSIS — G4733 Obstructive sleep apnea (adult) (pediatric): Secondary | ICD-10-CM

## 2023-09-16 DIAGNOSIS — J3089 Other allergic rhinitis: Secondary | ICD-10-CM | POA: Diagnosis not present

## 2023-09-16 NOTE — Patient Instructions (Signed)
  1. Continue to Treat reflux:   A.  Dexilant 60 mg 1 time a day in a.m.   2. Continue to Treat inflammation:   A.  Asmanex 200 HFA - 2 inhalations 1-2 time per day (empty lungs)  3. If needed:   A. Albuterol HFA 2 puffs every 4-6 hours  B. OTC antihistamine  C. OTC Mucinex DM  D. Nasal saline  E. OTC Pataday-1 drop each eye 1 time per day  F. Astepro - 1-2 sprays each nostril 1-2 times per day  4.  Influenza = Tamiflu. Covid = Paxlovid  5.  Return to clinic in 6 months or earlier if problem

## 2023-09-16 NOTE — Progress Notes (Unsigned)
 [place] - [place] - [place] - [place] - [name]   Follow-up Note  Referring Provider: Hadley Pen, MD Primary Provider: Hadley Pen, MD Date of Office Visit: 09/16/2023  Subjective:   Karen Snyder (DOB: 08-Jan-1941) is a 83 y.o. female who [facility] in re-evaluation of the following:  HPI: Karen Snyder returns to this clinic in evaluation of asthma, rhinitis, LPR, and a history of hypoxemic respiratory failure and sleep apnea.  I last saw her in this clinic 16 March 2023.  During the interval she has really done well with her airway and has not had any significant problem requiring her to receive a systemic steroid or antibiotic in her use of a short acting bronchodilator is less than 1 time per month.  She can exert herself without any problem and have cold air exposure without any problem.  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  Her reflux is under very good control while using Dexilant.  She has received this years flu vaccine.  She continues on CPAP.  Allergies as of 09/16/2023   No Known Allergies      Medication List    albuterol 108 (90 Base) MCG/ACT inhaler Commonly known as: Proventil HFA Inhale two puffs every four to six hours as needed for cough or wheeze.   Asmanex HFA 200 MCG/ACT Aero Generic drug: Mometasone Furoate Inhale two puffs twice daily to prevent cough or wheeze. Rinse mouth after use.   Astepro 0.15 % Soln Generic drug: Azelastine HCl Place 1 spray into both nostrils at bedtime.   BIOTIN FORTE PO Take 1 tablet by mouth daily.   calcium-vitamin D 500-200 MG-UNIT tablet Commonly known as: OSCAL WITH D Take 1 tablet by mouth daily.   dexlansoprazole 60 MG capsule Commonly known as: Dexilant Take 1 capsule (60 mg total) by mouth every morning.   diltiazem  120 MG 24 hr capsule Commonly known as: Dilt-XR Take 1 capsule (120 mg total) by mouth daily.   Eliquis 5 MG Tabs tablet Generic drug: apixaban TAKE 1 TABLET BY MOUTH EVERY MORNING AND 1 TABLET EVERY NIGHT AT BEDTIME   furosemide 20 MG tablet Commonly known as: LASIX Take 1 tablet (20 mg total) by mouth daily.   Multaq 400 MG tablet Generic drug: dronedarone Take 1 tablet (400 mg total) by mouth 2 (two) times daily with a meal.   MULTI-VITAMIN DAILY PO Take 1 tablet by mouth daily.   potassium chloride 10 MEQ CR capsule Commonly known as: MICRO-K Take 1 capsule (10 mEq total) by mouth daily.   telmisartan 40 MG tablet Commonly known as: MICARDIS Take 1 tablet (40 mg total) by mouth daily.    Past Medical History:  Diagnosis Date   A-fib [facility])    Asthma    Blepharitis of upper and lower eyelids of both eyes 07/19/2019   Capsulitis of metatarsophalangeal (MTP) joint of left foot 07/01/2016   Dependent edema 06/20/2019   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/19/2019   Environmental and seasonal allergies 04/15/2018   Essential hypertension 04/15/2018   Exertional dyspnea 06/20/2019   GERD (gastroesophageal reflux disease)    GERD without esophagitis 04/15/2018   High blood pressure    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   Mild persistent asthma without complication 04/15/2018   Peripheral drusen, bilateral 07/19/2019  Postmenopausal bleeding 08/23/2015   Rhinosinusitis 01/11/2020   Screening for colon cancer 08/15/2019   Tachycardia 01/24/2021   TMJ tenderness, right 08/15/2019    Past Surgical History:  Procedure Laterality Date   ADENOIDECTOMY  1946   HERNIA REPAIR  2016   TONSILLECTOMY  1946    Review of systems negative except as noted in HPI / PMHx or noted below:  Review of Systems  Constitutional: Negative.   HENT: Negative.    Eyes: Negative.   Respiratory: Negative.    Cardiovascular: Negative.   Gastrointestinal:  Negative.   Genitourinary: Negative.   Musculoskeletal: Negative.   Skin: Negative.   Neurological: Negative.   Endo/Heme/Allergies: Negative.   Psychiatric/Behavioral: Negative.       Objective:   Vitals:   09/16/23 1517  BP: 128/64  Pulse: 70  Resp: 16  SpO2: 99%          Physical Exam Constitutional:      Appearance: She is not diaphoretic.  HENT:     Head: Normocephalic.     Right Ear: Tympanic membrane, ear canal and external ear normal.     Left Ear: Tympanic membrane, ear canal and external ear normal.     Nose: Nose normal. No mucosal edema or rhinorrhea.     Mouth/Throat:     Pharynx: Uvula midline. No oropharyngeal exudate.  Eyes:     Conjunctiva/sclera: Conjunctivae normal.  Neck:     Thyroid: No thyromegaly.     Trachea: Trachea normal. No tracheal tenderness or tracheal deviation.  Cardiovascular:     Rate and Rhythm: Normal rate and regular rhythm.     Heart sounds: Normal heart sounds, S1 normal and S2 normal. No murmur heard. Pulmonary:     Effort: No respiratory distress.     Breath sounds: Normal breath sounds. No stridor. No wheezing or rales.  Lymphadenopathy:     Head:     Right side of head: No tonsillar adenopathy.     Left side of head: No tonsillar adenopathy.     Cervical: No cervical adenopathy.  Skin:    Findings: No erythema or rash.     Nails: There is no clubbing.  Neurological:     Mental Status: She is alert.     Diagnostics: Spirometry was performed and demonstrated an FEV1 of 1.21 at 71 % of predicted.  Assessment and Plan:   1. Asthma, well controlled, mild persistent   2. Other allergic rhinitis   3. LPRD (laryngopharyngeal reflux disease)   4. Obstructive sleep apnea syndrome     1. Continue to Treat reflux:   A.  Dexilant 60 mg 1 time a day in a.m.   2. Continue to Treat inflammation:   A.  Asmanex 200 HFA - 2 inhalations 1-2 time per day (empty lungs)  3. If needed:   A. Albuterol HFA 2 puffs every 4-6  hours  B. OTC antihistamine  C. OTC Mucinex DM  D. Nasal saline  E. OTC Pataday-1 drop each eye 1 time per day  F. Astepro - 1-2 sprays each nostril 1-2 times per day  4.  Influenza = Tamiflu. Covid = Paxlovid  5.  Return to clinic in 6 months or earlier if problem  Rona is doing very well while using anti-inflammatory agents for her airway and also addressing the issue of LPR with a proton pump inhibitor and we are going to continue her on this plan and she has a selection of agents to be utilized should  they be required as noted above.  I will see her back in this clinic in 6 months or earlier if there is a problem.   Laurette Schimke, MD Allergy / Immunology [facility]

## 2023-09-17 ENCOUNTER — Encounter: Payer: Self-pay | Admitting: Allergy and Immunology

## 2023-10-12 ENCOUNTER — Other Ambulatory Visit: Payer: Self-pay | Admitting: Allergy and Immunology

## 2023-10-14 IMAGING — CT CT CHEST HIGH RESOLUTION
2 of 5 series · 14 of 36 positions shown, 17 images · non-contrast
Comparison: 01/19/2021 chest radiograph.

CLINICAL DATA: Dyspnea, on oxygen therapy. Chronic cough. Concern
for amiodarone pulmonary toxicity.

EXAM:
CT CHEST WITHOUT CONTRAST
TECHNIQUE: Multidetector CT imaging of the chest was performed following the
standard protocol without intravenous contrast. High resolution
imaging of the lungs, as well as inspiratory and expiratory imaging,
was performed.

[Series 4: thorax · axial · 0.69mm/px · z∈[-307,-59]mm · 11 of 144 slices shown, 14 images]
[im 13/144  mediastinal]
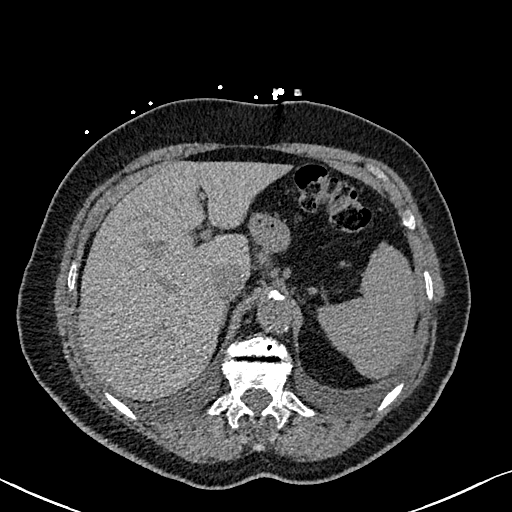
[im 13/144  lung]
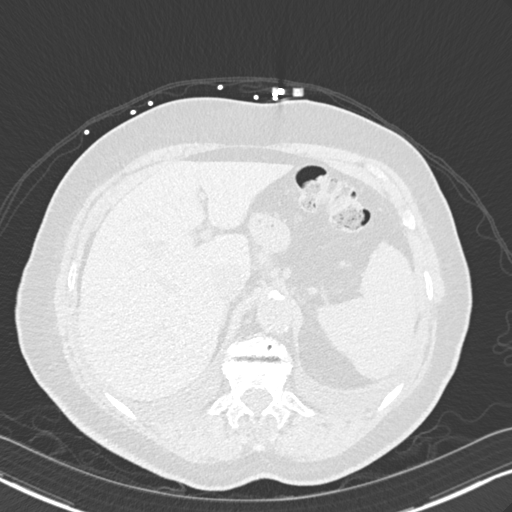
[im 25/144  lung]
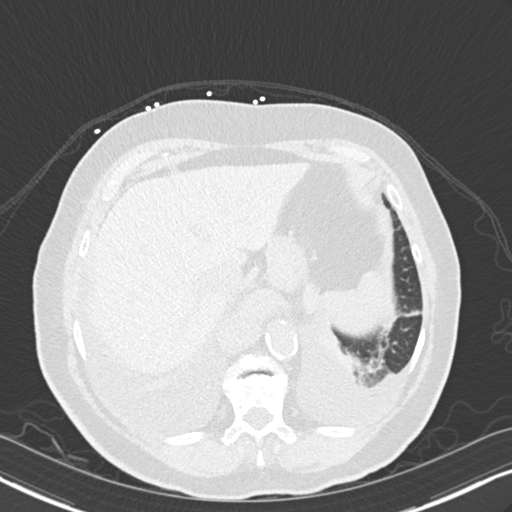
[im 38/144  lung]
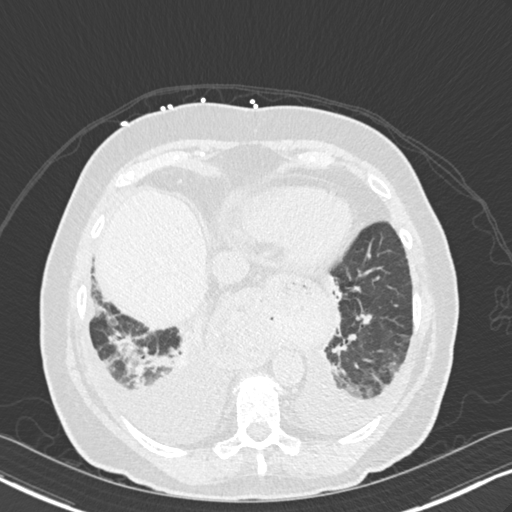
[im 50/144  lung]
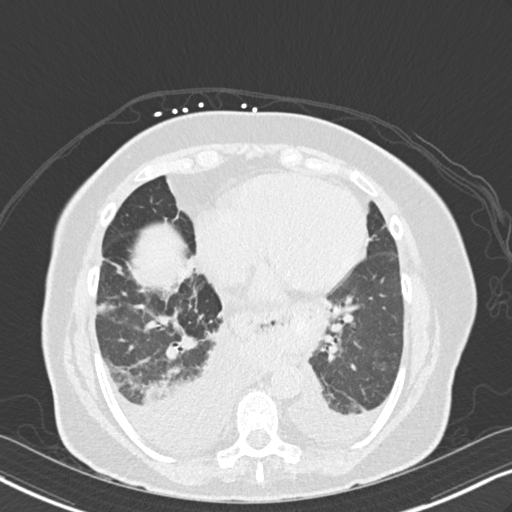
[im 63/144  mediastinal]
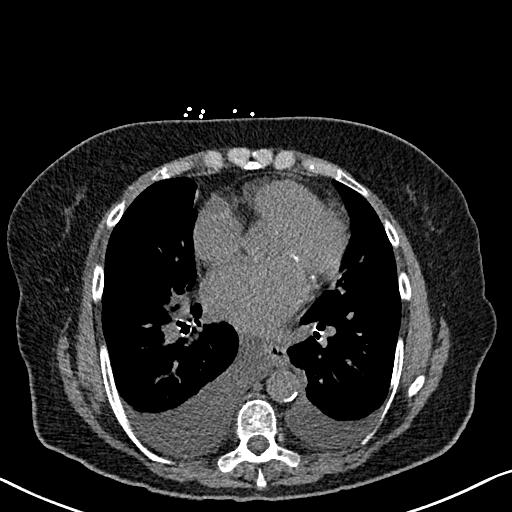
[im 63/144  lung]
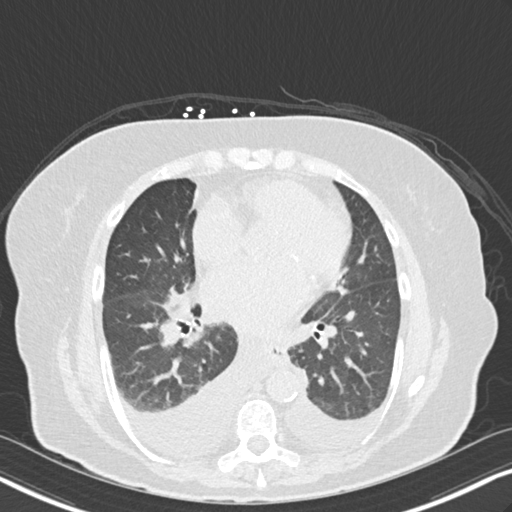
[im 75/144  lung]
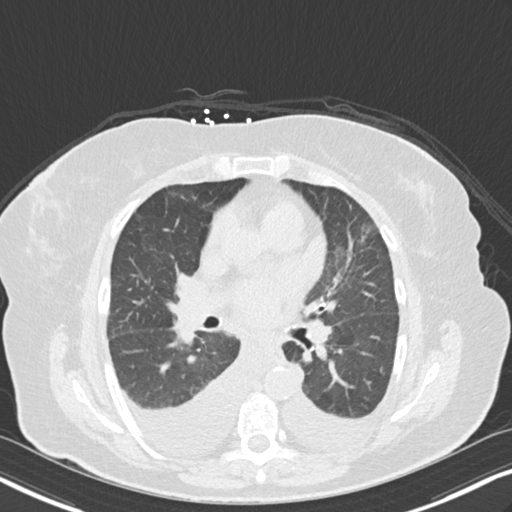
[im 88/144  lung]
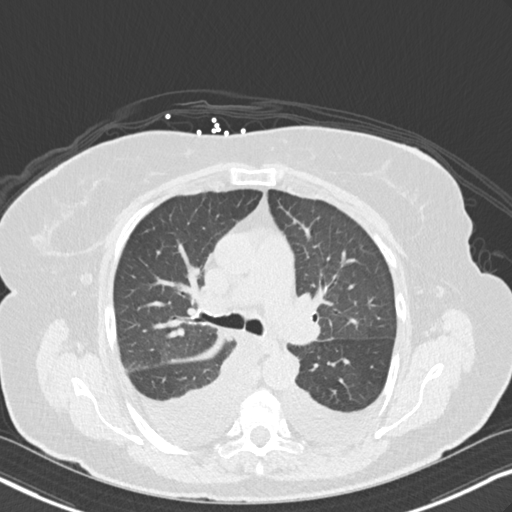
[im 100/144  lung]
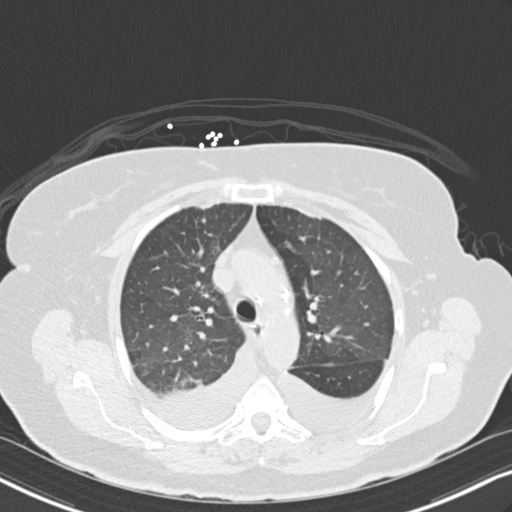
[im 112/144  mediastinal]
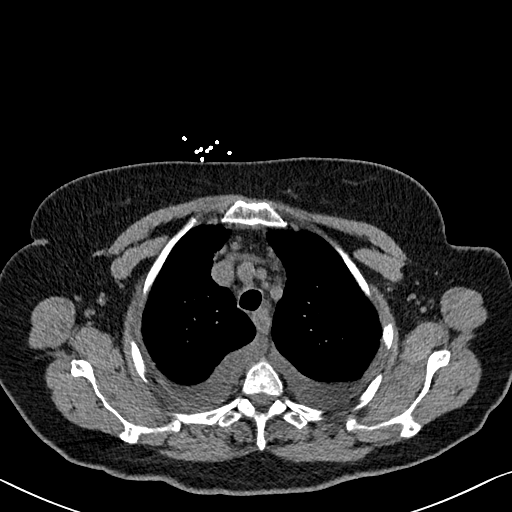
[im 112/144  lung]
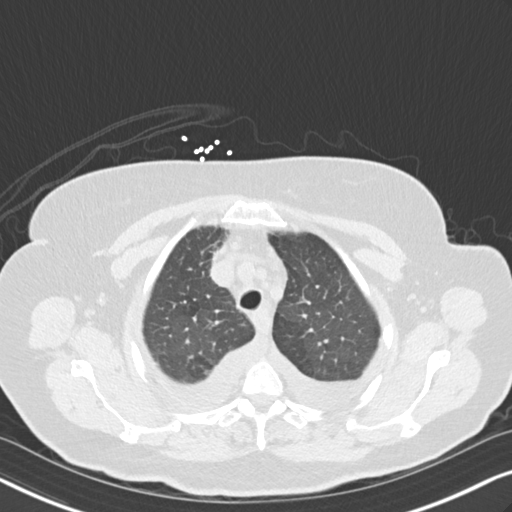
[im 125/144  lung]
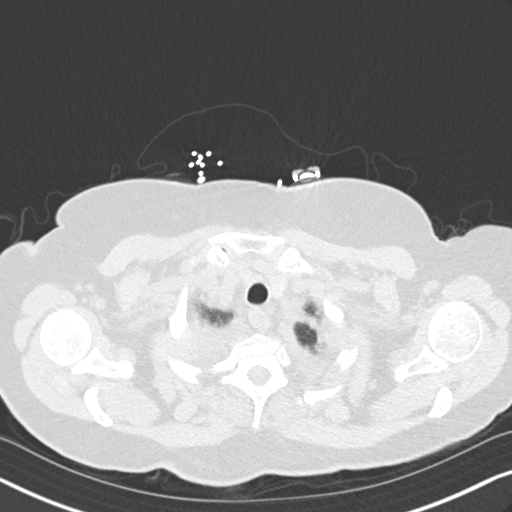
[im 137/144  lung]
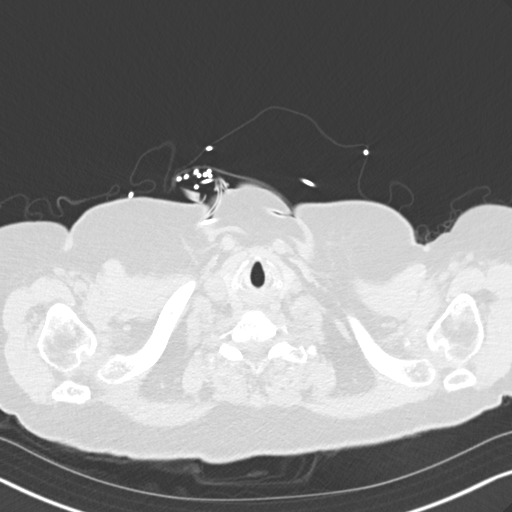

[Series 8: coronal · coronal · 0.57mm/px · 3 of 151 slices shown]
[im 31/151  lung]
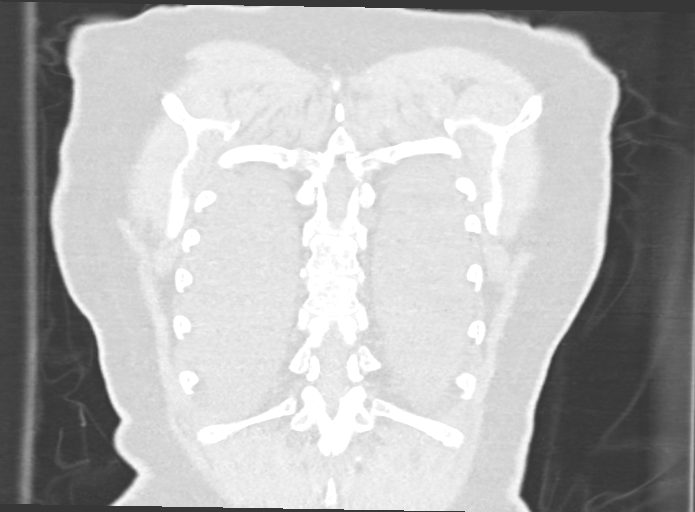
[im 61/151  lung]
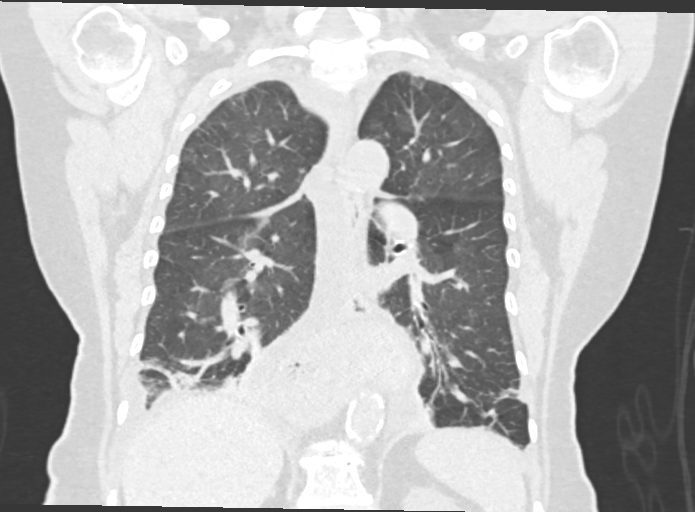
[im 91/151  lung]
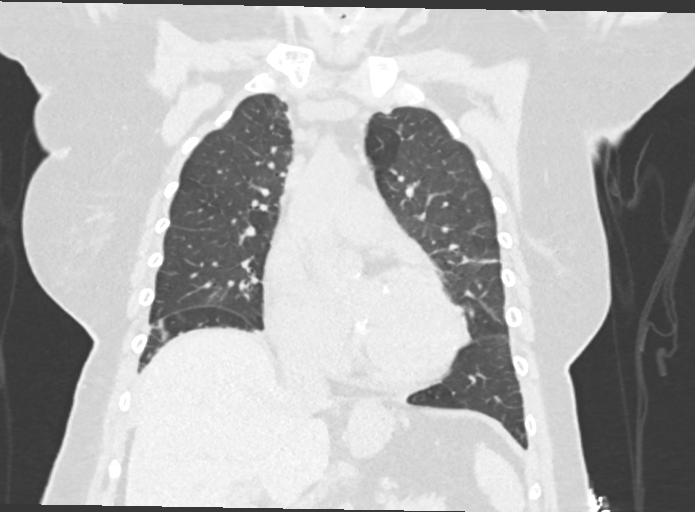

[14 of 36 positions shown; findings below may reference images not displayed]

FINDINGS: Cardiovascular: Top-normal heart size. No significant pericardial
effusion/thickening. Left anterior descending and left circumflex
coronary atherosclerosis. Atherosclerotic nonaneurysmal thoracic
aorta. Normal caliber pulmonary arteries.

Mediastinum/Nodes: No discrete thyroid nodules. Unremarkable
esophagus. No axillary adenopathy. Mildly enlarged 1.1 cm subcarinal
node (series 4/image 66). No discrete hilar adenopathy on these
noncontrast images.

Lungs/Pleura: No pneumothorax. Small dependent bilateral pleural
effusions, right greater than left. Solid anterior right upper lobe
0.4 cm pulmonary nodule (series 6/image 39). No lung masses or
additional significant pulmonary nodules. Mild patchy air trapping
in both lungs on the expiration sequence, without evidence of
tracheobronchomalacia. Mild platelike consolidation in the dependent
and basilar lower lobes bilaterally, right greater than left, with
some associated volume loss, favoring passive atelectasis. Patchy
subpleural reticulation and ground-glass opacity in the bilateral
lower lobes and scattered mild interlobular septal thickening in
both lungs. No significant regions of traction bronchiectasis,
architectural distortion or frank honeycombing.

Upper abdomen: Large hiatal hernia.

Musculoskeletal: No aggressive appearing focal osseous lesions.
Marked lower thoracic degenerative disc disease.
IMPRESSION: 1. Small dependent bilateral pleural effusions, right greater than
left, with associated mild passive atelectasis in the lower lobes
bilaterally, significantly limiting evaluation for an underlying
interstitial lung disease.
2. Patchy subpleural reticulation and ground-glass opacity in the
bilateral lower lobes. No significant regions of traction
bronchiectasis or frank honeycombing. Findings are nonspecific and
could be due to hypoventilatory change, with a mild interstitial
lung disease such as nonspecific interstitial pneumonia (NSIP) or
early usual interstitial pneumonia (UIP) not excluded. Consider
follow-up outpatient high-resolution chest CT study in 3-6 months to
assess temporal pattern stability, as clinically warranted.
3. Solid 0.4 cm right upper lobe pulmonary nodule. No follow-up
needed if patient is low-risk. Non-contrast chest CT can be
considered in 12 months if patient is high-risk. This recommendation
follows the consensus statement: Guidelines for Management of
Incidental Pulmonary Nodules Detected on CT Images: From the
4. Mild patchy air trapping in both lungs, indicative of small
airways disease.
5. Two-vessel coronary atherosclerosis.
6. Large hiatal hernia.
7. Mild mediastinal lymphadenopathy, nonspecific, potentially
reactive.
8. Aortic Atherosclerosis ([id number]-GPR.R).

## 2023-10-16 ENCOUNTER — Other Ambulatory Visit: Payer: Self-pay | Admitting: Cardiology

## 2023-10-16 ENCOUNTER — Telehealth: Payer: Self-pay | Admitting: Cardiology

## 2023-10-16 MED ORDER — MULTAQ 400 MG PO TABS: 400.0000 mg | ORAL_TABLET | Freq: Two times a day (BID) | ORAL | 2 refills | Status: DC

## 2023-10-16 NOTE — Telephone Encounter (Signed)
 Patient is requesting a 90 day supply of Multaq sent to the [facility] on [address] CB # [phone]

## 2023-10-16 NOTE — Telephone Encounter (Signed)
 Pt's medication was sent to pt's pharmacy as requested. Confirmation received.

## 2023-10-23 ENCOUNTER — Ambulatory Visit: Payer: Medicare PPO | Admitting: Cardiology

## 2023-10-23 ENCOUNTER — Ambulatory Visit

## 2023-10-23 VITALS — BP 122/58 | HR 74 | Ht 63.0 in | Wt 153.6 lb

## 2023-10-23 DIAGNOSIS — I340 Nonrheumatic mitral (valve) insufficiency: Secondary | ICD-10-CM | POA: Insufficient documentation

## 2023-10-23 DIAGNOSIS — I10 Essential (primary) hypertension: Secondary | ICD-10-CM

## 2023-10-23 DIAGNOSIS — I480 Paroxysmal atrial fibrillation: Secondary | ICD-10-CM

## 2023-10-23 DIAGNOSIS — Z79899 Other long term (current) drug therapy: Secondary | ICD-10-CM

## 2023-10-23 HISTORY — DX: Nonrheumatic mitral (valve) insufficiency: I34.0

## 2023-10-23 HISTORY — DX: Other long term (current) drug therapy: Z79.899

## 2023-10-23 NOTE — Patient Instructions (Signed)
 Medication Instructions:  Your physician recommends that you continue on your current medications as directed. Please refer to the Current Medication list given to you today.  *If you need a refill on your cardiac medications before your next appointment, please call your pharmacy*  Lab Work: None If you have labs (blood work) drawn today and your tests are completely normal, you will receive your results only by: MyChart Message (if you have MyChart) OR A paper copy in the mail If you have any lab test that is abnormal or we need to change your treatment, we will call you to review the results.  Testing/Procedures: Your physician has requested that you have an echocardiogram. Echocardiography is a painless test that uses sound waves to create images of your heart. It provides your doctor with information about the size and shape of your heart and how well your heart's chambers and valves are working. This procedure takes approximately one hour. There are no restrictions for this procedure. Please do NOT wear cologne, perfume, aftershave, or lotions (deodorant is allowed). Please arrive 15 minutes prior to your appointment time.  Please note: We ask at that you not bring children with you during ultrasound (echo/ vascular) testing. Due to room size and safety concerns, children are not allowed in the ultrasound rooms during exams. Our front office staff cannot provide observation of children in our lobby area while testing is being conducted. An adult accompanying a patient to their appointment will only be allowed in the ultrasound room at the discretion of the ultrasound technician under special circumstances. We apologize for any inconvenience.   Follow-Up: At [facility], you and your health needs are our priority.  As part of our continuing mission to provide you with exceptional heart care, our providers are all part of one team.  This team includes your primary Cardiologist  (physician) and Advanced Practice Providers or APPs (Physician Assistants and Nurse Practitioners) who all work together to provide you with the care you need, when you need it.  Your next appointment:   5 month(s)  Provider:   Huntley Dec, MD    We recommend signing up for the patient portal called "MyChart".  Sign up information is provided on this After Visit Summary.  MyChart is used to connect with patients for Virtual Visits (Telemedicine).  Patients are able to view lab/test results, encounter notes, upcoming appointments, etc.  Non-urgent messages can be sent to your provider as well.   To learn more about what you can do with MyChart, go to [email or web address].   Other Instructions None

## 2023-10-23 NOTE — Assessment & Plan Note (Signed)
 Chronic history of atrial fibrillation/atypical atrial flutter, on rhythm control with Multaq.  Tolerating well. On anticoagulation with Eliquis 5 mg twice daily.  Tolerating well no complications. CHA2DS2-VASc score 4. EKG today confirms sinus rhythm with normal QTc.  Continue current plan.

## 2023-10-23 NOTE — Assessment & Plan Note (Signed)
 Prior echocardiogram was in 2022. No further follow-up. Will assess for any interval change in MR with transthoracic echocardiogram.

## 2023-10-23 NOTE — Assessment & Plan Note (Signed)
 Well-controlled. Target below 130/80 mmHg. Continue with telmisartan 40 mg once daily and diltiazem 120 mg once daily. Remains on furosemide 20 mg once daily given her chronic bilateral pedal edema with seems to be well-controlled. Advised her about salt restriction to below 2 g/day.

## 2023-10-23 NOTE — Progress Notes (Signed)
 Cardiology Consultation:    Date:  10/23/2023   ID:  Karen Snyder, DOB 07-Dec-1940, MRN [id number]  PCP:  Karen Pen, MD  Cardiologist:  Karen Corporal Medford Staheli, MD   Referring MD: Karen Pen, MD   No chief complaint on file.    ASSESSMENT AND PLAN:   Ms. Karen Snyder 83 year old woman atrial relation/atypical atrial flutter on rhythm control with Multaq and on anticoagulation, mild MR, hypertensive heart disease with heart failure, obstructive sleep apnea-uses CPAP, recurrent hematuria follows up with urologist.  Doing well here for routine follow-up.  Problem List Items Addressed This Visit     Essential hypertension   Well-controlled. Target below 130/80 mmHg. Continue with telmisartan 40 mg once daily and diltiazem 120 mg once daily. Remains on furosemide 20 mg once daily given her chronic bilateral pedal edema with seems to be well-controlled. Advised her about salt restriction to below 2 g/day.       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  Tolerating well. On anticoagulation with Eliquis 5 mg twice daily.  Tolerating well no complications. CHA2DS2-VASc score 4. EKG today confirms sinus rhythm with normal QTc.  Continue current plan.       Relevant Orders   EKG 12-Lead (Completed)   Mild mitral regurgitation   Prior echocardiogram was in 2022. No further follow-up. Will assess for any interval change in MR with transthoracic echocardiogram.      High risk medication use   Relevant Orders   EKG 12-Lead (Completed)   Return to the tentatively in 4 months.   History of Present Illness:    Karen Snyder is a 83 y.o. female who is being seen today for follow-up visit. PCP is Karen Pen, MD. Last visit with [place] in the office was 05-11-2023 with Dr. Dulce Snyder. Previously seen by Dr. Elberta Snyder, ultra physiologist in 2022  History of atrial fibrillation/atypical  atrial flutter on rhythm control with Multaq and on anticoagulation, hypertensive heart disease with heart failure, obstructive sleep apnea-uses CPAP, recurrent hematuria secondary to bladder polyp s/p excision and continues to follow-up with urologist Dr. Saddie Snyder, mild MR.  Last echocardiogram available to review is from August 2022 LVEF 60 to 65%, grade 3 diastolic dysfunction reported with GLS -11.2%, normal RV size and function, mildly dilated left atrium, mild MR.  Here for the visit today by herself. Lives with her husband [married to him for the past 11 years as her first husband passed away].  Mentions she does not routinely exercise or keep herself active at home but does at times walk on the treadmill and at times outdoors.  Most limiting factor tends to be her back pain.  Denies any significant symptoms of chest pain, shortness of breath, orthopnea, paroxysmal nocturnal dyspnea. Denies any blood in urine or stools. Has mild bilateral pitting pedal edema for years and has been on furosemide 20 mg once a day.  EKG in the clinic today shows sinus rhythm with heart rate 69/min, PR interval normal 152 ms, QRS duration 78 ms, QTc normal 437 ms. Last EKG available to review is from 05-11-2023 which showed sinus rhythm with heart rate 70/min and QTc 447 ms.   Past Medical History:  Diagnosis Date   A-fib [facility])    Asthma    Atrial fibrillation with RVR (HCC) 02/04/2021   Blepharitis of upper and lower eyelids of both eyes 07/19/2019   Bronchitis 06/20/2019   Capsulitis of metatarsophalangeal (  MTP) joint of left foot 07/01/2016   Dependent edema 06/20/2019   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/19/2019   Environmental and seasonal allergies 04/15/2018   Essential hypertension 04/15/2018   Exertional dyspnea 06/20/2019   GERD (gastroesophageal reflux disease)    GERD without esophagitis 04/15/2018   High blood pressure    Lumbar back pain 04/04/2021   Meibomian gland dysfunction  (MGD) of both eyes 07/19/2019   Mild persistent asthma without complication 04/15/2018   Peripheral drusen, bilateral 07/19/2019   Postmenopausal bleeding 08/23/2015   Rhinosinusitis 01/11/2020   Screening for colon cancer 08/15/2019   Tachycardia 01/24/2021   TMJ tenderness, right 08/15/2019    Past Surgical History:  Procedure Laterality Date   ADENOIDECTOMY  1946   HERNIA REPAIR  2016   TONSILLECTOMY  1946    Current Medications: Current Meds  Medication Sig   albuterol (PROVENTIL HFA) 108 (90 Base) MCG/ACT inhaler Inhale two puffs every four to six hours as needed for cough or wheeze.   Azelastine HCl (ASTEPRO) 0.15 % SOLN Place 1 spray into both nostrils at bedtime.   BIOTIN FORTE PO Take 1 tablet by mouth daily.   calcium-vitamin D (OSCAL WITH D) 500-200 MG-UNIT tablet Take 1 tablet by mouth daily.   dexlansoprazole (DEXILANT) 60 MG capsule TAKE 1 CAPSULE(60 MG) BY MOUTH EVERY MORNING   diltiazem (DILT-XR) 120 MG 24 hr capsule Take 1 capsule (120 mg total) by mouth daily.   dronedarone (MULTAQ) 400 MG tablet Take 1 tablet (400 mg total) by mouth 2 (two) times daily with a meal.   ELIQUIS 5 MG TABS tablet TAKE 1 TABLET BY MOUTH EVERY MORNING AND 1 TABLET EVERY NIGHT AT BEDTIME   furosemide (LASIX) 20 MG tablet Take 1 tablet (20 mg total) by mouth daily.   Mometasone Furoate (ASMANEX HFA) 200 MCG/ACT AERO Inhale two puffs twice daily to prevent cough or wheeze. Rinse mouth after use.   Multiple Vitamin (MULTI-VITAMIN DAILY PO) Take 1 tablet by mouth daily.   potassium chloride (MICRO-K) 10 MEQ CR capsule Take 1 capsule (10 mEq total) by mouth daily.   telmisartan (MICARDIS) 40 MG tablet Take 1 tablet (40 mg total) by mouth daily.     Allergies:   Patient has no known allergies.   Social History   Socioeconomic History   Marital status: Married    Spouse name: Not on file   Number of children: Not on file   Years of education: Not on file   Highest education level: Not  on file  Occupational History   Not on file  Tobacco Use   Smoking status: Never    Passive exposure: Never   Smokeless tobacco: Never  Vaping Use   Vaping status: Never Used  Substance and Sexual Activity   Alcohol use: No   Drug use: No   Sexual activity: Not on file  Other Topics Concern   Not on file  Social History Narrative   Not on file   Social Drivers of Health   Financial Resource Strain: Not on file  Food Insecurity: Not on file  Transportation Needs: Not on file  Physical Activity: Not on file  Stress: Not on file  Social Connections: Not on file     Family History: The patient's family history includes Atrial fibrillation in her mother; Eczema in her sister; Heart Problems in her maternal grandfather and maternal grandmother; Heart attack in her father; Hypertension in her father and mother. ROS:   Please  see the history of present illness.    All 14 point review of systems negative except as described per history of present illness.  EKGs/Labs/Other Studies Reviewed:    The following studies were reviewed today:   EKG:  EKG Interpretation Date/Time:  Friday October 23 2023 14:48:38 EDT Ventricular Rate:  69 PR Interval:  152 QRS Duration:  78 QT Interval:  408 QTC Calculation: 437 R Axis:   106  Text Interpretation: Normal sinus rhythm Rightward axis When compared with ECG of 11-May-2023 13:04, No significant change was found Confirmed by Huntley Dec reddy [phone]) on 10/23/2023 2:51:23 PM    Recent Labs: 11/11/2022: ALT 11; BUN 15; Creatinine, Ser 0.91; Hemoglobin 10.1; NT-Pro BNP 528; Platelets 320; Potassium 4.2; Sodium 141; TSH 2.910  Recent Lipid Panel No results found for: "CHOL", "TRIG", "HDL", "CHOLHDL", "VLDL", "LDLCALC", "LDLDIRECT"  Physical Exam:    VS:  BP (!) 122/58   Pulse 74   Ht 5\' 3"  (1.6 m)   Wt 153 lb 9.6 oz (69.7 kg)   SpO2 94%   BMI 27.21 kg/m     Wt Readings from Last 3 Encounters:  10/23/23 153 lb 9.6 oz (69.7  kg)  05/11/23 153 lb (69.4 kg)  04/22/23 152 lb 3.2 oz (69 kg)     GENERAL:  Well nourished, well developed in no acute distress NECK: No JVD; No carotid bruits CARDIAC: RRR, S1 and S2 present, no murmurs, no rubs, no gallops CHEST:  Clear to auscultation without rales, wheezing or rhonchi  Extremities: No pitting pedal edema. Pulses bilaterally symmetric with radial 2+ and dorsalis pedis 2+ NEUROLOGIC:  Alert and oriented x 3  Medication Adjustments/Labs and Tests Ordered: Current medicines are reviewed at length with the patient today.  Concerns regarding medicines are outlined above.  Orders Placed This Encounter  Procedures   EKG 12-Lead   No orders of the defined types were placed in this encounter.   Signed, Cecille Amsterdam, MD, MPH, [facility] 10/23/2023 2:53 PM    [facility]

## 2023-10-25 IMAGING — DX DG CHEST 2V
2 series · 2 of 2 positions shown · non-contrast
Comparison: Chest x-ray 07/03/2021.

CLINICAL DATA: 80-year-old female with history of hypoxemia.

EXAM:
CHEST - 2 VIEW

[chest pa]
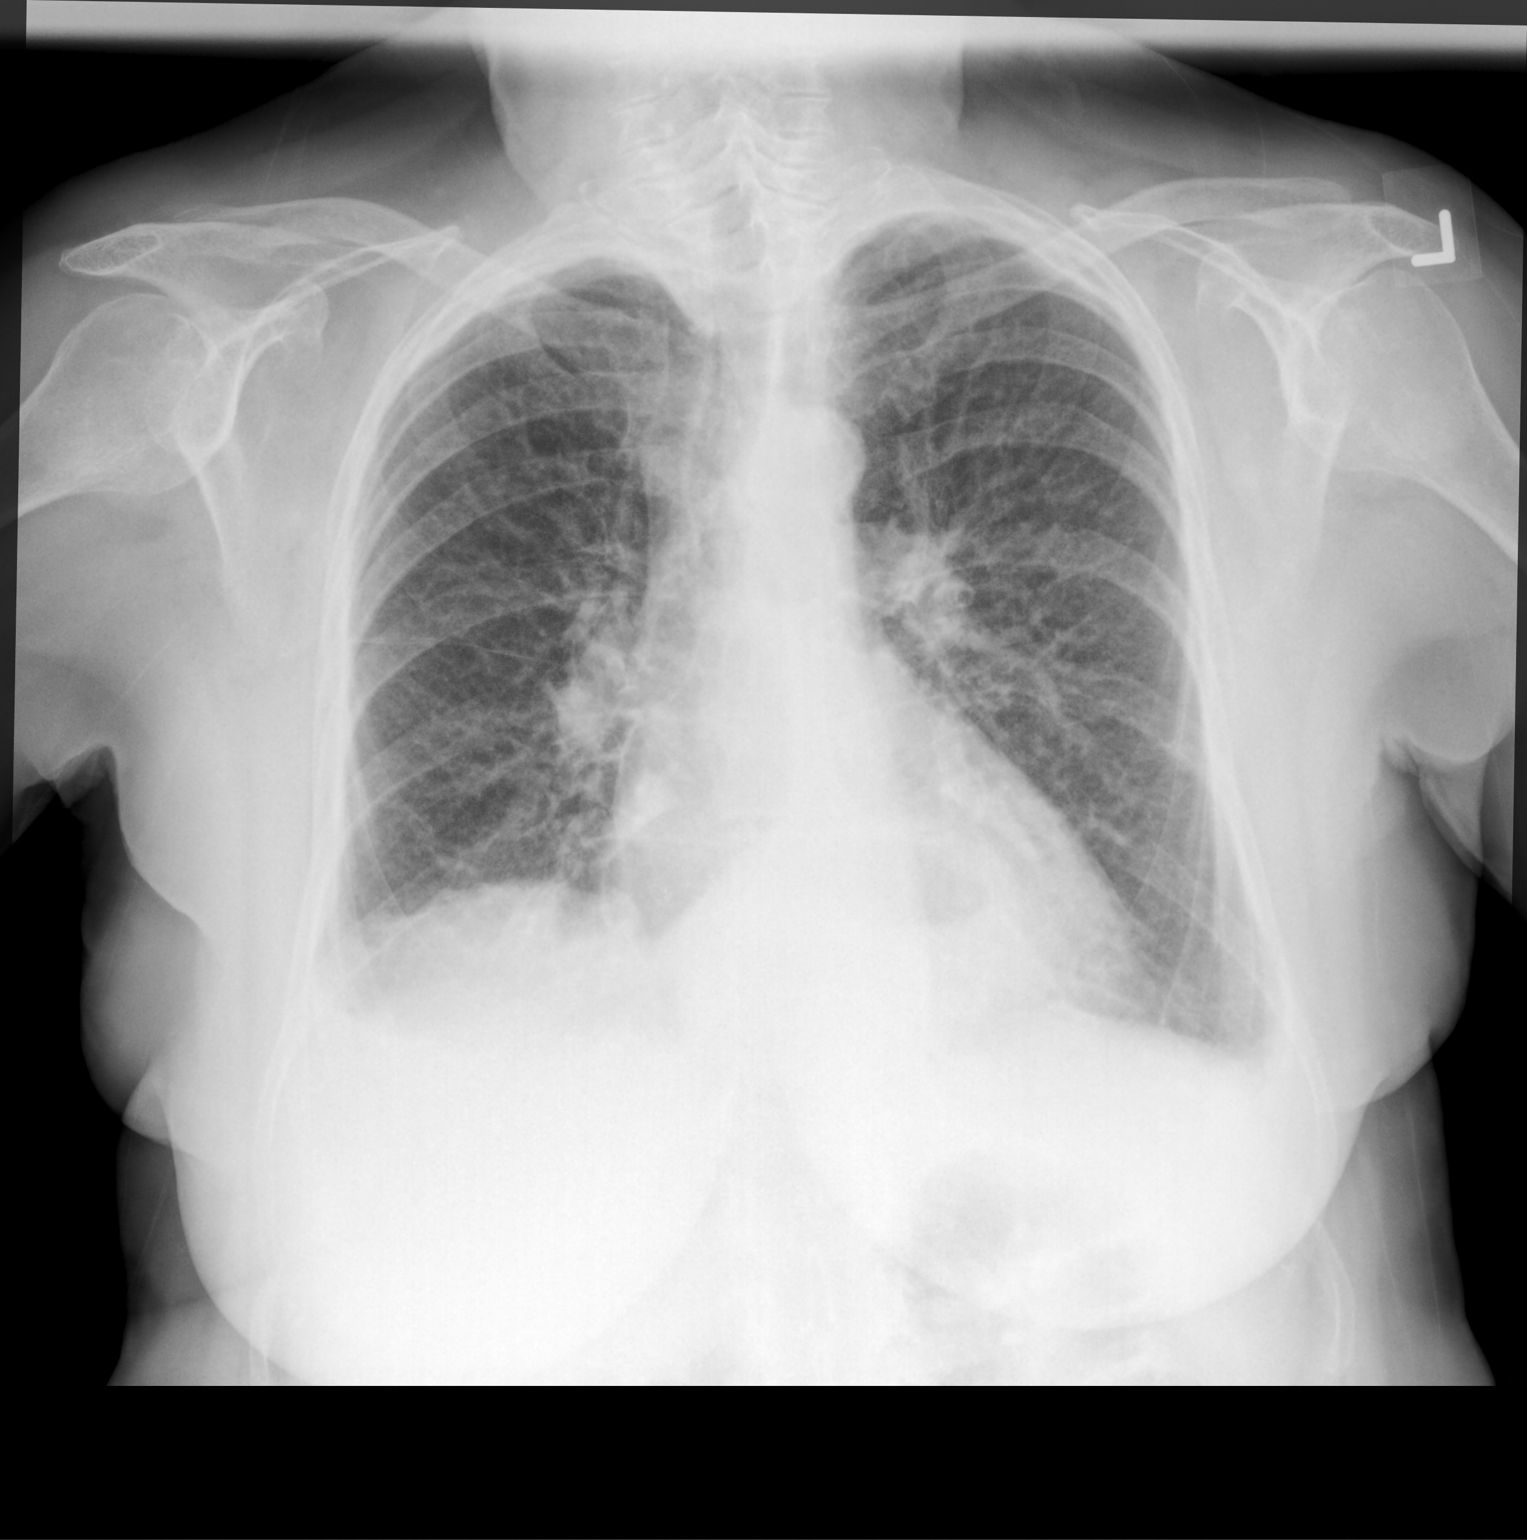

[chest lat]
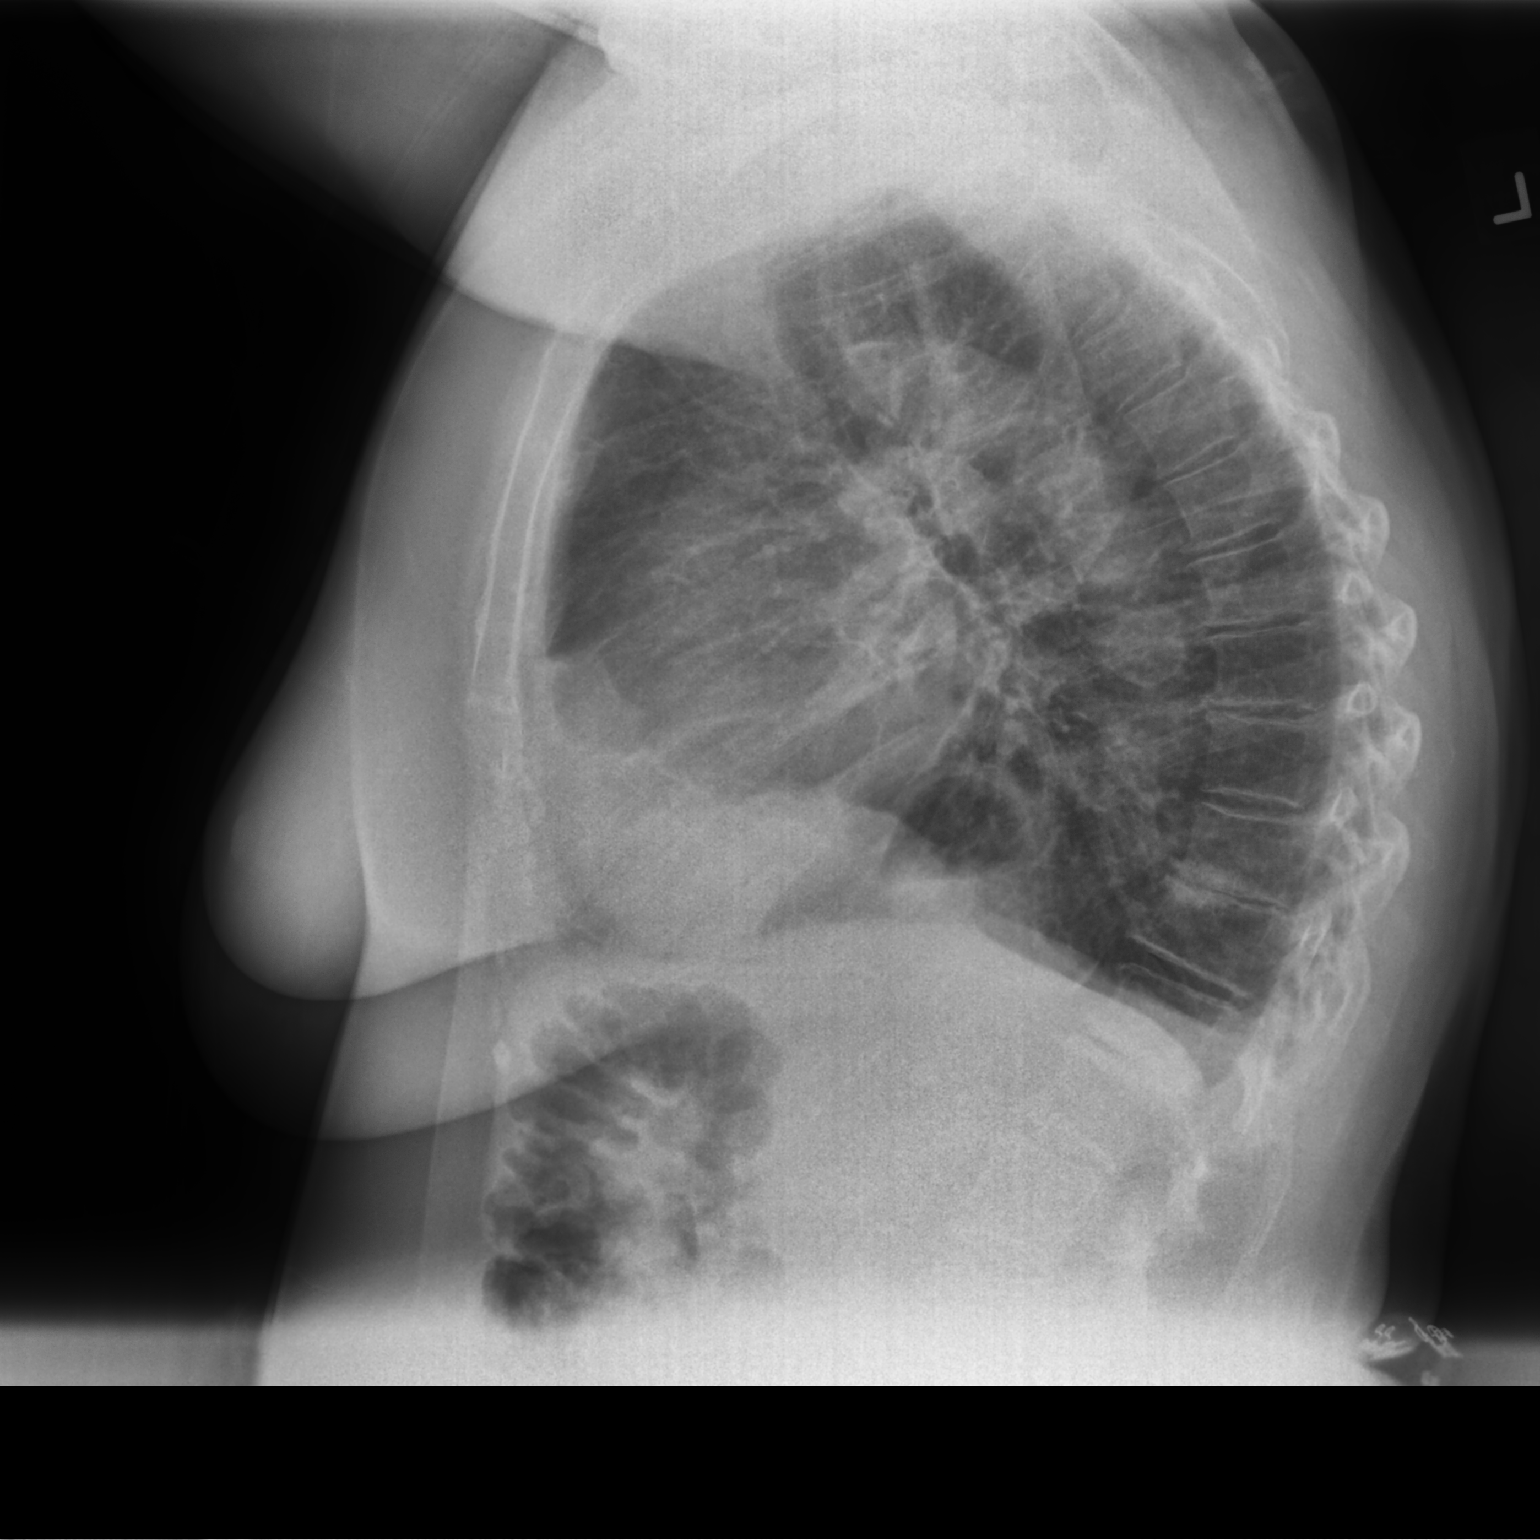

[2 of 2 positions shown; findings below may reference images not displayed]

FINDINGS: Lung volumes are normal. Diffuse peribronchial cuffing. No
consolidative airspace disease. Mild blunting of the costophrenic
sulci bilaterally suggesting trace bilateral pleural effusions
versus chronic pleuroparenchymal scarring. No pneumothorax. No
pulmonary nodule or mass noted. Large hiatal hernia. Pulmonary
vasculature and the cardiomediastinal silhouette are otherwise
within normal limits. Atherosclerosis in the thoracic aorta.
IMPRESSION: 1. Trace bilateral pleural effusions versus chronic areas of
pleuroparenchymal scarring.
2. Diffuse peribronchial cuffing which could suggest an acute or
chronic bronchitis.
3. Large hiatal hernia.

## 2023-11-17 ENCOUNTER — Ambulatory Visit

## 2023-11-17 DIAGNOSIS — I480 Paroxysmal atrial fibrillation: Secondary | ICD-10-CM

## 2023-11-17 DIAGNOSIS — I10 Essential (primary) hypertension: Secondary | ICD-10-CM

## 2023-11-17 DIAGNOSIS — I340 Nonrheumatic mitral (valve) insufficiency: Secondary | ICD-10-CM | POA: Diagnosis not present

## 2023-11-17 DIAGNOSIS — Z79899 Other long term (current) drug therapy: Secondary | ICD-10-CM | POA: Diagnosis not present

## 2023-11-17 LAB — ECHOCARDIOGRAM COMPLETE
Area-P 1/2: 3.21 cm2
MV M vel: 4.52 m/s
MV Peak grad: 81.7 mmHg
Radius: 0.3 cm
S' Lateral: 2.4 cm

## 2023-12-28 ENCOUNTER — Other Ambulatory Visit: Payer: Self-pay | Admitting: Cardiology

## 2023-12-28 DIAGNOSIS — I484 Atypical atrial flutter: Secondary | ICD-10-CM

## 2023-12-28 MED ORDER — APIXABAN 5 MG PO TABS: 5.0000 mg | ORAL_TABLET | Freq: Two times a day (BID) | ORAL | 2 refills | Status: AC

## 2023-12-28 NOTE — Telephone Encounter (Signed)
 Prescription refill request for Eliquis  received. Indication:afib Last office visit:4/25 Scr:1.03  6/24 Age: 83 Weight:69.7  kg  Prescription refilled

## 2024-03-14 ENCOUNTER — Ambulatory Visit: Attending: Cardiology | Admitting: Cardiology

## 2024-03-14 ENCOUNTER — Encounter: Payer: Self-pay | Admitting: Cardiology

## 2024-03-14 VITALS — BP 132/70 | HR 130 | Ht 62.0 in | Wt 149.2 lb

## 2024-03-14 DIAGNOSIS — I10 Essential (primary) hypertension: Secondary | ICD-10-CM | POA: Diagnosis not present

## 2024-03-14 DIAGNOSIS — Z7901 Long term (current) use of anticoagulants: Secondary | ICD-10-CM

## 2024-03-14 DIAGNOSIS — I480 Paroxysmal atrial fibrillation: Secondary | ICD-10-CM

## 2024-03-14 DIAGNOSIS — I484 Atypical atrial flutter: Secondary | ICD-10-CM | POA: Diagnosis not present

## 2024-03-14 DIAGNOSIS — Z79899 Other long term (current) drug therapy: Secondary | ICD-10-CM | POA: Diagnosis not present

## 2024-03-14 MED ORDER — METOPROLOL TARTRATE 25 MG PO TABS: 25.0000 mg | ORAL_TABLET | Freq: Two times a day (BID) | ORAL | 3 refills | Status: DC

## 2024-03-14 NOTE — Patient Instructions (Signed)
 Medication Instructions:  Your physician has recommended you make the following change in your medication:   START: Lopressor  25 mg twice daily for heart rate greater than 110 bpm.  *If you need a refill on your cardiac medications before your next appointment, please call your pharmacy*  Lab Work: Your physician recommends that you return for lab work in:   Labs today: CBC, BMP, Pro BNP  If you have labs (blood work) drawn today and your tests are completely normal, you will receive your results only by: MyChart Message (if you have MyChart) OR A paper copy in the mail If you have any lab test that is abnormal or we need to change your treatment, we will call you to review the results.  Testing/Procedures: None  Follow-Up: At [facility], you and your health needs are our priority.  As part of our continuing mission to provide you with exceptional heart care, our providers are all part of one team.  This team includes your primary Cardiologist (physician) and Advanced Practice Providers or APPs (Physician Assistants and Nurse Practitioners) who all work together to provide you with the care you need, when you need it.  Your next appointment:   4 week(s)  Provider:   Redell Leiter, MD    We recommend signing up for the patient portal called MyChart.  Sign up information is provided on this After Visit Summary.  MyChart is used to connect with patients for Virtual Visits (Telemedicine).  Patients are able to view lab/test results, encounter notes, upcoming appointments, etc.  Non-urgent messages can be sent to your provider as well.   To learn more about what you can do with MyChart, go to [email or web address].   Other Instructions Nurse visit on Thursday for EKG while Dr. Leiter is in the office.

## 2024-03-14 NOTE — Progress Notes (Addendum)
 She presents to [name] health 03/22/2024 for outpatient cardioversion.  Her EKG repeated shows sinus rhythm.  I met with her and her husband and we have a plan that she will purchase the mobile Kardia device check her heart rhythm at least once a day and bring this with her when she sees me in a month.  He previously had a my opinion very toxicity from amiodarone  if an additional antiarrhythmic drug would be needed it would be dofetilide or consider referral for atrial flutter ablation although she has an atypical atrial flutter.  In the interim she will continue Multaq  and her current anticoagulant.  Already has an appointment to be seen by me in 1 month.   EKG Interpretation Date/Time:  Monday March 14 2024 15:18:47 EDT Ventricular Rate:  120 PR Interval:    QRS Duration:  76 QT Interval:  332 QTC Calculation: 469 R Axis:   120  Text Interpretation: Atrial fibrillation with rapid ventricular response Left posterior fascicular block Nonspecific T wave abnormality When compared with ECG of 23-Oct-2023 14:48, Atrial fibrillation has replaced Sinus rhythm Vent. rate has increased BY  51 BPM Non-specific change in ST segment in Inferior leads Inverted T waves have replaced nonspecific T wave abnormality in Inferior leads Confirmed by Monetta Rogue ([id number]) on 03/14/2024 3:30:48 PM   Seen by me during her visit for an office EKG to confirm ongoing atrial fibrillation She wants to proceed with cardioversion options benefits risks detailed compliant with her anticoagulant Scheduled 03/22/2024 [facility] health    Cardiology Office Note:    Date:  03/14/2024   ID:  Karen Snyder, DOB [birth year], MRN [id number]  PCP:  Silver Lamar LABOR, MD  Cardiologist:  Rogue Monetta, MD    Referring MD: Silver Lamar LABOR, MD    ASSESSMENT:    1. Paroxysmal atrial fibrillation (HCC)   2. Atypical atrial flutter (HCC)   3. High risk medication use   4. Essential hypertension   5. Chronic anticoagulation     PLAN:    In order of problems listed above:  She is having recurrent atrial fibrillation looks like it is persistent at least till Saturday but we have no idea how long she has been in this rhythm.  Although asymptomatic rate is rapid for the time being continue her antiarrhythmic drug long-acting calcium channel blocker add on short acting metoprolol  for rate control. Continue her anticoagulant Reassess in the office Thursday morning regarding cardioversion Check labs today including a CBC BMP proBNP   Next appointment: 4 weeks   Medication Adjustments/Labs and Tests Ordered: Current medicines are reviewed at length with the patient today.  Concerns regarding medicines are outlined above.  Orders Placed This Encounter  Procedures   EKG 12-Lead   No orders of the defined types were placed in this encounter.    History of Present Illness:    Karen Snyder is a 83 y.o. female with a hx of atypical atrial flutter maintaining sinus rhythm Multaq  and currently anticoagulated hypertensive heart disease with heart failure obstructive sleep apnea on CPAP and recurrent hematuria last seen 05/11/2023. Compliance with diet, lifestyle and medications: Yes  Saturday evening after she missed a dose of Eliquis  in the morning she decided to check her heartbeat with a pulse meter and it was 140 bpm She really has no awareness she is in atrial fibrillation since then her pulse has been erratic and she continues and has documented atrial fibrillation today Previously we had set her up for cardioversion in the  past and when she presented to the hospital she was in sinus rhythm She is hesitant to be cardioverted so organ to do is add on a beta-blocker to control her rate continue for the time being her antiarrhythmic drug but if she remains in A-fib permanently will need to stop it along with her Cardizem  recheck Thursday morning and make a decision about cardioversion She has not missed her  anticoagulant she has had no bleeding no edema shortness of breath palpitation or syncope She does not feel ill Past Medical History:  Diagnosis Date   A-fib (HCC)    Asthma    Atrial fibrillation with RVR (HCC) 02/04/2021   Blepharitis of upper and lower eyelids of both eyes 07/19/2019   Bronchitis 06/20/2019   Capsulitis of metatarsophalangeal (MTP) joint of left foot 07/01/2016   Dependent edema 06/20/2019   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/19/2019   Environmental and seasonal allergies 04/15/2018   Essential hypertension 04/15/2018   Exertional dyspnea 06/20/2019   GERD (gastroesophageal reflux disease)    GERD without esophagitis 04/15/2018   High blood pressure    Lumbar back pain 04/04/2021   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   Mild persistent asthma without complication 04/15/2018   Peripheral drusen, bilateral 07/19/2019   Postmenopausal bleeding 08/23/2015   Rhinosinusitis 01/11/2020   Screening for colon cancer 08/15/2019   Tachycardia 01/24/2021   TMJ tenderness, right 08/15/2019    Current Medications: Current Meds  Medication Sig   albuterol  (PROVENTIL  HFA) 108 (90 Base) MCG/ACT inhaler Inhale two puffs every four to six hours as needed for cough or wheeze.   apixaban  (ELIQUIS ) 5 MG TABS tablet Take 1 tablet (5 mg total) by mouth 2 (two) times daily.   Azelastine HCl (ASTEPRO) 0.15 % SOLN Place 1 spray into both nostrils at bedtime.   BIOTIN FORTE PO Take 1 tablet by mouth daily.   calcium-vitamin D (OSCAL WITH D) 500-200 MG-UNIT tablet Take 1 tablet by mouth daily.   dexlansoprazole  (DEXILANT ) 60 MG capsule TAKE 1 CAPSULE(60 MG) BY MOUTH EVERY MORNING   diltiazem  (DILT-XR) 120 MG 24 hr capsule Take 1 capsule (120 mg total) by mouth daily.   dronedarone  (MULTAQ ) 400 MG tablet Take 1 tablet (400 mg total) by mouth 2 (two) times daily with a meal.   furosemide  (LASIX ) 20 MG tablet Take 1 tablet (20 mg total) by mouth daily.    Mometasone  Furoate (ASMANEX  HFA) 200 MCG/ACT AERO Inhale two puffs twice daily to prevent cough or wheeze. Rinse mouth after use.   Multiple Vitamin (MULTI-VITAMIN DAILY PO) Take 1 tablet by mouth daily.   potassium chloride  (MICRO-K ) 10 MEQ CR capsule Take 1 capsule (10 mEq total) by mouth daily.   telmisartan  (MICARDIS ) 40 MG tablet Take 1 tablet (40 mg total) by mouth daily.      EKGs/Labs/Other Studies Reviewed:    The following studies were reviewed today:  Cardiac Studies & Procedures   ______________________________________________________________________________________________     ECHOCARDIOGRAM  ECHOCARDIOGRAM COMPLETE 11/17/2023  Narrative ECHOCARDIOGRAM REPORT    Patient Name:   Karen Snyder Date of Exam: 11/17/2023 Medical Rec #:  [id number]  Height:       63.0 in Accession #:    [id number] Weight:       153.6 lb Date of Birth:  Aug 15, 1940 BSA:          1.728 m Patient Age:    82 years   BP:  122/58 mmHg Patient Gender: F          HR:           67 bpm. Exam Location:  [place]  Procedure: 2D Echo, Color Doppler, Cardiac Doppler and Strain Analysis (Both Spectral and Color Flow Doppler were utilized during procedure).  Indications:    High risk medication use [Z79.899 (ICD-10-CM)]; Paroxysmal atrial fibrillation (HCC) [I48.0 (ICD-10-CM)]; Essential hypertension [I10 (ICD-10-CM)]; Mild mitral regurgitation [I34.0 (ICD-10-CM)]  History:        Patient has prior history of Echocardiogram examinations, most recent 03/11/2021. Arrythmias:Atrial Fibrillation, Signs/Symptoms:Dyspnea; Risk Factors:Hypertension.  Sonographer:    Charlie Jointer RDCS Referring Phys: [id number] ALEAN SAUNDERS MADIREDDY  IMPRESSIONS   1. Left ventricular ejection fraction, by estimation, is 60 to 65%. The left ventricle has normal function. The left ventricle has no regional wall motion abnormalities. Left ventricular diastolic parameters are indeterminate. The average  left ventricular global longitudinal strain is -16.6 %. The global longitudinal strain is normal. 2. Right ventricular systolic function is normal. The right ventricular size is normal. There is mildly elevated pulmonary artery systolic pressure. 3. Left atrial size was mild to moderately dilated. 4. The mitral valve is normal in structure. Mild mitral valve regurgitation. No evidence of mitral stenosis. 5. The aortic valve is normal in structure. Aortic valve regurgitation is not visualized. No aortic stenosis is present. 6. The inferior vena cava is normal in size with greater than 50% respiratory variability, suggesting right atrial pressure of 3 mmHg.  FINDINGS Left Ventricle: Left ventricular ejection fraction, by estimation, is 60 to 65%. The left ventricle has normal function. The left ventricle has no regional wall motion abnormalities. The average left ventricular global longitudinal strain is -16.6 %. Strain was performed and the global longitudinal strain is normal. The left ventricular internal cavity size was normal in size. There is no left ventricular hypertrophy. Left ventricular diastolic parameters are indeterminate.  Right Ventricle: The right ventricular size is normal. No increase in right ventricular wall thickness. Right ventricular systolic function is normal. There is mildly elevated pulmonary artery systolic pressure. The tricuspid regurgitant velocity is 3.12 m/s, and with an assumed right atrial pressure of 3 mmHg, the estimated right ventricular systolic pressure is 41.9 mmHg.  Left Atrium: Left atrial size was mild to moderately dilated.  Right Atrium: Right atrial size was normal in size.  Pericardium: There is no evidence of pericardial effusion.  Mitral Valve: The mitral valve is normal in structure. Mild mitral valve regurgitation. No evidence of mitral valve stenosis.  Tricuspid Valve: The tricuspid valve is normal in structure. Tricuspid valve regurgitation  is trivial. No evidence of tricuspid stenosis.  Aortic Valve: The aortic valve is normal in structure. Aortic valve regurgitation is not visualized. No aortic stenosis is present.  Pulmonic Valve: The pulmonic valve was normal in structure. Pulmonic valve regurgitation is not visualized. No evidence of pulmonic stenosis.  Aorta: The aortic root is normal in size and structure.  Venous: The inferior vena cava is normal in size with greater than 50% respiratory variability, suggesting right atrial pressure of 3 mmHg.  IAS/Shunts: No atrial level shunt detected by color flow Doppler.   LEFT VENTRICLE PLAX 2D LVIDd:         4.20 cm   Diastology LVIDs:         2.40 cm   LV e' medial:    6.96 cm/s LV PW:         1.00 cm   LV E/e' medial:  20.7 LV IVS:        0.95 cm   LV e' lateral:   7.18 cm/s LVOT diam:     1.75 cm   LV E/e' lateral: 20.1 LV SV:         50 LV SV Index:   29        2D Longitudinal Strain LVOT Area:     2.41 cm  2D Strain GLS Avg:     -16.6 %   RIGHT VENTRICLE             IVC RV Basal diam:  3.00 cm     IVC diam: 2.00 cm RV Mid diam:    2.60 cm RV S prime:     16.85 cm/s TAPSE (M-mode): 2.5 cm  LEFT ATRIUM             Index        RIGHT ATRIUM           Index LA diam:        5.00 cm 2.89 cm/m   RA Area:     12.80 cm LA Vol (A2C):   53.7 ml 31.07 ml/m  RA Volume:   29.50 ml  17.07 ml/m LA Vol (A4C):   70.7 ml 40.90 ml/m LA Biplane Vol: 61.6 ml 35.64 ml/m AORTIC VALVE LVOT Vmax:   100.30 cm/s LVOT Vmean:  69.233 cm/s LVOT VTI:    0.209 m  AORTA Ao Root diam: 2.80 cm Ao Asc diam:  2.60 cm Ao Desc diam: 2.00 cm  MITRAL VALVE                  TRICUSPID VALVE MV Area (PHT): 3.21 cm       TR Peak grad:   38.9 mmHg MV Decel Time: 236 msec       TR Vmax:        312.00 cm/s MR Peak grad:    81.7 mmHg MR Vmax:         452.00 cm/s  SHUNTS MR PISA:         0.57 cm     Systemic VTI:  0.21 m MR PISA Eff ROA: 5 mm        Systemic Diam: 1.75 cm MR PISA Radius:   0.30 cm MV E velocity: 144.00 cm/s  Lamar Fitch MD Electronically signed by Lamar Fitch MD Signature Date/Time: 11/17/2023/3:14:10 PM    Final          ______________________________________________________________________________________________      EKG Interpretation Date/Time:  Monday March 14 2024 15:18:47 EDT Ventricular Rate:  120 PR Interval:    QRS Duration:  76 QT Interval:  332 QTC Calculation: 469 R Axis:   120  Text Interpretation: Atrial fibrillation with rapid ventricular response Left posterior fascicular block Nonspecific T wave abnormality When compared with ECG of 23-Oct-2023 14:48, Atrial fibrillation has replaced Sinus rhythm Vent. rate has increased BY  51 BPM Non-specific change in ST segment in Inferior leads Inverted T waves have replaced nonspecific T wave abnormality in Inferior leads Confirmed by Monetta Rogue ([id number]) on 03/14/2024 3:30:48 PM   Recent Labs: No results found for requested labs within last 365 days.  Recent Lipid Panel No results found for: CHOL, TRIG, HDL, CHOLHDL, VLDL, LDLCALC, LDLDIRECT  Physical Exam:    VS:  BP 132/70   Pulse (!) 130   Ht 5' 2 (1.575 m)   Wt 149 lb 3.2 oz (67.7 kg)   SpO2 99%  BMI 27.29 kg/m     Wt Readings from Last 3 Encounters:  03/14/24 149 lb 3.2 oz (67.7 kg)  10/23/23 153 lb 9.6 oz (69.7 kg)  05/11/23 153 lb (69.4 kg)     GEN:  Well nourished, well developed in no acute distress HEENT: Normal NECK: No JVD; No carotid bruits LYMPHATICS: No lymphadenopathy CARDIAC: Irregular rate and rhythm RRR, no murmurs, rubs, gallops RESPIRATORY:  Clear to auscultation without rales, wheezing or rhonchi  ABDOMEN: Soft, non-tender, non-distended MUSCULOSKELETAL:  No edema; No deformity  SKIN: Warm and dry NEUROLOGIC:  Alert and oriented x 3 PSYCHIATRIC:  Normal affect    Signed, Redell Leiter, MD  03/14/2024 3:45 PM    [facility]

## 2024-03-15 ENCOUNTER — Encounter: Payer: Self-pay | Admitting: Cardiology

## 2024-03-15 LAB — CBC
Hematocrit: 40 % (ref 34.0–46.6)
Hemoglobin: 13 g/dL (ref 11.1–15.9)
MCH: 30 pg (ref 26.6–33.0)
MCHC: 32.5 g/dL (ref 31.5–35.7)
MCV: 92 fL (ref 79–97)
Platelets: 249 x10E3/uL (ref 150–450)
RBC: 4.33 x10E6/uL (ref 3.77–5.28)
RDW: 12.7 % (ref 11.7–15.4)
WBC: 6.8 x10E3/uL (ref 3.4–10.8)

## 2024-03-15 LAB — BASIC METABOLIC PANEL WITH GFR
BUN/Creatinine Ratio: 15 (ref 12–28)
BUN: 18 mg/dL (ref 8–27)
CO2: 23 mmol/L (ref 20–29)
Calcium: 9.4 mg/dL (ref 8.7–10.3)
Chloride: 102 mmol/L (ref 96–106)
Creatinine, Ser: 1.21 mg/dL — ABNORMAL HIGH (ref 0.57–1.00)
Glucose: 94 mg/dL (ref 70–99)
Potassium: 4.6 mmol/L (ref 3.5–5.2)
Sodium: 140 mmol/L (ref 134–144)
eGFR: 44 mL/min/1.73 — ABNORMAL LOW (ref >59)

## 2024-03-15 LAB — PRO B NATRIURETIC PEPTIDE: NT-Pro BNP: 511 pg/mL (ref 0–738)

## 2024-03-16 ENCOUNTER — Encounter: Payer: Self-pay | Admitting: Allergy and Immunology

## 2024-03-16 ENCOUNTER — Ambulatory Visit: Payer: Medicare PPO | Admitting: Allergy and Immunology

## 2024-03-16 VITALS — BP 118/78 | HR 140 | Resp 20

## 2024-03-16 DIAGNOSIS — K219 Gastro-esophageal reflux disease without esophagitis: Secondary | ICD-10-CM | POA: Diagnosis not present

## 2024-03-16 DIAGNOSIS — J4530 Mild persistent asthma, uncomplicated: Secondary | ICD-10-CM | POA: Diagnosis not present

## 2024-03-16 DIAGNOSIS — J3089 Other allergic rhinitis: Secondary | ICD-10-CM | POA: Diagnosis not present

## 2024-03-16 MED ORDER — PANTOPRAZOLE SODIUM 40 MG PO TBEC: DELAYED_RELEASE_TABLET | ORAL | 5 refills | Status: AC

## 2024-03-16 NOTE — Progress Notes (Unsigned)
 [place] - [place] - [place] - [place] - [place]   Follow-up Note  Referring Provider: Silver Lamar LABOR, MD Primary Provider: Silver Lamar LABOR, MD Date of Office Visit: 03/16/2024  Subjective:   Karen Snyder (DOB: 11-19-1940) is a 83 y.o. female who [facility] in re-evaluation of the following:  HPI: Mayra returns to this clinic in evaluation of asthma, rhinitis, LPR, and a history of hypoxemic respiratory failure possibly secondary to amiodarone  use, possibly secondary to COVID-vaccine, and history of sleep apnea.  I last saw her in this clinic 16 September 2023.  She still has some nagging cough although it might be somewhat better since she started her Asmanex  especially since she increased her Asmanex  to twice a day.  She never uses a short acting bronchodilator.  She can exert herself without much problem.  She does not use any oxygen supplementation ever since she resolved her episode of respiratory tract failure a few years ago.  She has no problems with her nose and she rarely uses any nasal sprays.  She does not use any oral antihistamines because of her dry eye syndrome.  She continues to follow-up with Dr. Annella, pulmonology, concerning her sleep apnea.  Allergies as of 03/16/2024   No Known Allergies      Medication List    albuterol  108 (90 Base) MCG/ACT inhaler Commonly known as: Proventil  HFA Inhale two puffs every four to six hours as needed for cough or wheeze.   apixaban  5 MG Tabs tablet Commonly known as: Eliquis  Take 1 tablet (5 mg total) by mouth 2 (two) times daily.   Asmanex  HFA 200 MCG/ACT Aero Generic drug: Mometasone  Furoate Inhale two puffs twice daily to prevent cough or wheeze. Rinse mouth after use.   Astepro 0.15 % Soln Generic drug: Azelastine HCl Place 1 spray into both nostrils at bedtime.   BIOTIN FORTE PO Take 1 tablet by mouth daily.   calcium-vitamin D 500-200 MG-UNIT  tablet Commonly known as: OSCAL WITH D Take 1 tablet by mouth daily.   dexlansoprazole  60 MG capsule Commonly known as: DEXILANT  TAKE 1 CAPSULE(60 MG) BY MOUTH EVERY MORNING   diltiazem  120 MG 24 hr capsule Commonly known as: Dilt-XR Take 1 capsule (120 mg total) by mouth daily.   furosemide  20 MG tablet Commonly known as: LASIX  Take 1 tablet (20 mg total) by mouth daily.   metoprolol  tartrate 25 MG tablet Commonly known as: LOPRESSOR  Take 1 tablet (25 mg total) by mouth 2 (two) times daily. For heart rate greater than 110 bpm   Multaq  400 MG tablet Generic drug: dronedarone  Take 1 tablet (400 mg total) by mouth 2 (two) times daily with a meal.   MULTI-VITAMIN DAILY PO Take 1 tablet by mouth daily.   pantoprazole  40 MG tablet Commonly known as: Protonix  Take one tablet twice daily as directed Started by: Arnetha Silverthorne J Troyce Gieske   potassium chloride  10 MEQ CR capsule Commonly known as: MICRO-K  Take 1 capsule (10 mEq total) by mouth daily.   telmisartan  40 MG tablet Commonly known as: MICARDIS  Take 1 tablet (40 mg total) by mouth daily.    Past Medical History:  Diagnosis Date   A-fib [facility])    Asthma    Atrial fibrillation with RVR (HCC) 02/04/2021   Blepharitis of upper and lower eyelids of both eyes 07/19/2019   Bronchitis 06/20/2019   Capsulitis of metatarsophalangeal (MTP) joint of left foot 07/01/2016   Dependent edema 06/20/2019  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/19/2019   Environmental and seasonal allergies 04/15/2018   Essential hypertension 04/15/2018   Exertional dyspnea 06/20/2019   GERD (gastroesophageal reflux disease)    GERD without esophagitis 04/15/2018   High blood pressure    Lumbar back pain 04/04/2021   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   Mild persistent asthma without complication 04/15/2018   Peripheral drusen, bilateral 07/19/2019   Postmenopausal bleeding 08/23/2015   Rhinosinusitis 01/11/2020   Screening  for colon cancer 08/15/2019   Tachycardia 01/24/2021   TMJ tenderness, right 08/15/2019    Past Surgical History:  Procedure Laterality Date   ADENOIDECTOMY  1946   HERNIA REPAIR  2016   TONSILLECTOMY  1946    Review of systems negative except as noted in HPI / PMHx or noted below:  Review of Systems  Constitutional: Negative.   HENT: Negative.    Eyes: Negative.   Respiratory: Negative.    Cardiovascular: Negative.   Gastrointestinal: Negative.   Genitourinary: Negative.   Musculoskeletal: Negative.   Skin: Negative.   Neurological: Negative.   Endo/Heme/Allergies: Negative.   Psychiatric/Behavioral: Negative.       Objective:   Vitals:   03/16/24 1512  BP: 118/78  Pulse: (!) 140  Resp: 20  SpO2: 96%          Physical Exam Constitutional:      Appearance: She is not diaphoretic.  HENT:     Head: Normocephalic.     Right Ear: Ear canal and external ear normal.     Left Ear: Ear canal and external ear normal.     Ears:     Comments: Bilateral hearing aids    Nose: Nose normal. No mucosal edema or rhinorrhea.     Mouth/Throat:     Pharynx: Uvula midline. No oropharyngeal exudate.  Eyes:     Conjunctiva/sclera: Conjunctivae normal.  Neck:     Thyroid: No thyromegaly.     Trachea: Trachea normal. No tracheal tenderness or tracheal deviation.  Cardiovascular:     Rate and Rhythm: Normal rate and regular rhythm.     Heart sounds: Normal heart sounds, S1 normal and S2 normal. No murmur heard. Pulmonary:     Effort: No respiratory distress.     Breath sounds: Normal breath sounds. No stridor. No wheezing or rales.  Lymphadenopathy:     Head:     Right side of head: No tonsillar adenopathy.     Left side of head: No tonsillar adenopathy.     Cervical: No cervical adenopathy.  Skin:    Findings: No erythema or rash.     Nails: There is no clubbing.  Neurological:     Mental Status: She is alert.     Diagnostics: Spirometry was performed and  demonstrated an FEV1 of 1.29 at 76 % of predicted.   Assessment and Plan:   1. Asthma, well controlled, mild persistent   2. Other allergic rhinitis   3. LPRD (laryngopharyngeal reflux disease)     1. Continue to Treat reflux:   A.  Change Dexilant  to pantoprazole  40 mg - 2 times per day  B.  Does pantoprazole  2 times per day help cough?   2. Continue to Treat inflammation:   A.  Asmanex  200 HFA - 2 inhalations 1-2 time per day (empty lungs)  3. If needed:   A. Albuterol  HFA 2 puffs every 4-6 hours  B. Nasal saline  C. OTC Pataday-1 drop each eye 1  time per day  D. Astepro - 1-2 sprays each nostril 1-2 times per day  4.  Influenza = Tamiflu. Covid = molnupiravir  5.  Return to clinic in 6 months or earlier if problem  Will have Annika use the proton pump inhibitor twice a day to see if we can make some impact on her LPR with some continued cough.  It has been very difficult to treat her cough and her cough has been in existence for decades.  Not sure that inhaled steroids helped very much but she thinks that they do help her somewhat and she can continue to use these agents.  If she does well we will see her back in this clinic in 6 months or earlier if there is a problem.   Camellia Denis, MD Allergy / Immunology [facility]

## 2024-03-16 NOTE — Patient Instructions (Addendum)
  1. Continue to Treat reflux:   A.  Change Dexilant  to pantoprazole  40 mg - 2 times per day  B.  Does pantoprazole  2 times per day help cough?   2. Continue to Treat inflammation:   A.  Asmanex  200 HFA - 2 inhalations 1-2 time per day (empty lungs)  3. If needed:   A. Albuterol  HFA 2 puffs every 4-6 hours  B. Nasal saline  C. OTC Pataday-1 drop each eye 1 time per day  D. Astepro - 1-2 sprays each nostril 1-2 times per day  4.  Influenza = Tamiflu. Covid = molnupiravir  5.  Return to clinic in 6 months or earlier if problem

## 2024-03-17 ENCOUNTER — Ambulatory Visit: Attending: Cardiology

## 2024-03-17 ENCOUNTER — Encounter: Payer: Self-pay | Admitting: Allergy and Immunology

## 2024-03-17 VITALS — BP 90/50 | HR 113 | Resp 20 | Wt 146.8 lb

## 2024-03-17 DIAGNOSIS — I480 Paroxysmal atrial fibrillation: Secondary | ICD-10-CM

## 2024-03-17 NOTE — Progress Notes (Signed)
   Nurse Visit   Date of Encounter: 03/17/2024 ID: Karen Snyder, DOB 12-05-1940, MRN [id number]  PCP:  Silver Lamar LABOR, MD   [facility] Providers Cardiologist:  None      Visit Details   VS:  BP (!) 90/50 (BP Location: Left Arm, Patient Position: Sitting, Cuff Size: Normal)   Pulse (!) 113   Resp 20   Wt 146 lb 12.8 oz (66.6 kg)   SpO2 99%   BMI 26.85 kg/m  , BMI Body mass index is 26.85 kg/m.  Wt Readings from Last 3 Encounters:  03/17/24 146 lb 12.8 oz (66.6 kg)  03/14/24 149 lb 3.2 oz (67.7 kg)  10/23/23 153 lb 9.6 oz (69.7 kg)     Reason for visit: Perform EKG to determine if patient needs a cardioversion Performed today: EKG, Vitals, Provider consulted and Education Changes (medications, testing, etc.) : Dr. Monetta recommended a a cardioversion for the patient Length of Visit: 30 minutes    Medications Adjustments/Labs and Tests Ordered: Orders Placed This Encounter  Procedures   EKG 12-Lead   No orders of the defined types were placed in this encounter.    Signed, Charlie LILLETTE Free, RN  03/17/2024 11:01 AM

## 2024-03-17 NOTE — Patient Instructions (Addendum)
 Reviewed the following instructions with the patient. Patient verbalized understanding and had no further questions at this time:   You will be scheduled for a cardioversion at [facility] [address] [place],  [place], [place] [address].  DIET:  Nothing to eat or drink after midnight except a sip of water with medications (see medication instructions below)  MEDICATION INSTRUCTIONS: !!IF ANY NEW MEDICATIONS ARE STARTED AFTER TODAY, PLEASE NOTIFY YOUR PROVIDER AS SOON AS POSSIBLE!!  FYI: Medications such as Semaglutide (Ozempic, [place]), Tirzepatide (Mounjaro, Zepbound), Dulaglutide (Trulicity), etc (GLP1 agonists) AND Canagliflozin (Invokana), Dapagliflozin (Farxiga), Empagliflozin (Jardiance), Ertugliflozin (Steglatro), Bexagliflozin [employer]) or any combination with one of these drugs such as Invokamet (Canagliflozin/Metformin), Synjardy (Empagliflozin/Metformin), etc (SGLT2 inhibitors) must be held around the time of a procedure. This is not a comprehensive list of all of these drugs. Please review all of your medications and talk to your provider if you take any one of these. If you are not sure, ask your provider.  Hold Lasix  the morning of your procedure. Continue taking your anticoagulant (blood thinner): Apixaban  (Eliquis ).  You will need to continue this after your procedure until you are told by your provider that it is safe to stop.    LABS:  CBC and BMP completed on 03/14/24   FYI:  For your safety, and to allow us  to monitor your vital signs accurately during the surgery/procedure we request: If you have artificial nails, gel coating, SNS etc, please have those removed prior to your surgery/procedure. Not having the nail coverings /polish removed may result in cancellation or delay of your surgery/procedure.  Your support person will be asked to wait in the waiting room during your procedure.  It is OK to have someone drop you off and come back when you are ready to be  discharged.  You cannot drive after the procedure and will need someone to drive you home.  Bring your insurance cards.  *Special Note: Every effort is made to have your procedure done on time. Occasionally there are emergencies that occur at the hospital that may cause delays. Please be patient if a delay does occur.

## 2024-03-18 ENCOUNTER — Other Ambulatory Visit: Payer: Self-pay

## 2024-03-18 ENCOUNTER — Telehealth: Payer: Self-pay | Admitting: Cardiology

## 2024-03-18 NOTE — Telephone Encounter (Signed)
 Pt c/o medication issue:  1. Name of Medication:   diltiazem  (DILT-XR) 120 MG 24 hr capsule  dronedarone  (MULTAQ ) 400 MG tablet  telmisartan  (MICARDIS ) 40 MG tablet   2. How are you currently taking this medication (dosage and times per day)?   3. Are you having a reaction (difficulty breathing--STAT)?   4. What is your medication issue?   Caller Karle) wanted a call back directly to patient at [phone] to confirm she should still take the above medications prior to her cardioversion on 9/2.

## 2024-03-22 DIAGNOSIS — I480 Paroxysmal atrial fibrillation: Secondary | ICD-10-CM | POA: Diagnosis not present

## 2024-03-22 NOTE — Telephone Encounter (Signed)
 Called the patient and she stated that her cardioversion was canceled because when they did an EKG before her cardioversion she was found to be back in sinus rhythm. Dr. Monetta came in and spoke to her answered all of her questions. Patient had no further questions at this time.

## 2024-03-24 ENCOUNTER — Ambulatory Visit: Admitting: Cardiology

## 2024-03-24 ENCOUNTER — Ambulatory Visit

## 2024-04-07 ENCOUNTER — Encounter: Payer: Self-pay | Admitting: Cardiology

## 2024-04-11 NOTE — Progress Notes (Unsigned)
 Cardiology Office Note:    Date:  04/14/2024   ID:  Karen Snyder, DOB [birth year], MRN [id number]  PCP:  Silver Lamar LABOR, MD  Cardiologist:  Redell Leiter, MD    Referring MD: Silver Lamar LABOR, MD    ASSESSMENT:    1. Atypical atrial flutter (HCC)   2. Paroxysmal atrial fibrillation (HCC)   3. Chronic anticoagulation   4. Hypertensive heart disease with heart failure (HCC)    PLAN:    In order of problems listed above:  She is having atrial fibrillation but 1 strip also look like atypical atrial flutter much more frequent greater burden than we have both thought and started antiarrhythmic drug with flecainide  Office EKG Tuesday reassess her QRS duration rhythm Continue a minimum dose of long-acting beta-blocker along with her antiarrhythmic drug Continue her current anticoagulant Stable she will continue her low-dose diuretic ARB   Next appointment: 6 weeks   Medication Adjustments/Labs and Tests Ordered: Current medicines are reviewed at length with the patient today.  Concerns regarding medicines are outlined above.  No orders of the defined types were placed in this encounter.  No orders of the defined types were placed in this encounter.    History of Present Illness:    Karen Snyder is a 83 y.o. female with a hx of atypical atrial flutter maintaining sinus rhythm on Multaq  currently anticoagulated hypertensive heart disease with heart failure obstructive sleep apnea on CPAP and recurrent hematuria last seen 03/14/2024.  When seen in the office she was having atrial fibrillation which appeared to be persistent and we plan for cardioversion.  The day of cardioversion she was back in sinus rhythm.  Compliance with diet, lifestyle and medications: Yes  As we discussed she went ahead and purchased the mobile Kardia device and she is having frequent episodes of asymptomatic atrial fibrillation rates above 100 bpm She used the device and she captured multiple episodes of  atrial fibrillation I reviewed the strips personally She is asymptomatic but I think she would benefit from maintaining sinus rhythm along with her anticoagulation We reviewed the options of pulmonary vein isolation EP ablation versus antiarrhythmic drug and I agree with her we will start with flecainide  low-dose 50 mg twice daily continue a minimum dose of the beta-blocker along with that unless she is having persistent bradycardia her current anticoagulant she will self monitor daily with her device and come back to her office Tuesday for an office EKG. Past Medical History:  Diagnosis Date   A-fib [facility])    Asthma    Atrial fibrillation with RVR (HCC) 02/04/2021   Blepharitis of upper and lower eyelids of both eyes 07/19/2019   Bronchitis 06/20/2019   Capsulitis of metatarsophalangeal (MTP) joint of left foot 07/01/2016   Dependent edema 06/20/2019   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/19/2019   Environmental and seasonal allergies 04/15/2018   Essential hypertension 04/15/2018   Exertional dyspnea 06/20/2019   GERD (gastroesophageal reflux disease)    GERD without esophagitis 04/15/2018   High blood pressure    High risk medication use 10/23/2023   Lumbar back pain 04/04/2021   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   Mild mitral regurgitation 10/23/2023   Mild persistent asthma without complication 04/15/2018   Peripheral drusen, bilateral 07/19/2019   Postmenopausal bleeding 08/23/2015   Rhinosinusitis 01/11/2020   Screening for colon cancer 08/15/2019   Tachycardia 01/24/2021   TMJ tenderness, right 08/15/2019    Current Medications: Current Meds  Medication  Sig   albuterol  (PROVENTIL  HFA) 108 (90 Base) MCG/ACT inhaler Inhale two puffs every four to six hours as needed for cough or wheeze.   apixaban  (ELIQUIS ) 5 MG TABS tablet Take 1 tablet (5 mg total) by mouth 2 (two) times daily.   Azelastine HCl (ASTEPRO) 0.15 % SOLN Place 1 spray into both  nostrils at bedtime.   BIOTIN FORTE PO Take 1 tablet by mouth daily.   calcium-vitamin D (OSCAL WITH D) 500-200 MG-UNIT tablet Take 1 tablet by mouth daily.   dexlansoprazole  (DEXILANT ) 60 MG capsule TAKE 1 CAPSULE(60 MG) BY MOUTH EVERY MORNING   diltiazem  (DILT-XR) 120 MG 24 hr capsule Take 1 capsule (120 mg total) by mouth daily.   doxylamine, Sleep, (UNISOM) 25 MG tablet Take 25 mg by mouth at bedtime as needed.   ferrous sulfate 325 (65 FE) MG EC tablet Take 325 mg by mouth daily with breakfast.   furosemide  (LASIX ) 20 MG tablet Take 1 tablet (20 mg total) by mouth daily.   metoprolol  tartrate (LOPRESSOR ) 25 MG tablet Take 1 tablet (25 mg total) by mouth 2 (two) times daily. For heart rate greater than 110 bpm   Mometasone  Furoate (ASMANEX  HFA) 200 MCG/ACT AERO Inhale two puffs twice daily to prevent cough or wheeze. Rinse mouth after use.   Multiple Vitamin (MULTI-VITAMIN DAILY PO) Take 1 tablet by mouth daily.   pantoprazole  (PROTONIX ) 40 MG tablet Take one tablet twice daily as directed   potassium chloride  (MICRO-K ) 10 MEQ CR capsule Take 1 capsule (10 mEq total) by mouth daily.   telmisartan  (MICARDIS ) 40 MG tablet Take 1 tablet (40 mg total) by mouth daily.      EKGs/Labs/Other Studies Reviewed:    The following studies were reviewed today:  Cardiac Studies & Procedures   ______________________________________________________________________________________________     ECHOCARDIOGRAM  ECHOCARDIOGRAM COMPLETE 11/17/2023  Narrative ECHOCARDIOGRAM REPORT    Patient Name:   Karen Snyder Date of Exam: 11/17/2023 Medical Rec #:  [id number]  Height:       63.0 in Accession #:    [id number] Weight:       153.6 lb Date of Birth:  09-Jul-1941 BSA:          1.728 m Patient Age:    82 years   BP:           122/58 mmHg Patient Gender: F          HR:           67 bpm. Exam Location:  [facility]  Procedure: 2D Echo, Color Doppler, Cardiac Doppler and Strain Analysis (Both Spectral and  Color Flow Doppler were utilized during procedure).  Indications:    High risk medication use [Z79.899 (ICD-10-CM)]; Paroxysmal atrial fibrillation (HCC) [I48.0 (ICD-10-CM)]; Essential hypertension [I10 (ICD-10-CM)]; Mild mitral regurgitation [I34.0 (ICD-10-CM)]  History:        Patient has prior history of Echocardiogram examinations, most recent 03/11/2021. Arrythmias:Atrial Fibrillation, Signs/Symptoms:Dyspnea; Risk Factors:Hypertension.  Sonographer:    Charlie Jointer RDCS Referring Phys: [id number] ALEAN SAUNDERS MADIREDDY  IMPRESSIONS   1. Left ventricular ejection fraction, by estimation, is 60 to 65%. The left ventricle has normal function. The left ventricle has no regional wall motion abnormalities. Left ventricular diastolic parameters are indeterminate. The average left ventricular global longitudinal strain is -16.6 %. The global longitudinal strain is normal. 2. Right ventricular systolic function is normal. The right ventricular size is normal. There is mildly elevated pulmonary artery systolic pressure. 3. Left atrial size was mild to moderately  dilated. 4. The mitral valve is normal in structure. Mild mitral valve regurgitation. No evidence of mitral stenosis. 5. The aortic valve is normal in structure. Aortic valve regurgitation is not visualized. No aortic stenosis is present. 6. The inferior vena cava is normal in size with greater than 50% respiratory variability, suggesting right atrial pressure of 3 mmHg.  FINDINGS Left Ventricle: Left ventricular ejection fraction, by estimation, is 60 to 65%. The left ventricle has normal function. The left ventricle has no regional wall motion abnormalities. The average left ventricular global longitudinal strain is -16.6 %. Strain was performed and the global longitudinal strain is normal. The left ventricular internal cavity size was normal in size. There is no left ventricular hypertrophy. Left ventricular diastolic parameters are  indeterminate.  Right Ventricle: The right ventricular size is normal. No increase in right ventricular wall thickness. Right ventricular systolic function is normal. There is mildly elevated pulmonary artery systolic pressure. The tricuspid regurgitant velocity is 3.12 m/s, and with an assumed right atrial pressure of 3 mmHg, the estimated right ventricular systolic pressure is 41.9 mmHg.  Left Atrium: Left atrial size was mild to moderately dilated.  Right Atrium: Right atrial size was normal in size.  Pericardium: There is no evidence of pericardial effusion.  Mitral Valve: The mitral valve is normal in structure. Mild mitral valve regurgitation. No evidence of mitral valve stenosis.  Tricuspid Valve: The tricuspid valve is normal in structure. Tricuspid valve regurgitation is trivial. No evidence of tricuspid stenosis.  Aortic Valve: The aortic valve is normal in structure. Aortic valve regurgitation is not visualized. No aortic stenosis is present.  Pulmonic Valve: The pulmonic valve was normal in structure. Pulmonic valve regurgitation is not visualized. No evidence of pulmonic stenosis.  Aorta: The aortic root is normal in size and structure.  Venous: The inferior vena cava is normal in size with greater than 50% respiratory variability, suggesting right atrial pressure of 3 mmHg.  IAS/Shunts: No atrial level shunt detected by color flow Doppler.   LEFT VENTRICLE PLAX 2D LVIDd:         4.20 cm   Diastology LVIDs:         2.40 cm   LV e' medial:    6.96 cm/s LV PW:         1.00 cm   LV E/e' medial:  20.7 LV IVS:        0.95 cm   LV e' lateral:   7.18 cm/s LVOT diam:     1.75 cm   LV E/e' lateral: 20.1 LV SV:         50 LV SV Index:   29        2D Longitudinal Strain LVOT Area:     2.41 cm  2D Strain GLS Avg:     -16.6 %   RIGHT VENTRICLE             IVC RV Basal diam:  3.00 cm     IVC diam: 2.00 cm RV Mid diam:    2.60 cm RV S prime:     16.85 cm/s TAPSE (M-mode): 2.5  cm  LEFT ATRIUM             Index        RIGHT ATRIUM           Index LA diam:        5.00 cm 2.89 cm/m   RA Area:     12.80 cm LA Vol (A2C):  53.7 ml 31.07 ml/m  RA Volume:   29.50 ml  17.07 ml/m LA Vol (A4C):   70.7 ml 40.90 ml/m LA Biplane Vol: 61.6 ml 35.64 ml/m AORTIC VALVE LVOT Vmax:   100.30 cm/s LVOT Vmean:  69.233 cm/s LVOT VTI:    0.209 m  AORTA Ao Root diam: 2.80 cm Ao Asc diam:  2.60 cm Ao Desc diam: 2.00 cm  MITRAL VALVE                  TRICUSPID VALVE MV Area (PHT): 3.21 cm       TR Peak grad:   38.9 mmHg MV Decel Time: 236 msec       TR Vmax:        312.00 cm/s MR Peak grad:    81.7 mmHg MR Vmax:         452.00 cm/s  SHUNTS MR PISA:         0.57 cm     Systemic VTI:  0.21 m MR PISA Eff ROA: 5 mm        Systemic Diam: 1.75 cm MR PISA Radius:  0.30 cm MV E velocity: 144.00 cm/s  Lamar Fitch MD Electronically signed by Lamar Fitch MD Signature Date/Time: 11/17/2023/3:14:10 PM    Final          ______________________________________________________________________________________________          Recent Labs: 03/14/2024: BUN 18; Creatinine, Ser 1.21; Hemoglobin 13.0; NT-Pro BNP 511; Platelets 249; Potassium 4.6; Sodium 140  Recent Lipid Panel No results found for: CHOL, TRIG, HDL, CHOLHDL, VLDL, LDLCALC, LDLDIRECT  Physical Exam:    VS:  BP (!) 118/52   Pulse 64   Ht 5' 2 (1.575 m)   Wt 146 lb 3.2 oz (66.3 kg)   SpO2 98%   BMI 26.74 kg/m     Wt Readings from Last 3 Encounters:  04/14/24 146 lb 3.2 oz (66.3 kg)  03/17/24 146 lb 12.8 oz (66.6 kg)  03/14/24 149 lb 3.2 oz (67.7 kg)     GEN:  Well nourished, well developed in no acute distress HEENT: Normal NECK: No JVD; No carotid bruits LYMPHATICS: No lymphadenopathy CARDIAC: RRR, no murmurs, rubs, gallops RESPIRATORY:  Clear to auscultation without rales, wheezing or rhonchi  ABDOMEN: Soft, non-tender, non-distended MUSCULOSKELETAL:  No edema; No  deformity  SKIN: Warm and dry NEUROLOGIC:  Alert and oriented x 3 PSYCHIATRIC:  Normal affect    Signed, Redell Leiter, MD  04/14/2024 11:03 AM    [facility]

## 2024-04-14 ENCOUNTER — Ambulatory Visit: Attending: Cardiology | Admitting: Cardiology

## 2024-04-14 ENCOUNTER — Encounter: Payer: Self-pay | Admitting: Cardiology

## 2024-04-14 VITALS — BP 118/52 | HR 64 | Ht 62.0 in | Wt 146.2 lb

## 2024-04-14 DIAGNOSIS — I484 Atypical atrial flutter: Secondary | ICD-10-CM | POA: Diagnosis not present

## 2024-04-14 DIAGNOSIS — Z7901 Long term (current) use of anticoagulants: Secondary | ICD-10-CM

## 2024-04-14 DIAGNOSIS — I110 Hypertensive heart disease with heart failure: Secondary | ICD-10-CM | POA: Diagnosis not present

## 2024-04-14 DIAGNOSIS — I480 Paroxysmal atrial fibrillation: Secondary | ICD-10-CM | POA: Diagnosis not present

## 2024-04-14 MED ORDER — FLECAINIDE ACETATE 50 MG PO TABS: 50.0000 mg | ORAL_TABLET | Freq: Two times a day (BID) | ORAL | 3 refills | Status: DC

## 2024-04-14 MED ORDER — METOPROLOL SUCCINATE ER 25 MG PO TB24: 12.5000 mg | ORAL_TABLET | Freq: Every day | ORAL | 3 refills | Status: DC

## 2024-04-14 NOTE — Patient Instructions (Signed)
 Medication Instructions:  Your physician has recommended you make the following change in your medication:   START: Flecainide  50 mg two times daily START: Metoprolol  succinate 12.5 mg daily STOP: Metoprolol  tartrate   *If you need a refill on your cardiac medications before your next appointment, please call your pharmacy*  Lab Work: None If you have labs (blood work) drawn today and your tests are completely normal, you will receive your results only by: MyChart Message (if you have MyChart) OR A paper copy in the mail If you have any lab test that is abnormal or we need to change your treatment, we will call you to review the results.  Testing/Procedures: None  Follow-Up: At [facility], you and your health needs are our priority.  As part of our continuing mission to provide you with exceptional heart care, our providers are all part of one team.  This team includes your primary Cardiologist (physician) and Advanced Practice Providers or APPs (Physician Assistants and Nurse Practitioners) who all work together to provide you with the care you need, when you need it.  Your next appointment:   6 week(s)  Provider:   Redell Leiter, MD    We recommend signing up for the patient portal called MyChart.  Sign up information is provided on this After Visit Summary.  MyChart is used to connect with patients for Virtual Visits (Telemedicine).  Patients are able to view lab/test results, encounter notes, upcoming appointments, etc.  Non-urgent messages can be sent to your provider as well.   To learn more about what you can do with MyChart, go to [email or web address].   Other Instructions Nurse visit on Tuesday for EKG

## 2024-04-20 ENCOUNTER — Ambulatory Visit: Attending: Cardiology

## 2024-04-20 VITALS — BP 130/62 | HR 56 | Resp 20 | Wt 147.4 lb

## 2024-04-20 DIAGNOSIS — I480 Paroxysmal atrial fibrillation: Secondary | ICD-10-CM

## 2024-04-20 DIAGNOSIS — Z79899 Other long term (current) drug therapy: Secondary | ICD-10-CM

## 2024-04-20 NOTE — Progress Notes (Signed)
   Nurse Visit   Date of Encounter: 04/20/2024 ID: Inocente Masters, DOB 02/05/1941, MRN [id number]  PCP:  Silver Lamar LABOR, MD   [facility] Providers Cardiologist:  Dr. Monetta Finn to update primary MD,subspecialty MD or APP then REFRESH:1}     Visit Details   VS:  BP 130/62 (BP Location: Left Arm, Patient Position: Sitting, Cuff Size: Small)   Pulse (!) 56   Resp 20   Wt 147 lb 6.4 oz (66.9 kg)   SpO2 97%   BMI 26.96 kg/m  , BMI Body mass index is 26.96 kg/m.  Wt Readings from Last 3 Encounters:  04/20/24 147 lb 6.4 oz (66.9 kg)  04/14/24 146 lb 3.2 oz (66.3 kg)  03/17/24 146 lb 12.8 oz (66.6 kg)     Reason for visit: Perform EKG after starting Flecainide  Performed today: Vitals, EKG, Provider consulted and Education Changes (medications, testing, etc.) : No new orders at this time Length of Visit: 25 minutes    Medications Adjustments/Labs and Tests Ordered: Orders Placed This Encounter  Procedures   EKG 12-Lead   No orders of the defined types were placed in this encounter.    Signed, Charlie LILLETTE Free, RN  04/20/2024 2:22 PM

## 2024-04-21 NOTE — Progress Notes (Signed)
 Karen Snyder MRN: [id number] DOB: 1940-07-30 (age: 83 y.o.)  Is in clinic today for follow up.  Follow-up on her left elbow olecranon bursitis.  I drained this 2 weeks ago.  She states she is doing very well elbow has been feeling good and has no signs of recurrence.  On exam this is a well-nourished well-developed individual in no acute distress.  They are alert and oriented x3.  Mood and affect are age-appropriate.  They are cooperative with the exam.  They ambulate without difficulty or assistive device.  Head is normocephalic atraumatic.  Neck is supple without meningismus.  Cranial nerves II through XII are grossly intact without deficit.  Skin is warm dry and intact without any signs of infection.  Musculoskeletal examination of the left elbow with no residual swelling and no erythema.  She has full motion without pain.  Impression/plan  1. Olecranon bursitis of left elbow        Resolve olecranon bursitis.  We discussed the use of a Heelbo brace with any type of repetitive microinjury such as where she is resting her elbow on something for prolonged period of time.  She will follow-up with me on a as needed basis.

## 2024-04-25 ENCOUNTER — Encounter: Payer: Self-pay | Admitting: Cardiology

## 2024-04-25 ENCOUNTER — Encounter: Payer: Self-pay | Admitting: Pulmonary Disease

## 2024-04-25 ENCOUNTER — Ambulatory Visit: Admitting: Pulmonary Disease

## 2024-04-26 ENCOUNTER — Ambulatory Visit: Admitting: Pulmonary Disease

## 2024-04-26 ENCOUNTER — Encounter: Payer: Self-pay | Admitting: Pulmonary Disease

## 2024-04-26 VITALS — BP 137/78 | HR 62 | Temp 97.6°F | Ht 62.0 in | Wt 147.6 lb

## 2024-04-26 DIAGNOSIS — G4733 Obstructive sleep apnea (adult) (pediatric): Secondary | ICD-10-CM

## 2024-04-26 DIAGNOSIS — R053 Chronic cough: Secondary | ICD-10-CM | POA: Diagnosis not present

## 2024-04-26 NOTE — Progress Notes (Signed)
 @Patient  ID: Karen Snyder, female    DOB: 02-25-1941, 83 y.o.   MRN: [id number]  No chief complaint on file.   Referring provider: Silver Lamar LABOR, MD  HPI:   83 y.o. woman whom we are seeing in follow up  for OSA on CPAP  Overall doing okay.  Cough unchanged.  Per most recent A&I note cough ok on asmanex .  Reports good adherence to CPAP therapy.  Wearing nightly. OSA compliance report reviewed today.    HPI at initial visit: Patient with few months of gradual dyspnea.  Issue with chronic cough for decades.  Was seen in her allergist and noted to be incidentally hypoxemic.  Oxygen was ordered for her.  Work-up included CT high-res on my review interpretation shows bilateral pleural effusions with streaky opacity in the bases favored to represent atelectasis.  Read concern for possible ILD.  Her diuretic was increased from Lasix  20 mg every Monday Wednesday Friday to 20 mg daily.  She reports 8 pound weight loss in that week.  Her dyspnea has improved.  She walked in the clinic today and oxygen saturation 96% on room air.  She does at home without oxygen over the last few days her oxygen saturation has not dropped below 88%.  We discussed at length her imaging findings which to me is most consistent with volume overload.  Improvement in symptoms and hypoxemia with diuresis fits with this.  Fibrosis from any kind will not improve with diuresis alone.  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 , seasonal allergies, asthma Surgical history: Adenoidectomy/tonsillectomy, hernia repair Family history: Mother with hypertension, atrial fibrillation, father with hypertension, CAD Social history: Never smoker, [facility] in [facility] / Pulmonary Flowsheets:   ACT:  Asthma Control Test ACT Total Score  07/15/2017  3:00 PM 23  02/18/2017  3:00 PM 24    MMRC:     No data to display          Epworth:      No data to display          Tests:   FENO:   No results found for: NITRICOXIDE  PFT:    Latest Ref Rng & Units 01/02/2022    9:45 AM  PFT Results  FVC-Pre L 1.99   FVC-Predicted Pre % 85   FVC-Post L 2.06   FVC-Predicted Post % 88   Pre FEV1/FVC % % 80   Post FEV1/FCV % % 82   FEV1-Pre L 1.58   FEV1-Predicted Pre % 92   FEV1-Post L 1.69   DLCO uncorrected ml/min/mmHg 10.84   DLCO UNC% % 61   DLCO corrected ml/min/mmHg 10.84   DLCO COR %Predicted % 61   DLVA Predicted % 79   TLC L 4.48   TLC % Predicted % 94   RV % Predicted % 72   Spirometry reviewed interpreted as normal, no bronchodilator response, lung volumes within normal limits, DLCO cannot be interpreted given lack of repeatability, lack of quality per ATS standards  WALK:      No data to display          Imaging: Personally reviewed and as per EMR discussion this note No results found.  Lab Results: Personally reviewed CBC    Component Value Date/Time   WBC 6.8 03/14/2024 1604   WBC 9.0 07/19/2021 1115   RBC 4.33 03/14/2024 1604   RBC 4.02 07/19/2021 1115   HGB 13.0 03/14/2024 1604   HCT 40.0 03/14/2024 1604  PLT 249 03/14/2024 1604   MCV 92 03/14/2024 1604   MCH 30.0 03/14/2024 1604   MCH 27.6 07/19/2021 1115   MCHC 32.5 03/14/2024 1604   MCHC 32.4 07/19/2021 1115   RDW 12.7 03/14/2024 1604   LYMPHSABS 0.7 07/19/2021 1115   MONOABS 0.7 07/19/2021 1115   EOSABS 0.1 07/19/2021 1115   BASOSABS 0.1 07/19/2021 1115    BMET    Component Value Date/Time   NA 140 03/14/2024 1604   K 4.6 03/14/2024 1604   CL 102 03/14/2024 1604   CO2 23 03/14/2024 1604   GLUCOSE 94 03/14/2024 1604   GLUCOSE 98 07/30/2021 1011   BUN 18 03/14/2024 1604   CREATININE 1.21 (H) 03/14/2024 1604   CALCIUM 9.4 03/14/2024 1604   GFRNONAA >60 07/19/2021 1115    BNP    Component Value Date/Time   BNP 187.4 (H) 07/19/2021 1115    ProBNP    Component Value Date/Time   PROBNP 511 03/14/2024 1604    Specialty Problems       Pulmonary Problems    Mild persistent asthma without complication   Bronchitis   Exertional dyspnea   Rhinosinusitis   Asthma    No Known Allergies  Immunization History  Administered Date(s) Administered   Fluad Quad(high Dose 65+) 05/09/2021   INFLUENZA, HIGH DOSE SEASONAL PF 04/29/2017, 04/15/2018, 04/25/2019, 05/11/2020   Influenza-Unspecified 05/22/2006, 06/09/2007, 04/15/2008   Moderna Covid-19 Fall Seasonal Vaccine 64yrs & older 05/05/2022   Moderna Sars-Covid-2 Vaccination 01/04/2021   PFIZER(Purple Top)SARS-COV-2 Vaccination 07/27/2019, 08/17/2019, 04/26/2020   Pfizer Covid-19 Vaccine Bivalent Booster 28yrs & up 05/16/2021   Pneumococcal Conjugate-13 01/16/2018   Pneumococcal Polysaccharide-23 04/25/2019   Tdap 02/16/2007    Past Medical History:  Diagnosis Date   A-fib (HCC)    Asthma    Atrial fibrillation with RVR (HCC) 02/04/2021   Blepharitis of upper and lower eyelids of both eyes 07/19/2019   Bronchitis 06/20/2019   Capsulitis of metatarsophalangeal (MTP) joint of left foot 07/01/2016   Dependent edema 06/20/2019   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/19/2019   Environmental and seasonal allergies 04/15/2018   Essential hypertension 04/15/2018   Exertional dyspnea 06/20/2019   GERD (gastroesophageal reflux disease)    GERD without esophagitis 04/15/2018   High blood pressure    High risk medication use 10/23/2023   Lumbar back pain 04/04/2021   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   Mild mitral regurgitation 10/23/2023   Mild persistent asthma without complication 04/15/2018   Peripheral drusen, bilateral 07/19/2019   Postmenopausal bleeding 08/23/2015   Rhinosinusitis 01/11/2020   Screening for colon cancer 08/15/2019   Tachycardia 01/24/2021   TMJ tenderness, right 08/15/2019    Tobacco History: Social History   Tobacco Use  Smoking Status Never   Passive exposure: Never  Smokeless Tobacco Never   Counseling given: Not  Answered   Continue to not smoke  Outpatient Encounter Medications as of 04/26/2024  Medication Sig   albuterol  (PROVENTIL  HFA) 108 (90 Base) MCG/ACT inhaler Inhale two puffs every four to six hours as needed for cough or wheeze.   apixaban  (ELIQUIS ) 5 MG TABS tablet Take 1 tablet (5 mg total) by mouth 2 (two) times daily.   Azelastine HCl (ASTEPRO) 0.15 % SOLN Place 1 spray into both nostrils at bedtime.   BIOTIN FORTE PO Take 1 tablet by mouth daily.   calcium-vitamin D (OSCAL WITH D) 500-200 MG-UNIT tablet Take 1 tablet by mouth daily.   dexlansoprazole  (DEXILANT )  60 MG capsule TAKE 1 CAPSULE(60 MG) BY MOUTH EVERY MORNING   diltiazem  (DILT-XR) 120 MG 24 hr capsule Take 1 capsule (120 mg total) by mouth daily.   doxylamine, Sleep, (UNISOM) 25 MG tablet Take 25 mg by mouth at bedtime as needed.   ferrous sulfate 325 (65 FE) MG EC tablet Take 325 mg by mouth daily with breakfast.   flecainide  (TAMBOCOR ) 50 MG tablet Take 1 tablet (50 mg total) by mouth 2 (two) times daily.   furosemide  (LASIX ) 20 MG tablet Take 1 tablet (20 mg total) by mouth daily.   metoprolol  succinate (TOPROL  XL) 25 MG 24 hr tablet Take 0.5 tablets (12.5 mg total) by mouth daily.   Mometasone  Furoate (ASMANEX  HFA) 200 MCG/ACT AERO Inhale two puffs twice daily to prevent cough or wheeze. Rinse mouth after use.   Multiple Vitamin (MULTI-VITAMIN DAILY PO) Take 1 tablet by mouth daily.   pantoprazole  (PROTONIX ) 40 MG tablet Take one tablet twice daily as directed   potassium chloride  (MICRO-K ) 10 MEQ CR capsule Take 1 capsule (10 mEq total) by mouth daily.   telmisartan  (MICARDIS ) 40 MG tablet Take 1 tablet (40 mg total) by mouth daily.   No facility-administered encounter medications on file as of 04/26/2024.     Review of Systems  Review of Systems  N/a Physical Exam  BP 137/78   Pulse 62   Temp 97.6 F (36.4 C) (Oral)   Ht 5' 2 (1.575 m)   Wt 147 lb 9.6 oz (67 kg)   SpO2 97%   BMI 27.00 kg/m   Wt  Readings from Last 5 Encounters:  04/26/24 147 lb 9.6 oz (67 kg)  04/20/24 147 lb 6.4 oz (66.9 kg)  04/14/24 146 lb 3.2 oz (66.3 kg)  03/17/24 146 lb 12.8 oz (66.6 kg)  03/14/24 149 lb 3.2 oz (67.7 kg)    BMI Readings from Last 5 Encounters:  04/26/24 27.00 kg/m  04/20/24 26.96 kg/m  04/14/24 26.74 kg/m  03/17/24 26.85 kg/m  03/14/24 27.29 kg/m     Physical Exam General: well appearing, in NAD Eyes: EOMI, no icterus Neck: supple, no JVP Pulm: Normal work of breathing, clear  CV: Warm, no edema Abd: ND, BS present MSK: No synovitis, No joint effusion Neuro: Normal gait, no weakness Psych: Normal mood, full affect  Assessment & Plan:    Obstructive sleep apnea: On recent polysomnography at home.  She is using CPAP nightly, auto titrating.   AHI 3.2. She is currently benefiting clinically from ongoing CPAP use.  Chronic cough: Present since the 1990s.  Suspect will be exceedingly difficult to treat given duration of symptoms.  Ongoing work-up and treatment per allergist. Not significantly improved on ICS.  Most likely reflux related given her hiatal hernia on imaging.  Especially with lack of response to other therapies.     Return in about 1 year (around 04/26/2025) for f/u Dr. Annella.   Donnice JONELLE Annella, MD 04/26/2024

## 2024-05-18 ENCOUNTER — Other Ambulatory Visit: Payer: Self-pay | Admitting: Allergy and Immunology

## 2024-05-30 ENCOUNTER — Other Ambulatory Visit: Payer: Self-pay | Admitting: Cardiology

## 2024-05-31 NOTE — Progress Notes (Unsigned)
 Cardiology Office Note:    Date:  06/01/2024   ID:  Karen Snyder, DOB 1940/10/31, MRN [id number]  PCP:  Silver Lamar LABOR, MD  Cardiologist:  Redell Leiter, MD    Referring MD: Silver Lamar LABOR, MD    ASSESSMENT:    1. Atypical atrial flutter (HCC)   2. High risk medication use   3. Chronic anticoagulation   4. Hypertensive heart disease with heart failure (HCC)    PLAN:    In order of problems listed above:  She initiated flecainide  has done well but never stopped Multaq  she will stop it today. Continue flutter and long-acting calcium channel blocker beta-blocker Continue her anticoagulant Hypertension is well-controlled with her current medications including ARB continue the same   Next appointment: I will plan to see her in 6 months well I was going to   Medication Adjustments/Labs and Tests Ordered: Current medicines are reviewed at length with the patient today.  Concerns regarding medicines are outlined above.  Orders Placed This Encounter  Procedures   EKG 12-Lead   No orders of the defined types were placed in this encounter.    History of Present Illness:    Karen Snyder is a 83 y.o. female with a hx of atypical atrial flutter maintaining sinus rhythm presently on flecainide  chronic anticoagulation hypertensive heart disease with heart failure obstructive sleep apnea on CPAP and recurrent hematuria last seen 04/14/2024.  Compliance with diet, lifestyle and medications: We initiated flecainide  unfortunately she never stopped Multaq  she has not had an adverse reaction She had short episodes of atrial fibrillation 05/07/2024 05/24/2024 05/25/2024 but since then has done well With atrial fibrillation rates in the 130s she takes a short acting beta-blocker with relief Unfortunately she stopped her exercise program and I asked her to get back to walking I think it is beneficial She had no bleeding with her anticoagulant Past Medical History:  Diagnosis Date   A-fib  (HCC)    Asthma    Atrial fibrillation with RVR (HCC) 02/04/2021   Blepharitis of upper and lower eyelids of both eyes 07/19/2019   Bronchitis 06/20/2019   Capsulitis of metatarsophalangeal (MTP) joint of left foot 07/01/2016   Dependent edema 06/20/2019   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/19/2019   Environmental and seasonal allergies 04/15/2018   Essential hypertension 04/15/2018   Exertional dyspnea 06/20/2019   GERD (gastroesophageal reflux disease)    GERD without esophagitis 04/15/2018   High blood pressure    High risk medication use 10/23/2023   Lumbar back pain 04/04/2021   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   Mild mitral regurgitation 10/23/2023   Mild persistent asthma without complication 04/15/2018   Peripheral drusen, bilateral 07/19/2019   Postmenopausal bleeding 08/23/2015   Rhinosinusitis 01/11/2020   Screening for colon cancer 08/15/2019   Tachycardia 01/24/2021   TMJ tenderness, right 08/15/2019    Current Medications: Current Meds  Medication Sig   albuterol  (PROVENTIL  HFA) 108 (90 Base) MCG/ACT inhaler Inhale two puffs every four to six hours as needed for cough or wheeze.   apixaban  (ELIQUIS ) 5 MG TABS tablet Take 1 tablet (5 mg total) by mouth 2 (two) times daily.   Azelastine HCl (ASTEPRO) 0.15 % SOLN Place 1 spray into both nostrils at bedtime.   BIOTIN FORTE PO Take 1 tablet by mouth daily.   calcium-vitamin D (OSCAL WITH D) 500-200 MG-UNIT tablet Take 1 tablet by mouth daily.   diltiazem  (DILACOR XR ) 120 MG 24 hr capsule  Take 1 capsule (120 mg total) by mouth daily.   doxylamine, Sleep, (UNISOM) 25 MG tablet Take 25 mg by mouth at bedtime as needed.   ferrous sulfate 325 (65 FE) MG EC tablet Take 325 mg by mouth daily with breakfast.   flecainide  (TAMBOCOR ) 50 MG tablet Take 1 tablet (50 mg total) by mouth 2 (two) times daily.   furosemide  (LASIX ) 20 MG tablet Take 1 tablet (20 mg total) by mouth daily.   metoprolol   succinate (TOPROL  XL) 25 MG 24 hr tablet Take 0.5 tablets (12.5 mg total) by mouth daily.   Mometasone  Furoate (ASMANEX  HFA) 200 MCG/ACT AERO INHALE 2 PUFFS BY MOUTH TWICE DAILY TO PREVENT COUGH OR WHEEZE. RINSE MOUTH AFTER USE   Multiple Vitamin (MULTI-VITAMIN DAILY PO) Take 1 tablet by mouth daily.   pantoprazole  (PROTONIX ) 40 MG tablet Take one tablet twice daily as directed   potassium chloride  (MICRO-K ) 10 MEQ CR capsule Take 1 capsule (10 mEq total) by mouth daily.   telmisartan  (MICARDIS ) 40 MG tablet Take 1 tablet (40 mg total) by mouth daily.   [DISCONTINUED] diltiazem  (CARDIZEM  CD) 120 MG 24 hr capsule Take 120 mg by mouth daily.      EKGs/Labs/Other Studies Reviewed:    The following studies were reviewed today:  Cardiac Studies & Procedures   ______________________________________________________________________________________________     ECHOCARDIOGRAM  ECHOCARDIOGRAM COMPLETE 11/17/2023  Narrative ECHOCARDIOGRAM REPORT    Patient Name:   Karen Snyder Date of Exam: 11/17/2023 Medical Rec #:  [id number]  Height:       63.0 in Accession #:    [id number] Weight:       153.6 lb Date of Birth:  1941/05/02 BSA:          1.728 m Patient Age:    82 years   BP:           122/58 mmHg Patient Gender: F          HR:           67 bpm. Exam Location:  [place]  Procedure: 2D Echo, Color Doppler, Cardiac Doppler and Strain Analysis (Both Spectral and Color Flow Doppler were utilized during procedure).  Indications:    High risk medication use [Z79.899 (ICD-10-CM)]; Paroxysmal atrial fibrillation (HCC) [I48.0 (ICD-10-CM)]; Essential hypertension [I10 (ICD-10-CM)]; Mild mitral regurgitation [I34.0 (ICD-10-CM)]  History:        Patient has prior history of Echocardiogram examinations, most recent 03/11/2021. Arrythmias:Atrial Fibrillation, Signs/Symptoms:Dyspnea; Risk Factors:Hypertension.  Sonographer:    Charlie Jointer RDCS Referring Phys: [id number] ALEAN SAUNDERS  MADIREDDY  IMPRESSIONS   1. Left ventricular ejection fraction, by estimation, is 60 to 65%. The left ventricle has normal function. The left ventricle has no regional wall motion abnormalities. Left ventricular diastolic parameters are indeterminate. The average left ventricular global longitudinal strain is -16.6 %. The global longitudinal strain is normal. 2. Right ventricular systolic function is normal. The right ventricular size is normal. There is mildly elevated pulmonary artery systolic pressure. 3. Left atrial size was mild to moderately dilated. 4. The mitral valve is normal in structure. Mild mitral valve regurgitation. No evidence of mitral stenosis. 5. The aortic valve is normal in structure. Aortic valve regurgitation is not visualized. No aortic stenosis is present. 6. The inferior vena cava is normal in size with greater than 50% respiratory variability, suggesting right atrial pressure of 3 mmHg.  FINDINGS Left Ventricle: Left ventricular ejection fraction, by estimation, is 60 to 65%. The left ventricle has normal function. The left  ventricle has no regional wall motion abnormalities. The average left ventricular global longitudinal strain is -16.6 %. Strain was performed and the global longitudinal strain is normal. The left ventricular internal cavity size was normal in size. There is no left ventricular hypertrophy. Left ventricular diastolic parameters are indeterminate.  Right Ventricle: The right ventricular size is normal. No increase in right ventricular wall thickness. Right ventricular systolic function is normal. There is mildly elevated pulmonary artery systolic pressure. The tricuspid regurgitant velocity is 3.12 m/s, and with an assumed right atrial pressure of 3 mmHg, the estimated right ventricular systolic pressure is 41.9 mmHg.  Left Atrium: Left atrial size was mild to moderately dilated.  Right Atrium: Right atrial size was normal in size.  Pericardium:  There is no evidence of pericardial effusion.  Mitral Valve: The mitral valve is normal in structure. Mild mitral valve regurgitation. No evidence of mitral valve stenosis.  Tricuspid Valve: The tricuspid valve is normal in structure. Tricuspid valve regurgitation is trivial. No evidence of tricuspid stenosis.  Aortic Valve: The aortic valve is normal in structure. Aortic valve regurgitation is not visualized. No aortic stenosis is present.  Pulmonic Valve: The pulmonic valve was normal in structure. Pulmonic valve regurgitation is not visualized. No evidence of pulmonic stenosis.  Aorta: The aortic root is normal in size and structure.  Venous: The inferior vena cava is normal in size with greater than 50% respiratory variability, suggesting right atrial pressure of 3 mmHg.  IAS/Shunts: No atrial level shunt detected by color flow Doppler.   LEFT VENTRICLE PLAX 2D LVIDd:         4.20 cm   Diastology LVIDs:         2.40 cm   LV e' medial:    6.96 cm/s LV PW:         1.00 cm   LV E/e' medial:  20.7 LV IVS:        0.95 cm   LV e' lateral:   7.18 cm/s LVOT diam:     1.75 cm   LV E/e' lateral: 20.1 LV SV:         50 LV SV Index:   29        2D Longitudinal Strain LVOT Area:     2.41 cm  2D Strain GLS Avg:     -16.6 %   RIGHT VENTRICLE             IVC RV Basal diam:  3.00 cm     IVC diam: 2.00 cm RV Mid diam:    2.60 cm RV S prime:     16.85 cm/s TAPSE (M-mode): 2.5 cm  LEFT ATRIUM             Index        RIGHT ATRIUM           Index LA diam:        5.00 cm 2.89 cm/m   RA Area:     12.80 cm LA Vol (A2C):   53.7 ml 31.07 ml/m  RA Volume:   29.50 ml  17.07 ml/m LA Vol (A4C):   70.7 ml 40.90 ml/m LA Biplane Vol: 61.6 ml 35.64 ml/m AORTIC VALVE LVOT Vmax:   100.30 cm/s LVOT Vmean:  69.233 cm/s LVOT VTI:    0.209 m  AORTA Ao Root diam: 2.80 cm Ao Asc diam:  2.60 cm Ao Desc diam: 2.00 cm  MITRAL VALVE  TRICUSPID VALVE MV Area (PHT): 3.21 cm       TR  Peak grad:   38.9 mmHg MV Decel Time: 236 msec       TR Vmax:        312.00 cm/s MR Peak grad:    81.7 mmHg MR Vmax:         452.00 cm/s  SHUNTS MR PISA:         0.57 cm     Systemic VTI:  0.21 m MR PISA Eff ROA: 5 mm        Systemic Diam: 1.75 cm MR PISA Radius:  0.30 cm MV E velocity: 144.00 cm/s  Lamar Fitch MD Electronically signed by Lamar Fitch MD Signature Date/Time: 11/17/2023/3:14:10 PM    Final          ______________________________________________________________________________________________      EKG Interpretation Date/Time:  Wednesday June 01 2024 14:24:11 EST Ventricular Rate:  66 PR Interval:    QRS Duration:  86 QT Interval:  450 QTC Calculation: 471 R Axis:   118  Text Interpretation: SRTH first degree AV block No significant change since Right axis deviation When compared with ECG of 20-Apr-2024 14:06, No significant change since last tracing Confirmed by Monetta Rogue ([id number]) on 06/01/2024 2:26:09 PM   Recent Labs: 03/14/2024: BUN 18; Creatinine, Ser 1.21; Hemoglobin 13.0; NT-Pro BNP 511; Platelets 249; Potassium 4.6; Sodium 140  Recent Lipid Panel No results found for: CHOL, TRIG, HDL, CHOLHDL, VLDL, LDLCALC, LDLDIRECT  Physical Exam:    VS:  BP 130/60   Pulse 66   Ht 5' 2 (1.575 m)   Wt 150 lb 9.6 oz (68.3 kg)   SpO2 94%   BMI 27.55 kg/m     Wt Readings from Last 3 Encounters:  06/01/24 150 lb 9.6 oz (68.3 kg)  04/26/24 147 lb 9.6 oz (67 kg)  04/20/24 147 lb 6.4 oz (66.9 kg)     GEN:  Well nourished, well developed in no acute distress HEENT: Normal NECK: No JVD; No carotid bruits LYMPHATICS: No lymphadenopathy CARDIAC: RRR, no murmurs, rubs, gallops RESPIRATORY:  Clear to auscultation without rales, wheezing or rhonchi  ABDOMEN: Soft, non-tender, non-distended MUSCULOSKELETAL:  No edema; No deformity  SKIN: Warm and dry NEUROLOGIC:  Alert and oriented x 3 PSYCHIATRIC:  Normal affect     Signed, Rogue Monetta, MD  06/01/2024 2:49 PM    [facility]

## 2024-06-01 ENCOUNTER — Ambulatory Visit: Attending: Cardiology | Admitting: Cardiology

## 2024-06-01 ENCOUNTER — Encounter: Payer: Self-pay | Admitting: Cardiology

## 2024-06-01 VITALS — BP 130/60 | HR 66 | Ht 62.0 in | Wt 150.6 lb

## 2024-06-01 DIAGNOSIS — I484 Atypical atrial flutter: Secondary | ICD-10-CM

## 2024-06-01 DIAGNOSIS — Z7901 Long term (current) use of anticoagulants: Secondary | ICD-10-CM | POA: Diagnosis not present

## 2024-06-01 DIAGNOSIS — I110 Hypertensive heart disease with heart failure: Secondary | ICD-10-CM

## 2024-06-01 DIAGNOSIS — G4733 Obstructive sleep apnea (adult) (pediatric): Secondary | ICD-10-CM

## 2024-06-01 DIAGNOSIS — Z79899 Other long term (current) drug therapy: Secondary | ICD-10-CM | POA: Diagnosis not present

## 2024-06-01 MED ORDER — METOPROLOL TARTRATE 25 MG PO TABS: 25.0000 mg | ORAL_TABLET | Freq: Every day | ORAL | 3 refills | Status: DC | PRN

## 2024-06-01 NOTE — Patient Instructions (Addendum)
 Medication Instructions:  Your physician has recommended you make the following change in your medication:   STOP: Multaq  START: Metoprolol  tartrate 25 mg daily as needed for heart rate greater than 110 bpm.  *If you need a refill on your cardiac medications before your next appointment, please call your pharmacy*  Lab Work: None If you have labs (blood work) drawn today and your tests are completely normal, you will receive your results only by: MyChart Message (if you have MyChart) OR A paper copy in the mail If you have any lab test that is abnormal or we need to change your treatment, we will call you to review the results.  Testing/Procedures: None  Follow-Up: At [facility], you and your health needs are our priority.  As part of our continuing mission to provide you with exceptional heart care, our providers are all part of one team.  This team includes your primary Cardiologist (physician) and Advanced Practice Providers or APPs (Physician Assistants and Nurse Practitioners) who all work together to provide you with the care you need, when you need it.  Your next appointment:   6 month(s)  Provider:   Redell Leiter, MD    We recommend signing up for the patient portal called MyChart.  Sign up information is provided on this After Visit Summary.  MyChart is used to connect with patients for Virtual Visits (Telemedicine).  Patients are able to view lab/test results, encounter notes, upcoming appointments, etc.  Non-urgent messages can be sent to your provider as well.   To learn more about what you can do with MyChart, go to [email or web address].   Other Instructions None

## 2024-06-05 ENCOUNTER — Encounter: Payer: Self-pay | Admitting: Cardiology

## 2024-06-05 DIAGNOSIS — I480 Paroxysmal atrial fibrillation: Secondary | ICD-10-CM

## 2024-06-05 DIAGNOSIS — I484 Atypical atrial flutter: Secondary | ICD-10-CM

## 2024-06-13 ENCOUNTER — Encounter: Payer: Self-pay | Admitting: Cardiology

## 2024-06-15 ENCOUNTER — Other Ambulatory Visit: Payer: Self-pay

## 2024-06-15 ENCOUNTER — Ambulatory Visit: Attending: Cardiology

## 2024-06-15 VITALS — BP 120/84 | HR 124 | Resp 20 | Wt 150.0 lb

## 2024-06-15 DIAGNOSIS — I484 Atypical atrial flutter: Secondary | ICD-10-CM | POA: Diagnosis not present

## 2024-06-15 MED ORDER — METOPROLOL SUCCINATE ER 25 MG PO TB24: 12.5000 mg | ORAL_TABLET | Freq: Two times a day (BID) | ORAL | 3 refills | Status: DC

## 2024-06-15 NOTE — Progress Notes (Signed)
    Nurse Visit   Date of Encounter: 06/15/2024 ID: Karen Snyder, DOB January 27, 1941, MRN [id number]  PCP:  Silver Lamar LABOR, MD   [facility] Providers Cardiologist:  None      Visit Details   VS:  BP 120/84 (BP Location: Right Arm, Patient Position: Sitting, Cuff Size: Normal)   Pulse (!) 124   Resp 20   Wt 150 lb (68 kg)   SpO2 98%   BMI 27.44 kg/m  , BMI Body mass index is 27.44 kg/m.  Wt Readings from Last 3 Encounters:  06/15/24 150 lb (68 kg)  06/01/24 150 lb 9.6 oz (68.3 kg)  04/26/24 147 lb 9.6 oz (67 kg)     Reason for visit: Perform EKG and review medications Performed today: Vitals, EKG, Education and Provider consulted. Changes (medications, testing, etc.) : START: Toprol12.5 mg two times daily. Consult the EP group for the first available appointment. Patient states that she has an appointment to see the EP doctor on 07/04/24. Call on Monday 06/20/24 and let us  know how your heart rate is doing. Length of Visit: 25 minutes    Medications Adjustments/Labs and Tests Ordered: Orders Placed This Encounter  Procedures   EKG 12-Lead   No orders of the defined types were placed in this encounter.    Signed, Charlie LILLETTE Free, RN  06/15/2024 5:14 PM

## 2024-06-15 NOTE — Patient Instructions (Signed)
 SABRA

## 2024-06-20 ENCOUNTER — Ambulatory Visit

## 2024-06-20 ENCOUNTER — Encounter: Payer: Self-pay | Admitting: Cardiology

## 2024-06-20 VITALS — BP 120/80 | HR 140 | Wt 153.4 lb

## 2024-06-20 DIAGNOSIS — I480 Paroxysmal atrial fibrillation: Secondary | ICD-10-CM

## 2024-06-20 DIAGNOSIS — I484 Atypical atrial flutter: Secondary | ICD-10-CM

## 2024-06-20 NOTE — Patient Instructions (Signed)
   Nurse Visit   Date of Encounter: 06/20/2024 ID: Karen Snyder, DOB 1940/08/12, MRN [id number]  PCP:  Silver Lamar LABOR, MD   [facility] Providers Cardiologist:  Dr. Kolleen     Visit Details   VS:  BP 120/80   Pulse (!) 140   Wt 153 lb 6.4 oz (69.6 kg)   SpO2 98%   BMI 28.06 kg/m  , BMI Body mass index is 28.06 kg/m.  Wt Readings from Last 3 Encounters:  06/20/24 153 lb 6.4 oz (69.6 kg)  06/15/24 150 lb (68 kg)  06/01/24 150 lb 9.6 oz (68.3 kg)     Reason for visit: Tachycardia Performed today: Vitals, EKG, and Provider consulted:recommended patient be evaluated in the hospital Changes (medications, testing, etc.) : None Length of Visit: 20 minutes    Medications Adjustments/Labs and Tests Ordered: Orders Placed This Encounter  Procedures   EKG 12-Lead   No orders of the defined types were placed in this encounter.    Signed, Alan LITTIE Leff, RN  06/20/2024 10:54 AM

## 2024-06-21 DIAGNOSIS — I10 Essential (primary) hypertension: Secondary | ICD-10-CM | POA: Diagnosis not present

## 2024-06-21 DIAGNOSIS — I4892 Unspecified atrial flutter: Secondary | ICD-10-CM | POA: Diagnosis not present

## 2024-06-21 DIAGNOSIS — Z7901 Long term (current) use of anticoagulants: Secondary | ICD-10-CM | POA: Diagnosis not present

## 2024-06-22 DIAGNOSIS — I4891 Unspecified atrial fibrillation: Secondary | ICD-10-CM | POA: Diagnosis not present

## 2024-06-22 DIAGNOSIS — I4892 Unspecified atrial flutter: Secondary | ICD-10-CM | POA: Diagnosis not present

## 2024-06-22 DIAGNOSIS — I4581 Long QT syndrome: Secondary | ICD-10-CM | POA: Diagnosis not present

## 2024-06-22 DIAGNOSIS — I10 Essential (primary) hypertension: Secondary | ICD-10-CM | POA: Diagnosis not present

## 2024-06-22 DIAGNOSIS — Z7901 Long term (current) use of anticoagulants: Secondary | ICD-10-CM | POA: Diagnosis not present

## 2024-06-23 DIAGNOSIS — I10 Essential (primary) hypertension: Secondary | ICD-10-CM | POA: Diagnosis not present

## 2024-06-23 DIAGNOSIS — I4892 Unspecified atrial flutter: Secondary | ICD-10-CM | POA: Diagnosis not present

## 2024-06-23 DIAGNOSIS — I4891 Unspecified atrial fibrillation: Secondary | ICD-10-CM | POA: Diagnosis not present

## 2024-06-23 DIAGNOSIS — Z7901 Long term (current) use of anticoagulants: Secondary | ICD-10-CM | POA: Diagnosis not present

## 2024-06-24 ENCOUNTER — Telehealth: Payer: Self-pay | Admitting: *Deleted

## 2024-06-24 ENCOUNTER — Ambulatory Visit: Attending: Cardiology

## 2024-06-24 DIAGNOSIS — Z7901 Long term (current) use of anticoagulants: Secondary | ICD-10-CM | POA: Diagnosis not present

## 2024-06-24 DIAGNOSIS — I4891 Unspecified atrial fibrillation: Secondary | ICD-10-CM | POA: Diagnosis not present

## 2024-06-24 DIAGNOSIS — I480 Paroxysmal atrial fibrillation: Secondary | ICD-10-CM

## 2024-06-24 DIAGNOSIS — I10 Essential (primary) hypertension: Secondary | ICD-10-CM | POA: Diagnosis not present

## 2024-06-24 DIAGNOSIS — R001 Bradycardia, unspecified: Secondary | ICD-10-CM | POA: Diagnosis not present

## 2024-06-24 DIAGNOSIS — I4892 Unspecified atrial flutter: Secondary | ICD-10-CM | POA: Diagnosis not present

## 2024-06-24 NOTE — Telephone Encounter (Signed)
 14 Day Zio ordered by Dr. Vevelyn for Atrial Fibrillation

## 2024-06-27 NOTE — Progress Notes (Signed)
 [facility] Transitional Care Management     Situation   Karen Snyder is a 83 y.o. female who was contacted today for a transitional care outreach.  Admission Date:06/21/24   Discharge Date:06/24/24    Institution: [facility]   Diagnosis:  atrial fibrillation with rapid ventricular response, history of heart failure, sleep apnea, anemia, asthma    Is this visit eligible for TCM? Yes  Background  06/27/24 Initial TCM outreach x 1st attempt. HN called patient home # [phone] and spoke with patient briefly. She was going out to the car they were going to get breakfast. HN will try again around 1:00 pm for follow up   06/27/24 Initial TCM outreach x 2nd attempt. HN called patient home # [phone] and spoke with patient.    Since Discharge: HN received notification that patient was discharged from [facility] on 06/24/24 for atrial fibrillation with rapid ventricular response, history of heart failure, sleep apnea, anemia, asthma. HN spoke with patient and she reports the other day she seen cardiologist before Thanksgiving and he changed some of her medicine.  She reports that she sent message to cardiologist office about her HR but then went by the office and they did EKG and sent her to ED.  She reports they got her HR down and then later that day it went back up and went back to hospital. She reports when she left the hospital she went by cardiologist and they got her hooked up with heart monitor that she is wearing for 2 weeks.  She reports that right now her HR is 111. She said she don't really have any symptoms when in atrial fibrillation. She already has an apt with electrophysiologist on Dec 15 @ 10:00 am to be seen.  She denies any headache, dizziness. She said she is just tired out. She don't get a lot of sleep at night and is on CPAP machine. She has a chronic cough, but denies any fever, chills, chest pain or shortness of breath. She is eating and drinking good and no issues with  bowels or bladder. She reports they cut her Lasix  to every other day. She reports none of the medicines were called into the pharmacy for her and they called hospital and they said it was called in twice.  She reports really nothing changed that she don't have prescriptions already for. She reports that the flecainide  you have to break in half to get 75 mg bid, but she is taking all her medicines as directed.  HN notified her if needed to get PCP to call in prescription could get him to, but she said she has enough to last till she sees the electrophysiologist on 12/15. Patient is independent with ADL's and doesn't use any assistive device to ambulate and no falls in the last 6 months.   No other issues or concerns. HN will follow up with patient over the next 30 days.  HN notified patient to call PCP/specialist if any new issues or concerns.     I reviewed current EMR med list and facility discharge medications. Please see note in EMR med list for findings.  New Medicines: Telmisartan  40 mg 1 po every day Lasix  20 mg 1 po 2 every day Flecainide  75 mg 1 po bid Metoprolol  Succinate 25 mg 1 po every day  Diltiazem  CD 120 mg 1 po every day   Discontinued : Telmisartan  40 mg po every day  Lasix  20 mg 1 every  day  Multivitamin 1 po every day Metoprolol  Succinate 25 mg po bid  Diltiazem  HCL 120 mg po every am  Flecainide  50 mg bid   Primary Care Provider on Record: Lamar DELENA Simpers, MD   Assessment    General Assessment     Type of Visit Telephone   Assessment Completed With Patient   Interpreter Used No   Preferred Language English   Living Arrangement Spouse   Support System Spouse   Type of Residence Private residence   Home Care Services No   Does Patient Have DME No   Does Patient Need DME No   Bed or Wheelchair Confined No   Fall History No      Flowsheet Row Most Recent Value  Engagement   Call number [phone]  Two calls made/Contact successful Yes  Is the patient eligible?  Yes * [RH, see chart review or media tab for hospital discharge summary]  Call Start Time 1025  Medications   Appointments   Self Management   Patient Teaching   Wrap Up   Two calls made/Contact successful Yes   List of Assessments:  General Assessment:  What type of visit: Telephone   Assessment completed with: Patient Was an interpreter used?: No Language Preferred: English   Living arrangement:: Spouse Support system:: Spouse Type of residence:: Private residence Home care services:: No Does the patient have DME: No   Does the patient need DME: No   Bed or wheelchair confined:: No History of falls in last 6 months:: No       Recommendation    PCP/specialist notified: No  Referral Made: No  Referrals made to other disciplines: None   Future Appointments  Date Time Provider Department Center  08/30/2024 12:50 PM Comer Pillow Tsamis, MD [facility] [id number]    Jon Sharps, RN [place] Navigator [phone]     Electronically signed by: Jon Earnie Sharps, RN 06/27/2024 1:41 PM    *Some images could not be shown.

## 2024-07-01 NOTE — Progress Notes (Unsigned)
 Electrophysiology Office Note:   Date:  07/04/2024  ID:  Karen Snyder, DOB [birth year], MRN [id number]  Primary Cardiologist: Redell Leiter, MD Primary Heart Failure: None Electrophysiologist: Jobina Maita Gladis Norton, MD      History of Present Illness:   Karen Snyder is a 83 y.o. female with h/o atrial flutter, atrial fibrillation, hypertension, obstructive sleep apnea seen today for  for Electrophysiology evaluation of atrial flutter at the request of Redell Leiter.    She was initially on Multaq  for atrial fibrillation/flutter.  She then developed persistent atrial fibrillation and was started on flecainide .    Discussed the use of AI scribe software for clinical note transcription with the patient, who gave verbal consent to proceed.  History of Present Illness Karen Snyder is an 83 year old female with atrial fibrillation who presents with concerns about her heart rhythm and medication management.  She feels discouraged due to her fluctuating heart rhythm, which affects her ability to exercise. Her heart rate sometimes becomes high, causing fear of exercising. She recalls that around October 12th her heart rhythm seemed to settle down, but the following day she experienced problems again.  She underwent a cardioversion on December 5th and recalls being in normal rhythm for two days before her symptoms returned. She experiences fatigue and occasional shortness of breath, particularly during exertion.  She is currently on telmisartan , furosemide  (Lasix ), flecainide . There has been confusion regarding her medication orders post-hospital discharge, particularly with the dosage of flecainide  and the discontinuation of Multaq , which she was not informed about. She has been taking a 75 mg dose of flecainide .  She has a history of being on amiodarone , which was discontinued due to a suspected adverse effect on her oxygen levels, leading to a switch to Multaq . She recalls an episode where her oxygen level  dropped to 85% while walking, which was attributed to the medication.  She is concerned about the management of her medications and the delays in receiving them from the pharmacy. She mentions having enough medication to last until December 19th. No pain or heart-related symptoms, but she feels tired and occasionally short of breath, especially during exertion.   Review of systems complete and found to be negative unless listed in HPI.   EP Information / Studies Reviewed:    EKG is ordered today. Personal review as below.       Risk Assessment/Calculations:    CHA2DS2-VASc Score =     This indicates a  % annual risk of stroke. The patient's score is based upon:             Physical Exam:   VS:  BP 118/70 (BP Location: Left Arm, Patient Position: Sitting, Cuff Size: Normal)   Pulse (!) 118   Ht 5' 2 (1.575 m)   Wt 147 lb (66.7 kg)   SpO2 98%   BMI 26.89 kg/m    Wt Readings from Last 3 Encounters:  07/04/24 147 lb (66.7 kg)  06/20/24 153 lb 6.4 oz (69.6 kg)  06/15/24 150 lb (68 kg)     GEN: Well nourished, well developed in no acute distress NECK: No JVD; No carotid bruits CARDIAC: Tachycardic, no murmurs, rubs, gallops RESPIRATORY:  Clear to auscultation without rales, wheezing or rhonchi  ABDOMEN: Soft, non-tender, non-distended EXTREMITIES:  No edema; No deformity   ASSESSMENT AND PLAN:    1.  Persistent atrial fibrillation/atypical atrial flutter: On flecainide .  She is in atrial tachycardia today.  She is on flecainide  75 mg twice daily.  She would benefit from further rhythm control as she is symptomatic with fatigue and shortness of breath.  She would prefer rhythm control.  We discussed increasing flecainide  and cardioversion versus ablation versus dofetilide.  At this point, she would prefer to avoid procedures and hospital admission.  Karen Snyder increase flecainide  and cardiovert.  2.  Secondary hypercoagulable state: On Eliquis   3.  Hypertension:  Well-controlled  4.  Obstructive sleep apnea: CPAP compliance encouraged  Follow up with EP Team in 3 months  Signed, Isobel Eisenhuth Gladis Norton, MD

## 2024-07-01 NOTE — H&P (View-Only) (Signed)
 " Electrophysiology Office Note:   Date:  07/04/2024  ID:  Karen Snyder, DOB 28-Oct-1940, MRN [id number]  Primary Cardiologist: Redell Leiter, MD Primary Heart Failure: None Electrophysiologist: Cyana Shook Gladis Norton, MD      History of Present Illness:   Karen Snyder is a 83 y.o. female with h/o atrial flutter, atrial fibrillation, hypertension, obstructive sleep apnea seen today for  for Electrophysiology evaluation of atrial flutter at the request of Redell Leiter.    She was initially on Multaq  for atrial fibrillation/flutter.  She then developed persistent atrial fibrillation and was started on flecainide .    Discussed the use of AI scribe software for clinical note transcription with the patient, who gave verbal consent to proceed.  History of Present Illness Karen Snyder is an 83 year old female with atrial fibrillation who presents with concerns about her heart rhythm and medication management.  She feels discouraged due to her fluctuating heart rhythm, which affects her ability to exercise. Her heart rate sometimes becomes high, causing fear of exercising. She recalls that around October 12th her heart rhythm seemed to settle down, but the following day she experienced problems again.  She underwent a cardioversion on December 5th and recalls being in normal rhythm for two days before her symptoms returned. She experiences fatigue and occasional shortness of breath, particularly during exertion.  She is currently on telmisartan , furosemide  (Lasix ), flecainide . There has been confusion regarding her medication orders post-hospital discharge, particularly with the dosage of flecainide  and the discontinuation of Multaq , which she was not informed about. She has been taking a 75 mg dose of flecainide .  She has a history of being on amiodarone , which was discontinued due to a suspected adverse effect on her oxygen levels, leading to a switch to Multaq . She recalls an episode where her oxygen level  dropped to 85% while walking, which was attributed to the medication.  She is concerned about the management of her medications and the delays in receiving them from the pharmacy. She mentions having enough medication to last until December 19th. No pain or heart-related symptoms, but she feels tired and occasionally short of breath, especially during exertion.   Review of systems complete and found to be negative unless listed in HPI.   EP Information / Studies Reviewed:    EKG is ordered today. Personal review as below.       Risk Assessment/Calculations:    CHA2DS2-VASc Score =     This indicates a  % annual risk of stroke. The patient's score is based upon:             Physical Exam:   VS:  BP 118/70 (BP Location: Left Arm, Patient Position: Sitting, Cuff Size: Normal)   Pulse (!) 118   Ht 5' 2 (1.575 m)   Wt 147 lb (66.7 kg)   SpO2 98%   BMI 26.89 kg/m    Wt Readings from Last 3 Encounters:  07/04/24 147 lb (66.7 kg)  06/20/24 153 lb 6.4 oz (69.6 kg)  06/15/24 150 lb (68 kg)     GEN: Well nourished, well developed in no acute distress NECK: No JVD; No carotid bruits CARDIAC: Tachycardic, no murmurs, rubs, gallops RESPIRATORY:  Clear to auscultation without rales, wheezing or rhonchi  ABDOMEN: Soft, non-tender, non-distended EXTREMITIES:  No edema; No deformity   ASSESSMENT AND PLAN:    1.  Persistent atrial fibrillation/atypical atrial flutter: On flecainide .  She is in atrial tachycardia today.  She is on flecainide  75 mg twice  daily.  She would benefit from further rhythm control as she is symptomatic with fatigue and shortness of breath.  She would prefer rhythm control.  We discussed increasing flecainide  and cardioversion versus ablation versus dofetilide.  At this point, she would prefer to avoid procedures and hospital admission.  Karen Snyder increase flecainide  and cardiovert.  2.  Secondary hypercoagulable state: On Eliquis   3.  Hypertension:  Well-controlled  4.  Obstructive sleep apnea: CPAP compliance encouraged  Follow up with EP Team in 3 months  Signed, Kenia Teagarden Gladis Norton, MD  "

## 2024-07-04 ENCOUNTER — Encounter: Payer: Self-pay | Admitting: Cardiology

## 2024-07-04 ENCOUNTER — Ambulatory Visit: Admitting: Cardiology

## 2024-07-04 VITALS — BP 118/70 | HR 118 | Ht 62.0 in | Wt 147.0 lb

## 2024-07-04 DIAGNOSIS — I10 Essential (primary) hypertension: Secondary | ICD-10-CM | POA: Diagnosis not present

## 2024-07-04 DIAGNOSIS — G4733 Obstructive sleep apnea (adult) (pediatric): Secondary | ICD-10-CM | POA: Diagnosis not present

## 2024-07-04 DIAGNOSIS — Z01812 Encounter for preprocedural laboratory examination: Secondary | ICD-10-CM | POA: Diagnosis not present

## 2024-07-04 DIAGNOSIS — I484 Atypical atrial flutter: Secondary | ICD-10-CM | POA: Diagnosis not present

## 2024-07-04 DIAGNOSIS — I480 Paroxysmal atrial fibrillation: Secondary | ICD-10-CM | POA: Diagnosis not present

## 2024-07-04 DIAGNOSIS — D6869 Other thrombophilia: Secondary | ICD-10-CM

## 2024-07-04 MED ORDER — FLECAINIDE ACETATE 100 MG PO TABS: 100.0000 mg | ORAL_TABLET | Freq: Two times a day (BID) | ORAL | 3 refills | Status: DC

## 2024-07-04 NOTE — Patient Instructions (Signed)
 Medication Instructions:  Your physician has recommended you make the following change in your medication:  INCREASE Flecainide  to 100 mg twice a day  *If you need a refill on your cardiac medications before your next appointment, please call your pharmacy*  Lab Work: Today: BMET & CBC  If you have any lab test that is abnormal or we need to change your treatment, we will call you to review the results.  Testing/Procedures: Your physician has recommended that you have a Cardioversion (DCCV). Electrical Cardioversion uses a jolt of electricity to your heart either through paddles or wired patches attached to your chest. This is a controlled, usually prescheduled, procedure. Defibrillation is done under light anesthesia in the hospital, and you usually go home the day of the procedure. This is done to get your heart back into a normal rhythm. You are not awake for the procedure. Please see the instruction sheet below located under other instructions.  Follow-Up: At [facility], you and your health needs are our priority.  As part of our continuing mission to provide you with exceptional heart care, our providers are all part of one team.  This team includes your primary Cardiologist (physician) and Advanced Practice Providers or APPs (Physician Assistants and Nurse Practitioners) who all work together to provide you with the care you need, when you need it.  Your next appointment:   3 month(s)  Provider:   Soyla Norton, MD     Thank you for choosing [facility]!!   Maeola Domino, RN [phone]   Other Instructions      Dear Karen Snyder  You are scheduled for a Cardioversion on Monday, December 29 with Dr. Raford.  Please arrive at the [facility] (Main Entrance A) at [facility]: [address] [place], [place] [address] at 9:00 AM (This time is 2 hour(s) before your procedure to ensure your preparation).   Free valet parking service is available. You will  check in at ADMITTING.   *Please Note: You will receive a call the day before your procedure to confirm the appointment time. That time may have changed from the original time based on the schedule for that day.*    DIET:  Nothing to eat or drink after midnight except a sip of water with medications (see medication instructions below)  MEDICATION INSTRUCTIONS: !!IF ANY NEW MEDICATIONS ARE STARTED AFTER TODAY, PLEASE NOTIFY YOUR PROVIDER AS SOON AS POSSIBLE!!  FYI: Medications such as Semaglutide (Ozempic, Wegovy), Tirzepatide (Mounjaro, Zepbound), Dulaglutide (Trulicity), etc (GLP1 agonists) AND Canagliflozin (Invokana), Dapagliflozin (Farxiga), Empagliflozin (Jardiance), Ertugliflozin (Steglatro), Bexagliflozin [employer]) or any combination with one of these drugs such as Invokamet (Canagliflozin/Metformin), Synjardy (Empagliflozin/Metformin), etc (SGLT2 inhibitors) must be held around the time of a procedure. This is not a comprehensive list of all of these drugs. Please review all of your medications and talk to your provider if you take any one of these. If you are not sure, ask your provider.   You may take your morning medications with sips of water --- MAKE SURE TO TAKE YOUR ELIQUIS .  DO NOT TAKE YOUR LASIX    Continue taking your anticoagulant (blood thinner): Apixaban  (Eliquis ).  You will need to continue this after your procedure until you are told by your provider that it is safe to stop.    LABS:   Come to [place] lab on 07/04/25  FYI:  For your safety, and to allow us  to monitor your vital signs accurately during the surgery/procedure we request: If  you have artificial nails, gel coating, SNS etc, please have those removed prior to your surgery/procedure. Not having the nail coverings /polish removed may result in cancellation or delay of your surgery/procedure.  Your support person will be asked to wait in the waiting room during your procedure.  It is OK to have someone drop  you off and come back when you are ready to be discharged.  You cannot drive after the procedure and will need someone to drive you home.  Bring your insurance cards.  *Special Note: Every effort is made to have your procedure done on time. Occasionally there are emergencies that occur at the hospital that may cause delays. Please be patient if a delay does occur.

## 2024-07-04 NOTE — Addendum Note (Signed)
 Addended by: GRETEL MAEOLA CROME on: 07/04/2024 10:57 AM   Modules accepted: Orders

## 2024-07-05 LAB — CBC
Hematocrit: 43.5 % (ref 34.0–46.6)
Hemoglobin: 13.7 g/dL (ref 11.1–15.9)
MCH: 29.1 pg (ref 26.6–33.0)
MCHC: 31.5 g/dL (ref 31.5–35.7)
MCV: 93 fL (ref 79–97)
Platelets: 337 x10E3/uL (ref 150–450)
RBC: 4.7 x10E6/uL (ref 3.77–5.28)
RDW: 13.3 % (ref 11.7–15.4)
WBC: 7.2 x10E3/uL (ref 3.4–10.8)

## 2024-07-05 LAB — BASIC METABOLIC PANEL WITH GFR
BUN/Creatinine Ratio: 15 (ref 12–28)
BUN: 16 mg/dL (ref 8–27)
CO2: 24 mmol/L (ref 20–29)
Calcium: 9.8 mg/dL (ref 8.7–10.3)
Chloride: 100 mmol/L (ref 96–106)
Creatinine, Ser: 1.07 mg/dL — ABNORMAL HIGH (ref 0.57–1.00)
Glucose: 99 mg/dL (ref 70–99)
Potassium: 4.4 mmol/L (ref 3.5–5.2)
Sodium: 140 mmol/L (ref 134–144)
eGFR: 51 mL/min/1.73 — ABNORMAL LOW (ref >59)

## 2024-07-07 ENCOUNTER — Other Ambulatory Visit: Payer: Self-pay | Admitting: Cardiology

## 2024-07-18 ENCOUNTER — Encounter (HOSPITAL_COMMUNITY)
Admission: RE | Disposition: A | Discharge status: Home / Self Care | Payer: Self-pay | Source: Home / Self Care | Attending: Cardiology

## 2024-07-18 ENCOUNTER — Ambulatory Visit (HOSPITAL_COMMUNITY): Admitting: Anesthesiology

## 2024-07-18 ENCOUNTER — Ambulatory Visit (HOSPITAL_COMMUNITY)
Admission: RE | Admit: 2024-07-18 | Discharge: 2024-07-18 | Disposition: A | Discharge status: Home / Self Care | Attending: Cardiology | Admitting: Cardiology

## 2024-07-18 ENCOUNTER — Other Ambulatory Visit: Payer: Self-pay

## 2024-07-18 ENCOUNTER — Encounter (HOSPITAL_COMMUNITY): Payer: Self-pay | Admitting: Cardiology

## 2024-07-18 DIAGNOSIS — G4733 Obstructive sleep apnea (adult) (pediatric): Secondary | ICD-10-CM | POA: Diagnosis not present

## 2024-07-18 DIAGNOSIS — I484 Atypical atrial flutter: Secondary | ICD-10-CM | POA: Insufficient documentation

## 2024-07-18 DIAGNOSIS — Z7901 Long term (current) use of anticoagulants: Secondary | ICD-10-CM | POA: Diagnosis not present

## 2024-07-18 DIAGNOSIS — I10 Essential (primary) hypertension: Secondary | ICD-10-CM

## 2024-07-18 DIAGNOSIS — I4819 Other persistent atrial fibrillation: Secondary | ICD-10-CM | POA: Insufficient documentation

## 2024-07-18 DIAGNOSIS — I4719 Other supraventricular tachycardia: Secondary | ICD-10-CM | POA: Diagnosis not present

## 2024-07-18 DIAGNOSIS — D6869 Other thrombophilia: Secondary | ICD-10-CM | POA: Diagnosis not present

## 2024-07-18 DIAGNOSIS — J45909 Unspecified asthma, uncomplicated: Secondary | ICD-10-CM

## 2024-07-18 DIAGNOSIS — Z79899 Other long term (current) drug therapy: Secondary | ICD-10-CM | POA: Insufficient documentation

## 2024-07-18 HISTORY — PX: CARDIOVERSION: EP1203

## 2024-07-18 SURGERY — CARDIOVERSION (CATH LAB)
Anesthesia: General

## 2024-07-18 MED ORDER — PROPOFOL 10 MG/ML IV BOLUS
INTRAVENOUS | Status: DC | PRN
  Administered 2024-07-18: 50 mg via INTRAVENOUS

## 2024-07-18 MED ORDER — SODIUM CHLORIDE 0.9 % IV SOLN: INTRAVENOUS | Status: DC

## 2024-07-18 SURGICAL SUPPLY — 1 items: PAD DEFIB RADIO PHYSIO CONN (PAD) ×1 IMPLANT

## 2024-07-18 NOTE — Discharge Instructions (Signed)

## 2024-07-18 NOTE — Transfer of Care (Signed)
 Immediate Anesthesia Transfer of Care Note  Patient: Karen Snyder  Procedure(s) Performed: CARDIOVERSION  Patient Location: PACU and Cath Lab  Anesthesia Type:General  Level of Consciousness: drowsy  Airway & Oxygen Therapy: Patient Spontanous Breathing and Patient connected to nasal cannula oxygen  Post-op Assessment: Report given to RN and Post -op Vital signs reviewed and stable  Post vital signs: Reviewed and stable  Last Vitals:  Vitals Value Taken Time  BP 103/55 07/18/24 11:29  Temp    Pulse 60 07/18/24 11:29  Resp 13 07/18/24 11:29  SpO2 97% 07/18/24 11:29  Vitals shown include unfiled device data.  Last Pain: There were no vitals filed for this visit.       Complications: There were no known notable events for this encounter.

## 2024-07-18 NOTE — Interval H&P Note (Signed)
 History and Physical Interval Note:  07/18/2024 11:16 AM  Karen Snyder  has presented today for surgery, with the diagnosis of afib.  The various methods of treatment have been discussed with the patient and family. After consideration of risks, benefits and other options for treatment, the patient has consented to  Procedures: CARDIOVERSION (N/A) as a surgical intervention.  The patient's history has been reviewed, patient examined, no change in status, stable for surgery.  I have reviewed the patient's chart and labs.  Questions were answered to the patient's satisfaction.     Devan Babino Lonni

## 2024-07-18 NOTE — Anesthesia Preprocedure Evaluation (Signed)
"                                    Anesthesia Evaluation  Patient identified by MRN, date of birth, ID band Patient awake    Reviewed: Allergy & Precautions, NPO status , Patient's Chart, lab work & pertinent test results, reviewed documented beta blocker date and time   Airway Mallampati: II  TM Distance: >3 FB Neck ROM: Full    Dental no notable dental hx.    Pulmonary asthma    Pulmonary exam normal breath sounds clear to auscultation       Cardiovascular hypertension, Pt. on home beta blockers and Pt. on medications Normal cardiovascular exam+ dysrhythmias (eliquis ) Atrial Fibrillation  Rhythm:Irregular Rate:Normal  TTE 2025  1. Left ventricular ejection fraction, by estimation, is 60 to 65%. The  left ventricle has normal function. The left ventricle has no regional  wall motion abnormalities. Left ventricular diastolic parameters are  indeterminate. The average left  ventricular global longitudinal strain is -16.6 %. The global longitudinal  strain is normal.   2. Right ventricular systolic function is normal. The right ventricular  size is normal. There is mildly elevated pulmonary artery systolic  pressure.   3. Left atrial size was mild to moderately dilated.   4. The mitral valve is normal in structure. Mild mitral valve  regurgitation. No evidence of mitral stenosis.   5. The aortic valve is normal in structure. Aortic valve regurgitation is  not visualized. No aortic stenosis is present.   6. The inferior vena cava is normal in size with greater than 50%  respiratory variability, suggesting right atrial pressure of 3 mmHg.      Neuro/Psych negative neurological ROS  negative psych ROS   GI/Hepatic Neg liver ROS,GERD  ,,  Endo/Other  negative endocrine ROS    Renal/GU negative Renal ROS  negative genitourinary   Musculoskeletal  (+) Arthritis ,    Abdominal   Peds  Hematology negative hematology ROS (+)   Anesthesia Other  Findings   Reproductive/Obstetrics                              Anesthesia Physical Anesthesia Plan  ASA: 3  Anesthesia Plan: General   Post-op Pain Management:    Induction: Intravenous  PONV Risk Score and Plan: Propofol  infusion and Treatment may vary due to age or medical condition  Airway Management Planned: Natural Airway  Additional Equipment:   Intra-op Plan:   Post-operative Plan:   Informed Consent: I have reviewed the patients History and Physical, chart, labs and discussed the procedure including the risks, benefits and alternatives for the proposed anesthesia with the patient or authorized representative who has indicated his/her understanding and acceptance.     Dental advisory given  Plan Discussed with: CRNA  Anesthesia Plan Comments:          Anesthesia Quick Evaluation  "

## 2024-07-18 NOTE — CV Procedure (Signed)
 Procedure:   DCCV  Indication:  Symptomatic atrial tachycardia/atypical flutter  Procedure Note:  The patient signed informed consent.  They have had had therapeutic anticoagulation with apixaban  greater than 3 weeks.  Anesthesia was administered by Dr. Niels.  Patient received 50 mg IV propofol .Adequate airway was maintained throughout and vital followed per protocol.  They were cardioverted x 1 with 200J of biphasic synchronized energy.  They converted to NSR.  There were no apparent complications.  The patient had normal neuro status and respiratory status post procedure with vitals stable as recorded elsewhere.    Follow up:  They will continue on current medical therapy and follow up with cardiology as scheduled.  Shelda Bruckner, MD PhD 07/18/2024 11:27 AM

## 2024-07-19 DIAGNOSIS — I480 Paroxysmal atrial fibrillation: Secondary | ICD-10-CM

## 2024-07-19 NOTE — Anesthesia Postprocedure Evaluation (Signed)
"   Anesthesia Post Note  Patient: Karen Snyder  Procedure(s) Performed: CARDIOVERSION     Patient location during evaluation: Cath Lab Anesthesia Type: General Level of consciousness: awake and alert Pain management: pain level controlled Vital Signs Assessment: post-procedure vital signs reviewed and stable Respiratory status: spontaneous breathing, nonlabored ventilation, respiratory function stable and patient connected to nasal cannula oxygen Cardiovascular status: blood pressure returned to baseline and stable Postop Assessment: no apparent nausea or vomiting Anesthetic complications: no   There were no known notable events for this encounter.  Last Vitals:  Vitals:   07/18/24 1142 07/18/24 1152  BP: 115/66 117/62  Pulse: 60 61  Resp: 17 19  Temp:    SpO2: 94% 98%    Last Pain:  Vitals:   07/18/24 1152  TempSrc:   PainSc: 0-No pain                 Dearl Rudden L Javeah Loeza      "

## 2024-07-20 NOTE — Progress Notes (Unsigned)
 " Cardiology Office Note:    Date:  07/25/2024   ID:  Karen Snyder, DOB [birth year], MRN [id number] Signs PCP:  Silver Lamar LABOR, MD  Cardiologist:  Redell Leiter, MD    Referring MD: Silver Lamar LABOR, MD    ASSESSMENT:    1. Paroxysmal atrial fibrillation (HCC)   2. Atypical atrial flutter (HCC)   3. Chronic anticoagulation   4. High risk medication use   5. Essential hypertension   6. OSA (obstructive sleep apnea)    PLAN:    In order of problems listed above:  For now maintaining atrial rhythm continue flecainide  beta-blocker calcium channel blocker will check a flecainide  level EKG does not shows 1C toxicity Certainly continue her anticoagulant BP at target Sleep apnea treated We have a firm plan that if she fails flecainide  therapy admission initiation of Tikosyn   Next appointment: 6 months   Medication Adjustments/Labs and Tests Ordered: Current medicines are reviewed at length with the patient today.  Concerns regarding medicines are outlined above.  Orders Placed This Encounter  Procedures   EKG 12-Lead   No orders of the defined types were placed in this encounter.    History of Present Illness:    Karen Snyder is a 83 y.o. female with a hx of atypical atrial flutter with chronic anticoagulation hypertensive heart disease with heart failure obstructive sleep apnea on CPAP and recurrent hematuria.  Last seen 06/01/2024.  She is admitted to [facility] with rapid atrial fibrillation rate controlled with IV Cardizem  and converted to sinus rhythm 06/24/2024.  Another cardioversion [facility] 07/18/2024 again to sinus rhythm.  Has been seen by EP with the recommendation of Tikosyn therapy if she again fails antiarrhythmic therapy.    Compliance with diet, lifestyle and medications: Yes  Since her last cardioversion she is maintained in atrial rhythm today looks ectopic atrial low right atrial  She remains very apprehensive and worried There is some  discrepancy of medications that ironed out She tolerates flecainide  without side effects has not had syncope palpitation shortness of breath chest pain Past Medical History:  Diagnosis Date   A-fib (HCC)    Asthma    Atrial fibrillation with RVR (HCC) 02/04/2021   Blepharitis of upper and lower eyelids of both eyes 07/19/2019   Bronchitis 06/20/2019   Capsulitis of metatarsophalangeal (MTP) joint of left foot 07/01/2016   Dependent edema 06/20/2019   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/19/2019   Environmental and seasonal allergies 04/15/2018   Essential hypertension 04/15/2018   Exertional dyspnea 06/20/2019   GERD (gastroesophageal reflux disease)    GERD without esophagitis 04/15/2018   High blood pressure    High risk medication use 10/23/2023   Lumbar back pain 04/04/2021   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   Mild mitral regurgitation 10/23/2023   Mild persistent asthma without complication 04/15/2018   Peripheral drusen, bilateral 07/19/2019   Postmenopausal bleeding 08/23/2015   Rhinosinusitis 01/11/2020   Screening for colon cancer 08/15/2019   Tachycardia 01/24/2021   TMJ tenderness, right 08/15/2019    Current Medications: Active Medications[1]    EKGs/Labs/Other Studies Reviewed:    The following studies were reviewed today:  Cardiac Studies & Procedures   ______________________________________________________________________________________________     ECHOCARDIOGRAM  ECHOCARDIOGRAM COMPLETE 11/17/2023  Narrative ECHOCARDIOGRAM REPORT    Patient Name:   Karen Snyder Date of Exam: 11/17/2023 Medical Rec #:  [id number]  Height:       63.0 in Accession #:  [id number] Weight:       153.6 lb Date of Birth:  27-Sep-1940 BSA:          1.728 m Patient Age:    15 years   BP:           122/58 mmHg Patient Gender: F          HR:           67 bpm. Exam Location:  [place]  Procedure: 2D Echo, Color Doppler, Cardiac Doppler and  Strain Analysis (Both Spectral and Color Flow Doppler were utilized during procedure).  Indications:    High risk medication use [Z79.899 (ICD-10-CM)]; Paroxysmal atrial fibrillation (HCC) [I48.0 (ICD-10-CM)]; Essential hypertension [I10 (ICD-10-CM)]; Mild mitral regurgitation [I34.0 (ICD-10-CM)]  History:        Patient has prior history of Echocardiogram examinations, most recent 03/11/2021. Arrythmias:Atrial Fibrillation, Signs/Symptoms:Dyspnea; Risk Factors:Hypertension.  Sonographer:    Charlie Jointer RDCS Referring Phys: [id number] ALEAN SAUNDERS MADIREDDY  IMPRESSIONS   1. Left ventricular ejection fraction, by estimation, is 60 to 65%. The left ventricle has normal function. The left ventricle has no regional wall motion abnormalities. Left ventricular diastolic parameters are indeterminate. The average left ventricular global longitudinal strain is -16.6 %. The global longitudinal strain is normal. 2. Right ventricular systolic function is normal. The right ventricular size is normal. There is mildly elevated pulmonary artery systolic pressure. 3. Left atrial size was mild to moderately dilated. 4. The mitral valve is normal in structure. Mild mitral valve regurgitation. No evidence of mitral stenosis. 5. The aortic valve is normal in structure. Aortic valve regurgitation is not visualized. No aortic stenosis is present. 6. The inferior vena cava is normal in size with greater than 50% respiratory variability, suggesting right atrial pressure of 3 mmHg.  FINDINGS Left Ventricle: Left ventricular ejection fraction, by estimation, is 60 to 65%. The left ventricle has normal function. The left ventricle has no regional wall motion abnormalities. The average left ventricular global longitudinal strain is -16.6 %. Strain was performed and the global longitudinal strain is normal. The left ventricular internal cavity size was normal in size. There is no left ventricular hypertrophy. Left  ventricular diastolic parameters are indeterminate.  Right Ventricle: The right ventricular size is normal. No increase in right ventricular wall thickness. Right ventricular systolic function is normal. There is mildly elevated pulmonary artery systolic pressure. The tricuspid regurgitant velocity is 3.12 m/s, and with an assumed right atrial pressure of 3 mmHg, the estimated right ventricular systolic pressure is 41.9 mmHg.  Left Atrium: Left atrial size was mild to moderately dilated.  Right Atrium: Right atrial size was normal in size.  Pericardium: There is no evidence of pericardial effusion.  Mitral Valve: The mitral valve is normal in structure. Mild mitral valve regurgitation. No evidence of mitral valve stenosis.  Tricuspid Valve: The tricuspid valve is normal in structure. Tricuspid valve regurgitation is trivial. No evidence of tricuspid stenosis.  Aortic Valve: The aortic valve is normal in structure. Aortic valve regurgitation is not visualized. No aortic stenosis is present.  Pulmonic Valve: The pulmonic valve was normal in structure. Pulmonic valve regurgitation is not visualized. No evidence of pulmonic stenosis.  Aorta: The aortic root is normal in size and structure.  Venous: The inferior vena cava is normal in size with greater than 50% respiratory variability, suggesting right atrial pressure of 3 mmHg.  IAS/Shunts: No atrial level shunt detected by color flow Doppler.   LEFT VENTRICLE PLAX 2D LVIDd:  4.20 cm   Diastology LVIDs:         2.40 cm   LV e' medial:    6.96 cm/s LV PW:         1.00 cm   LV E/e' medial:  20.7 LV IVS:        0.95 cm   LV e' lateral:   7.18 cm/s LVOT diam:     1.75 cm   LV E/e' lateral: 20.1 LV SV:         50 LV SV Index:   29        2D Longitudinal Strain LVOT Area:     2.41 cm  2D Strain GLS Avg:     -16.6 %   RIGHT VENTRICLE             IVC RV Basal diam:  3.00 cm     IVC diam: 2.00 cm RV Mid diam:    2.60 cm RV S  prime:     16.85 cm/s TAPSE (M-mode): 2.5 cm  LEFT ATRIUM             Index        RIGHT ATRIUM           Index LA diam:        5.00 cm 2.89 cm/m   RA Area:     12.80 cm LA Vol (A2C):   53.7 ml 31.07 ml/m  RA Volume:   29.50 ml  17.07 ml/m LA Vol (A4C):   70.7 ml 40.90 ml/m LA Biplane Vol: 61.6 ml 35.64 ml/m AORTIC VALVE LVOT Vmax:   100.30 cm/s LVOT Vmean:  69.233 cm/s LVOT VTI:    0.209 m  AORTA Ao Root diam: 2.80 cm Ao Asc diam:  2.60 cm Ao Desc diam: 2.00 cm  MITRAL VALVE                  TRICUSPID VALVE MV Area (PHT): 3.21 cm       TR Peak grad:   38.9 mmHg MV Decel Time: 236 msec       TR Vmax:        312.00 cm/s MR Peak grad:    81.7 mmHg MR Vmax:         452.00 cm/s  SHUNTS MR PISA:         0.57 cm     Systemic VTI:  0.21 m MR PISA Eff ROA: 5 mm        Systemic Diam: 1.75 cm MR PISA Radius:  0.30 cm MV E velocity: 144.00 cm/s  Lamar Fitch MD Electronically signed by Lamar Fitch MD Signature Date/Time: 11/17/2023/3:14:10 PM    Final    MONITORS  LONG TERM MONITOR (3-14 DAYS) 07/15/2024  Narrative Patch Wear Time:  13 days and 17 hours (2025-12-05T14:47:32-0500 to 2025-12-19T08:17:38-0500)  Patient had a min HR of 48 bpm, max HR of 137 bpm, and avg HR of 99 bpm. Predominant underlying rhythm was Atrial Flutter. Atrial Flutter occurred (87% burden), ranging from 67-137 bpm (avg of 105 bpm), the longest lasting 11 days 23 hours with an avg rate of 105 bpm. Atrial Fibrillation was present at de-activation of device. Isolated SVEs were rare (<1.0%), SVE Couplets were rare (<1.0%), and SVE Triplets were rare (<1.0%). Isolated VEs were rare (<1.0%), VE Couplets were rare (<1.0%), and no VE Triplets were present. Ventricular Trigeminy was present.  Summary and conclusions: Predominant rhythm was interpreted as atrial flutter with average heart rate of 105  ranging from 67-237 total burden 87% longest episode 11-day 23 hours however diagnosis is  somewhat difficult to entertain.  P wave is somewhat difficult to see it could be very well supraventricular tachycardia, asymptomatic       ______________________________________________________________________________________________      EKG Interpretation Date/Time:  Monday July 25 2024 10:41:20 EST Ventricular Rate:  89 PR Interval:  148 QRS Duration:  92 QT Interval:  408 QTC Calculation: 496 R Axis:   133  Text Interpretation: Ectopic atrial rhythm Right axis deviation Prolonged QT When compared with ECG of 18-Jul-2024 11:32, Ectopic atrial rhythm has replaced Sinus rhythm Vent. rate has increased BY  29 BPM QRS axis Shifted right Confirmed by Monetta Rogue ([id number]) on 07/25/2024 10:45:30 AM   Recent Labs: 03/14/2024: NT-Pro BNP 511 07/04/2024: BUN 16; Creatinine, Ser 1.07; Hemoglobin 13.7; Platelets 337; Potassium 4.4; Sodium 140  Recent Lipid Panel No results found for: CHOL, TRIG, HDL, CHOLHDL, VLDL, LDLCALC, LDLDIRECT  Physical Exam:    VS:  BP 136/74   Pulse 89   Ht 5' 2 (1.575 m)   Wt 151 lb 9.6 oz (68.8 kg)   SpO2 97%   BMI 27.73 kg/m     Wt Readings from Last 3 Encounters:  07/25/24 151 lb 9.6 oz (68.8 kg)  07/18/24 146 lb 13.2 oz (66.6 kg)  07/04/24 147 lb (66.7 kg)     GEN:  Well nourished, well developed in no acute distress HEENT: Normal NECK: No JVD; No carotid bruits LYMPHATICS: No lymphadenopathy CARDIAC: RRR, no murmurs, rubs, gallops RESPIRATORY:  Clear to auscultation without rales, wheezing or rhonchi  ABDOMEN: Soft, non-tender, non-distended MUSCULOSKELETAL:  No edema; No deformity  SKIN: Warm and dry NEUROLOGIC:  Alert and oriented x 3 PSYCHIATRIC:  Normal affect    Signed, Rogue Monetta, MD  07/25/2024 11:02 AM    [facility]      [1]  Current Meds  Medication Sig   Acetaminophen (TYLENOL PO) Take 1,000 mg by mouth daily as needed. Rapid Release   albuterol  (PROVENTIL  HFA) 108 (90 Base)  MCG/ACT inhaler Inhale two puffs every four to six hours as needed for cough or wheeze.   apixaban  (ELIQUIS ) 5 MG TABS tablet Take 1 tablet (5 mg total) by mouth 2 (two) times daily.   Azelastine HCl (ASTEPRO) 0.15 % SOLN Place 1 spray into both nostrils at bedtime.   BIOTIN FORTE PO Take 1 tablet by mouth daily.   CALCIUM CARBONATE-VITAMIN D PO Take 1 each by mouth daily. chewables   diltiazem  (DILACOR XR ) 120 MG 24 hr capsule Take 1 capsule (120 mg total) by mouth daily.   doxylamine, Sleep, (UNISOM) 25 MG tablet Take 25 mg by mouth at bedtime as needed for sleep.   ferrous sulfate 325 (65 FE) MG EC tablet Take 325 mg by mouth daily with breakfast.   flecainide  (TAMBOCOR ) 100 MG tablet Take 1 tablet (100 mg total) by mouth 2 (two) times daily.   furosemide  (LASIX ) 20 MG tablet Take 1 tablet (20 mg total) by mouth daily. (Patient taking differently: Take 20 mg by mouth every other day.)   metoprolol  succinate (TOPROL -XL) 25 MG 24 hr tablet Take 25 mg by mouth.   Mometasone  Furoate (ASMANEX  HFA) 200 MCG/ACT AERO INHALE 2 PUFFS BY MOUTH TWICE DAILY TO PREVENT COUGH OR WHEEZE. RINSE MOUTH AFTER USE   Multiple Vitamin (MULTI-VITAMIN DAILY PO) Take 1 tablet by mouth daily.   pantoprazole  (PROTONIX ) 40 MG tablet Take one tablet twice  daily as directed   potassium chloride  (MICRO-K ) 10 MEQ CR capsule Take 1 capsule (10 mEq total) by mouth daily.   PSYLLIUM PO Take by mouth daily at 12 noon. 1-2 teaspoonfuls daily (powder)   telmisartan  (MICARDIS ) 40 MG tablet Take 1 tablet (40 mg total) by mouth daily.   "

## 2024-07-25 ENCOUNTER — Encounter: Payer: Self-pay | Admitting: Cardiology

## 2024-07-25 ENCOUNTER — Ambulatory Visit: Attending: Cardiology | Admitting: Cardiology

## 2024-07-25 VITALS — BP 136/74 | HR 89 | Ht 62.0 in | Wt 151.6 lb

## 2024-07-25 DIAGNOSIS — I480 Paroxysmal atrial fibrillation: Secondary | ICD-10-CM | POA: Diagnosis not present

## 2024-07-25 DIAGNOSIS — Z7901 Long term (current) use of anticoagulants: Secondary | ICD-10-CM | POA: Diagnosis not present

## 2024-07-25 DIAGNOSIS — I484 Atypical atrial flutter: Secondary | ICD-10-CM | POA: Diagnosis not present

## 2024-07-25 DIAGNOSIS — G4733 Obstructive sleep apnea (adult) (pediatric): Secondary | ICD-10-CM

## 2024-07-25 DIAGNOSIS — I10 Essential (primary) hypertension: Secondary | ICD-10-CM | POA: Diagnosis not present

## 2024-07-25 DIAGNOSIS — Z79899 Other long term (current) drug therapy: Secondary | ICD-10-CM | POA: Diagnosis not present

## 2024-07-25 MED ORDER — POTASSIUM CHLORIDE CRYS ER 10 MEQ PO TBCR: 10.0000 meq | EXTENDED_RELEASE_TABLET | ORAL | 3 refills | Status: AC

## 2024-07-25 MED ORDER — FUROSEMIDE 20 MG PO TABS: 20.0000 mg | ORAL_TABLET | ORAL | 3 refills | Status: AC

## 2024-07-25 MED ORDER — METOPROLOL SUCCINATE ER 25 MG PO TB24: 25.0000 mg | ORAL_TABLET | Freq: Every day | ORAL | 3 refills | Status: AC

## 2024-07-25 NOTE — Patient Instructions (Signed)
 Medication Instructions:  Your physician has recommended you make the following change in your medication:   START: Metoprolol  succinate 25 mg daily START: Furosemide  20 mg every other day START: Potasium chloride 10 meq every other day  *If you need a refill on your cardiac medications before your next appointment, please call your pharmacy*  Lab Work: Your physician recommends that you return for lab work in:   Labs today: Flecainide  level  If you have labs (blood work) drawn today and your tests are completely normal, you will receive your results only by: MyChart Message (if you have MyChart) OR A paper copy in the mail If you have any lab test that is abnormal or we need to change your treatment, we will call you to review the results.  Testing/Procedures: None  Follow-Up: At [facility], you and your health needs are our priority.  As part of our continuing mission to provide you with exceptional heart care, our providers are all part of one team.  This team includes your primary Cardiologist (physician) and Advanced Practice Providers or APPs (Physician Assistants and Nurse Practitioners) who all work together to provide you with the care you need, when you need it.  Your next appointment:   6 month(s)  Provider:   Redell Leiter, MD    We recommend signing up for the patient portal called MyChart.  Sign up information is provided on this After Visit Summary.  MyChart is used to connect with patients for Virtual Visits (Telemedicine).  Patients are able to view lab/test results, encounter notes, upcoming appointments, etc.  Non-urgent messages can be sent to your provider as well.   To learn more about what you can do with MyChart, go to [email or web address].   Other Instructions None

## 2024-08-01 ENCOUNTER — Ambulatory Visit: Payer: Self-pay

## 2024-08-01 LAB — FLECAINIDE LEVEL: Flecainide: 0.9 ug/mL (ref 0.20–1.00)

## 2024-08-01 NOTE — Telephone Encounter (Signed)
 Pt requesting a c/b from the nurse

## 2024-08-02 ENCOUNTER — Other Ambulatory Visit: Payer: Self-pay

## 2024-08-02 MED ORDER — FLECAINIDE ACETATE 100 MG PO TABS: ORAL_TABLET | ORAL | 3 refills | Status: AC

## 2024-09-19 ENCOUNTER — Ambulatory Visit: Admitting: Allergy and Immunology

## 2024-09-19 ENCOUNTER — Ambulatory Visit: Admitting: Cardiology
